# Patient Record
Sex: Female | Born: 1940 | Race: Black or African American | Hispanic: No | Marital: Single | Smoking: Former smoker
Health system: Southern US, Community
[De-identification: ages and names within clinical notes are randomized; demographics above are authoritative.]

## PROBLEM LIST (undated history)

## (undated) DIAGNOSIS — K2100 Gastro-esophageal reflux disease with esophagitis, without bleeding: Secondary | ICD-10-CM

## (undated) DIAGNOSIS — J301 Allergic rhinitis due to pollen: Secondary | ICD-10-CM

## (undated) DIAGNOSIS — K5730 Diverticulosis of large intestine without perforation or abscess without bleeding: Secondary | ICD-10-CM

## (undated) DIAGNOSIS — R197 Diarrhea, unspecified: Secondary | ICD-10-CM

## (undated) DIAGNOSIS — E785 Hyperlipidemia, unspecified: Secondary | ICD-10-CM

## (undated) DIAGNOSIS — H6120 Impacted cerumen, unspecified ear: Secondary | ICD-10-CM

## (undated) DIAGNOSIS — I10 Essential (primary) hypertension: Secondary | ICD-10-CM

## (undated) DIAGNOSIS — M25569 Pain in unspecified knee: Secondary | ICD-10-CM

## (undated) DIAGNOSIS — K5909 Other constipation: Secondary | ICD-10-CM

## (undated) DIAGNOSIS — E538 Deficiency of other specified B group vitamins: Secondary | ICD-10-CM

## (undated) DIAGNOSIS — L309 Dermatitis, unspecified: Secondary | ICD-10-CM

## (undated) DIAGNOSIS — R109 Unspecified abdominal pain: Secondary | ICD-10-CM

## (undated) DIAGNOSIS — G8929 Other chronic pain: Secondary | ICD-10-CM

## (undated) DIAGNOSIS — E1165 Type 2 diabetes mellitus with hyperglycemia: Secondary | ICD-10-CM

## (undated) DIAGNOSIS — F411 Generalized anxiety disorder: Secondary | ICD-10-CM

## (undated) DIAGNOSIS — L408 Other psoriasis: Secondary | ICD-10-CM

## (undated) DIAGNOSIS — M4000 Postural kyphosis, site unspecified: Secondary | ICD-10-CM

## (undated) DIAGNOSIS — M25519 Pain in unspecified shoulder: Secondary | ICD-10-CM

## (undated) DIAGNOSIS — S43006A Unspecified dislocation of unspecified shoulder joint, initial encounter: Secondary | ICD-10-CM

## (undated) DIAGNOSIS — M2560 Stiffness of unspecified joint, not elsewhere classified: Secondary | ICD-10-CM

## (undated) DIAGNOSIS — E119 Type 2 diabetes mellitus without complications: Secondary | ICD-10-CM

## (undated) DIAGNOSIS — Z2089 Contact with and (suspected) exposure to other communicable diseases: Secondary | ICD-10-CM

## (undated) DIAGNOSIS — F17200 Nicotine dependence, unspecified, uncomplicated: Secondary | ICD-10-CM

## (undated) DIAGNOSIS — J209 Acute bronchitis, unspecified: Secondary | ICD-10-CM

## (undated) DIAGNOSIS — Z9181 History of falling: Secondary | ICD-10-CM

## (undated) DIAGNOSIS — J019 Acute sinusitis, unspecified: Secondary | ICD-10-CM

## (undated) DIAGNOSIS — M170 Bilateral primary osteoarthritis of knee: Secondary | ICD-10-CM

## (undated) DIAGNOSIS — E876 Hypokalemia: Secondary | ICD-10-CM

## (undated) DIAGNOSIS — T7840XA Allergy, unspecified, initial encounter: Secondary | ICD-10-CM

## (undated) DIAGNOSIS — F0390 Unspecified dementia without behavioral disturbance: Secondary | ICD-10-CM

## (undated) DIAGNOSIS — N72 Inflammatory disease of cervix uteri: Secondary | ICD-10-CM

## (undated) DIAGNOSIS — D72829 Elevated white blood cell count, unspecified: Secondary | ICD-10-CM

## (undated) DIAGNOSIS — F0280 Dementia in other diseases classified elsewhere without behavioral disturbance: Secondary | ICD-10-CM

## (undated) DIAGNOSIS — R160 Hepatomegaly, not elsewhere classified: Secondary | ICD-10-CM

## (undated) DIAGNOSIS — H903 Sensorineural hearing loss, bilateral: Secondary | ICD-10-CM

## (undated) DIAGNOSIS — B3731 Acute candidiasis of vulva and vagina: Secondary | ICD-10-CM

## (undated) DIAGNOSIS — M25511 Pain in right shoulder: Secondary | ICD-10-CM

## (undated) DIAGNOSIS — H9319 Tinnitus, unspecified ear: Secondary | ICD-10-CM

## (undated) DIAGNOSIS — IMO0001 Reserved for inherently not codable concepts without codable children: Secondary | ICD-10-CM

## (undated) DIAGNOSIS — Z01818 Encounter for other preprocedural examination: Secondary | ICD-10-CM

## (undated) DIAGNOSIS — L299 Pruritus, unspecified: Secondary | ICD-10-CM

## (undated) DIAGNOSIS — Z720 Tobacco use: Secondary | ICD-10-CM

## (undated) DIAGNOSIS — R059 Cough, unspecified: Secondary | ICD-10-CM

## (undated) DIAGNOSIS — M6281 Muscle weakness (generalized): Secondary | ICD-10-CM

## (undated) DIAGNOSIS — E109 Type 1 diabetes mellitus without complications: Secondary | ICD-10-CM

## (undated) DIAGNOSIS — M810 Age-related osteoporosis without current pathological fracture: Secondary | ICD-10-CM

## (undated) DIAGNOSIS — R05 Cough: Secondary | ICD-10-CM

## (undated) DIAGNOSIS — L409 Psoriasis, unspecified: Secondary | ICD-10-CM

## (undated) DIAGNOSIS — F0150 Vascular dementia without behavioral disturbance: Secondary | ICD-10-CM

## (undated) DIAGNOSIS — R5383 Other fatigue: Secondary | ICD-10-CM

## (undated) DIAGNOSIS — R1084 Generalized abdominal pain: Secondary | ICD-10-CM

## (undated) DIAGNOSIS — B373 Candidiasis of vulva and vagina: Secondary | ICD-10-CM

## (undated) DIAGNOSIS — K5900 Constipation, unspecified: Secondary | ICD-10-CM

## (undated) DIAGNOSIS — R52 Pain, unspecified: Secondary | ICD-10-CM

## (undated) DIAGNOSIS — R5381 Other malaise: Secondary | ICD-10-CM

## (undated) DIAGNOSIS — R269 Unspecified abnormalities of gait and mobility: Secondary | ICD-10-CM

## (undated) DIAGNOSIS — F419 Anxiety disorder, unspecified: Secondary | ICD-10-CM

## (undated) DIAGNOSIS — M2550 Pain in unspecified joint: Secondary | ICD-10-CM

## (undated) DIAGNOSIS — K219 Gastro-esophageal reflux disease without esophagitis: Secondary | ICD-10-CM

## (undated) DIAGNOSIS — M25512 Pain in left shoulder: Secondary | ICD-10-CM

## (undated) DIAGNOSIS — F4310 Post-traumatic stress disorder, unspecified: Secondary | ICD-10-CM

## (undated) HISTORY — DX: Nicotine dependence, unspecified, uncomplicated: F17.200

## (undated) HISTORY — DX: Sensorineural hearing loss, bilateral: H90.3

## (undated) HISTORY — DX: Pain in left shoulder: M25.512

## (undated) HISTORY — DX: Muscle weakness (generalized): M62.81

## (undated) HISTORY — DX: Diverticulosis of large intestine without perforation or abscess without bleeding: K57.30

## (undated) HISTORY — DX: Generalized abdominal pain: R10.84

## (undated) HISTORY — DX: Unspecified dementia, unspecified severity, without behavioral disturbance, psychotic disturbance, mood disturbance, and anxiety: F03.90

## (undated) HISTORY — DX: Acute bronchitis, unspecified: J20.9

## (undated) HISTORY — DX: Age-related osteoporosis without current pathological fracture: M81.0

## (undated) HISTORY — DX: Constipation, unspecified: K59.00

## (undated) HISTORY — DX: Unspecified abdominal pain: R10.9

## (undated) HISTORY — DX: Type 2 diabetes mellitus without complications: E11.9

## (undated) HISTORY — DX: Unspecified abnormalities of gait and mobility: R26.9

## (undated) HISTORY — DX: Hypercalcemia: E83.52

## (undated) HISTORY — DX: Tobacco use: Z72.0

## (undated) HISTORY — DX: Generalized anxiety disorder: F41.1

## (undated) HISTORY — DX: Pain in unspecified shoulder: M25.519

## (undated) HISTORY — DX: Type 1 diabetes mellitus without complications: E10.9

## (undated) HISTORY — DX: Gastro-esophageal reflux disease with esophagitis, without bleeding: K21.00

## (undated) HISTORY — DX: Post-traumatic stress disorder, unspecified: F43.10

## (undated) HISTORY — DX: Tinnitus, unspecified ear: H93.19

## (undated) HISTORY — DX: Bilateral primary osteoarthritis of knee: M17.0

## (undated) HISTORY — DX: Pruritus, unspecified: L29.9

## (undated) HISTORY — DX: Other malaise: R53.81

## (undated) HISTORY — DX: History of falling: Z91.81

## (undated) HISTORY — DX: Other chronic pain: G89.29

## (undated) HISTORY — DX: Pain in unspecified knee: M25.569

## (undated) HISTORY — DX: Reserved for inherently not codable concepts without codable children: IMO0001

## (undated) HISTORY — DX: Elevated white blood cell count, unspecified: D72.829

## (undated) HISTORY — DX: Pain in right shoulder: M25.511

## (undated) HISTORY — DX: Inflammatory disease of cervix uteri: N72

## (undated) HISTORY — DX: Pain, unspecified: R52

## (undated) HISTORY — DX: Contact with and (suspected) exposure to other communicable diseases: Z20.89

## (undated) HISTORY — DX: Vascular dementia, unspecified severity, without behavioral disturbance, psychotic disturbance, mood disturbance, and anxiety: F01.50

## (undated) HISTORY — DX: Type 2 diabetes mellitus with hyperglycemia: E11.65

## (undated) HISTORY — DX: Pain in unspecified joint: M25.50

## (undated) HISTORY — DX: Cough: R05

## (undated) HISTORY — DX: Essential (primary) hypertension: I10

## (undated) HISTORY — DX: Other fatigue: R53.83

## (undated) HISTORY — DX: Psoriasis, unspecified: L40.9

## (undated) HISTORY — DX: Impacted cerumen, unspecified ear: H61.20

## (undated) HISTORY — DX: Other psoriasis: L40.8

## (undated) HISTORY — PX: BLADDER SURGERY: SHX569

## (undated) HISTORY — DX: Diarrhea, unspecified: R19.7

## (undated) HISTORY — DX: Gastro-esophageal reflux disease without esophagitis: K21.9

## (undated) HISTORY — DX: Deficiency of other specified B group vitamins: E53.8

## (undated) HISTORY — DX: Postural kyphosis, site unspecified: M40.00

## (undated) HISTORY — DX: Encounter for other preprocedural examination: Z01.818

## (undated) HISTORY — DX: Other constipation: K59.09

## (undated) HISTORY — DX: Hyperlipidemia, unspecified: E78.5

## (undated) HISTORY — DX: Allergy, unspecified, initial encounter: T78.40XA

## (undated) HISTORY — DX: Allergic rhinitis due to pollen: J30.1

## (undated) HISTORY — DX: Dermatitis, unspecified: L30.9

## (undated) HISTORY — DX: Cough, unspecified: R05.9

## (undated) HISTORY — DX: Unspecified dislocation of unspecified shoulder joint, initial encounter: S43.006A

## (undated) HISTORY — DX: Stiffness of unspecified joint, not elsewhere classified: M25.60

## (undated) HISTORY — DX: Acute sinusitis, unspecified: J01.90

## (undated) HISTORY — DX: Hypokalemia: E87.6

## (undated) HISTORY — DX: Dementia in other diseases classified elsewhere, unspecified severity, without behavioral disturbance, psychotic disturbance, mood disturbance, and anxiety: F02.80

## (undated) HISTORY — DX: Hepatomegaly, not elsewhere classified: R16.0

## (undated) HISTORY — DX: Anxiety disorder, unspecified: F41.9

## (undated) HISTORY — PX: SPINE SURGERY: SHX786

## (undated) HISTORY — PX: OTHER SURGICAL HISTORY: SHX169

## (undated) HISTORY — DX: Candidiasis of vulva and vagina: B37.3

## (undated) HISTORY — DX: Acute candidiasis of vulva and vagina: B37.31

## (undated) HISTORY — DX: Gastro-esophageal reflux disease with esophagitis: K21.0

---

## 2007-07-13 ENCOUNTER — Emergency Department (HOSPITAL_COMMUNITY): Admission: EM | Admit: 2007-07-13 | Discharge: 2007-07-14 | Payer: Self-pay | Admitting: Emergency Medicine

## 2007-09-02 ENCOUNTER — Ambulatory Visit (HOSPITAL_COMMUNITY): Admission: RE | Admit: 2007-09-02 | Discharge: 2007-09-02 | Payer: Self-pay | Admitting: *Deleted

## 2007-10-06 ENCOUNTER — Emergency Department (HOSPITAL_COMMUNITY): Admission: EM | Admit: 2007-10-06 | Discharge: 2007-10-06 | Payer: Self-pay | Admitting: Emergency Medicine

## 2007-10-13 ENCOUNTER — Encounter: Admission: RE | Admit: 2007-10-13 | Discharge: 2007-10-13 | Payer: Self-pay | Admitting: General Surgery

## 2007-12-01 ENCOUNTER — Emergency Department (HOSPITAL_COMMUNITY): Admission: EM | Admit: 2007-12-01 | Discharge: 2007-12-01 | Payer: Self-pay | Admitting: Emergency Medicine

## 2007-12-25 ENCOUNTER — Emergency Department (HOSPITAL_COMMUNITY): Admission: EM | Admit: 2007-12-25 | Discharge: 2007-12-25 | Payer: Self-pay | Admitting: Emergency Medicine

## 2008-01-10 ENCOUNTER — Emergency Department (HOSPITAL_COMMUNITY): Admission: EM | Admit: 2008-01-10 | Discharge: 2008-01-10 | Payer: Self-pay | Admitting: Emergency Medicine

## 2008-06-12 ENCOUNTER — Encounter: Admission: RE | Admit: 2008-06-12 | Discharge: 2008-06-12 | Payer: Self-pay | Admitting: *Deleted

## 2008-09-18 ENCOUNTER — Emergency Department (HOSPITAL_COMMUNITY): Admission: EM | Admit: 2008-09-18 | Discharge: 2008-09-18 | Payer: Self-pay | Admitting: Emergency Medicine

## 2008-11-20 ENCOUNTER — Encounter: Admission: RE | Admit: 2008-11-20 | Discharge: 2008-11-20 | Payer: Self-pay | Admitting: *Deleted

## 2009-09-24 ENCOUNTER — Ambulatory Visit: Payer: Self-pay | Admitting: Internal Medicine

## 2009-09-24 DIAGNOSIS — R0602 Shortness of breath: Secondary | ICD-10-CM

## 2010-07-08 ENCOUNTER — Encounter (INDEPENDENT_AMBULATORY_CARE_PROVIDER_SITE_OTHER): Payer: Self-pay | Admitting: Emergency Medicine

## 2010-07-08 ENCOUNTER — Ambulatory Visit: Payer: Self-pay | Admitting: Vascular Surgery

## 2010-07-08 ENCOUNTER — Emergency Department (HOSPITAL_COMMUNITY): Admission: EM | Admit: 2010-07-08 | Discharge: 2010-07-08 | Payer: Self-pay | Admitting: Emergency Medicine

## 2010-08-22 ENCOUNTER — Inpatient Hospital Stay (HOSPITAL_COMMUNITY): Admission: EM | Admit: 2010-08-22 | Discharge: 2010-08-25 | Payer: Self-pay | Admitting: Emergency Medicine

## 2010-08-30 ENCOUNTER — Encounter: Admission: RE | Admit: 2010-08-30 | Discharge: 2010-08-30 | Payer: Self-pay | Admitting: Internal Medicine

## 2010-09-03 ENCOUNTER — Encounter: Admission: RE | Admit: 2010-09-03 | Discharge: 2010-09-03 | Payer: Self-pay | Admitting: Internal Medicine

## 2010-09-12 ENCOUNTER — Encounter: Admission: RE | Admit: 2010-09-12 | Discharge: 2010-09-12 | Payer: Self-pay | Admitting: Internal Medicine

## 2011-01-22 LAB — COMPREHENSIVE METABOLIC PANEL
ALT: 25 U/L (ref 0–35)
AST: 20 U/L (ref 0–37)
AST: 30 U/L (ref 0–37)
Albumin: 3.1 g/dL — ABNORMAL LOW (ref 3.5–5.2)
Albumin: 3.3 g/dL — ABNORMAL LOW (ref 3.5–5.2)
Alkaline Phosphatase: 77 U/L (ref 39–117)
Alkaline Phosphatase: 83 U/L (ref 39–117)
BUN: 12 mg/dL (ref 6–23)
BUN: 9 mg/dL (ref 6–23)
CO2: 28 mEq/L (ref 19–32)
Calcium: 9.2 mg/dL (ref 8.4–10.5)
Chloride: 96 mEq/L (ref 96–112)
Chloride: 99 mEq/L (ref 96–112)
Creatinine, Ser: 0.54 mg/dL (ref 0.4–1.2)
GFR calc Af Amer: >60 mL/min (ref >60)
GFR calc non Af Amer: >60 mL/min (ref >60)
Glucose, Bld: 105 mg/dL — ABNORMAL HIGH (ref 70–99)
Potassium: 4.2 mEq/L (ref 3.5–5.1)
Potassium: 4.9 mEq/L (ref 3.5–5.1)
Sodium: 129 mEq/L — ABNORMAL LOW (ref 135–145)
Total Bilirubin: 0.6 mg/dL (ref 0.3–1.2)
Total Bilirubin: 0.8 mg/dL (ref 0.3–1.2)
Total Protein: 6.8 g/dL (ref 6.0–8.3)

## 2011-01-22 LAB — DIFFERENTIAL
Basophils Absolute: 0 10*3/uL (ref 0.0–0.1)
Basophils Absolute: 0 10*3/uL (ref 0.0–0.1)
Basophils Relative: 0 % (ref 0–1)
Basophils Relative: 0 % (ref 0–1)
Eosinophils Absolute: 0 10*3/uL (ref 0.0–0.7)
Eosinophils Relative: 0 % (ref 0–5)
Eosinophils Relative: 1 % (ref 0–5)
Lymphocytes Relative: 54 % — ABNORMAL HIGH (ref 12–46)
Lymphs Abs: 5.4 10*3/uL — ABNORMAL HIGH (ref 0.7–4.0)
Monocytes Absolute: 0.5 10*3/uL (ref 0.1–1.0)
Monocytes Absolute: 0.6 10*3/uL (ref 0.1–1.0)
Monocytes Relative: 6 % (ref 3–12)
Neutro Abs: 4 10*3/uL (ref 1.7–7.7)
Neutro Abs: 4.1 10*3/uL (ref 1.7–7.7)
Neutrophils Relative %: 39 % — ABNORMAL LOW (ref 43–77)

## 2011-01-22 LAB — CBC
HCT: 40.7 % (ref 36.0–46.0)
Hemoglobin: 13.5 g/dL (ref 12.0–15.0)
Hemoglobin: 13.6 g/dL (ref 12.0–15.0)
MCH: 30.4 pg (ref 26.0–34.0)
MCHC: 33.3 g/dL (ref 30.0–36.0)
MCV: 91.2 fL (ref 78.0–100.0)
MCV: 91.5 fL (ref 78.0–100.0)
Platelets: 190 10*3/uL (ref 150–400)
Platelets: 195 10*3/uL (ref 150–400)
RBC: 4.34 MIL/uL (ref 3.87–5.11)
RBC: 4.46 MIL/uL (ref 3.87–5.11)
RDW: 15.7 % — ABNORMAL HIGH (ref 11.5–15.5)
WBC: 10.1 10*3/uL (ref 4.0–10.5)
WBC: 9.4 10*3/uL (ref 4.0–10.5)

## 2011-01-22 LAB — GLUCOSE, CAPILLARY
Glucose-Capillary: 101 mg/dL — ABNORMAL HIGH (ref 70–99)
Glucose-Capillary: 105 mg/dL — ABNORMAL HIGH (ref 70–99)
Glucose-Capillary: 124 mg/dL — ABNORMAL HIGH (ref 70–99)
Glucose-Capillary: 137 mg/dL — ABNORMAL HIGH (ref 70–99)

## 2011-01-22 LAB — MAGNESIUM: Magnesium: 2.5 mg/dL (ref 1.5–2.5)

## 2011-01-23 LAB — BASIC METABOLIC PANEL
BUN: 12 mg/dL (ref 6–23)
CO2: 29 mEq/L (ref 19–32)
Chloride: 87 mEq/L — ABNORMAL LOW (ref 96–112)
Glucose, Bld: 102 mg/dL — ABNORMAL HIGH (ref 70–99)
Potassium: 4.1 mEq/L (ref 3.5–5.1)

## 2011-01-23 LAB — GLUCOSE, CAPILLARY
Glucose-Capillary: 107 mg/dL — ABNORMAL HIGH (ref 70–99)
Glucose-Capillary: 168 mg/dL — ABNORMAL HIGH (ref 70–99)
Glucose-Capillary: 89 mg/dL (ref 70–99)
Glucose-Capillary: 98 mg/dL (ref 70–99)

## 2011-01-23 LAB — CBC
HCT: 40.3 % (ref 36.0–46.0)
Hemoglobin: 13.6 g/dL (ref 12.0–15.0)
MCH: 31 pg (ref 26.0–34.0)
MCHC: 33.8 g/dL (ref 30.0–36.0)
MCV: 91.6 fL (ref 78.0–100.0)

## 2011-01-23 LAB — URINALYSIS, ROUTINE W REFLEX MICROSCOPIC
Bilirubin Urine: NEGATIVE
Hgb urine dipstick: NEGATIVE
Ketones, ur: NEGATIVE mg/dL
Ketones, ur: NEGATIVE mg/dL
Nitrite: NEGATIVE
Nitrite: NEGATIVE
Protein, ur: NEGATIVE mg/dL
pH: 7 (ref 5.0–8.0)

## 2011-01-23 LAB — DIFFERENTIAL
Basophils Absolute: 0 10*3/uL (ref 0.0–0.1)
Eosinophils Absolute: 0 10*3/uL (ref 0.0–0.7)
Lymphocytes Relative: 53 % — ABNORMAL HIGH (ref 12–46)
Neutro Abs: 5 10*3/uL (ref 1.7–7.7)
Neutrophils Relative %: 42 % — ABNORMAL LOW (ref 43–77)

## 2011-01-23 LAB — COMPREHENSIVE METABOLIC PANEL
ALT: 22 U/L (ref 0–35)
CO2: 28 mEq/L (ref 19–32)
Calcium: 9.6 mg/dL (ref 8.4–10.5)
Creatinine, Ser: 0.52 mg/dL (ref 0.4–1.2)
GFR calc non Af Amer: >60 mL/min (ref >60)
Glucose, Bld: 102 mg/dL — ABNORMAL HIGH (ref 70–99)

## 2011-01-23 LAB — SEDIMENTATION RATE: Sed Rate: 2 mm/hr (ref 0–22)

## 2011-01-23 LAB — LIPID PANEL
Cholesterol: 143 mg/dL (ref 0–200)
LDL Cholesterol: 53 mg/dL (ref 0–99)
Total CHOL/HDL Ratio: 2.1 RATIO

## 2011-03-10 ENCOUNTER — Other Ambulatory Visit: Payer: Self-pay | Admitting: Internal Medicine

## 2011-03-10 DIAGNOSIS — Z780 Asymptomatic menopausal state: Secondary | ICD-10-CM

## 2011-03-12 ENCOUNTER — Other Ambulatory Visit: Payer: Self-pay

## 2011-03-19 ENCOUNTER — Ambulatory Visit
Admission: RE | Admit: 2011-03-19 | Discharge: 2011-03-19 | Disposition: A | Discharge status: Home / Self Care | Payer: Medicare Other | Source: Ambulatory Visit | Attending: Internal Medicine | Admitting: Internal Medicine

## 2011-03-19 DIAGNOSIS — Z780 Asymptomatic menopausal state: Secondary | ICD-10-CM

## 2011-07-31 LAB — I-STAT 8, (EC8 V) (CONVERTED LAB)
Acid-Base Excess: 2
Chloride: 96
HCT: 48 — ABNORMAL HIGH
Operator id: 146091
Potassium: 3.6
Sodium: 128 — ABNORMAL LOW

## 2011-07-31 LAB — DIFFERENTIAL
Basophils Absolute: 0
Basophils Relative: 0
Monocytes Relative: 5
Neutro Abs: 4.9
Neutrophils Relative %: 51

## 2011-07-31 LAB — POCT CARDIAC MARKERS
CKMB, poc: 1.8
Myoglobin, poc: 61.9

## 2011-07-31 LAB — BASIC METABOLIC PANEL
CO2: 24
Calcium: 9.7
Creatinine, Ser: 0.6
GFR calc Af Amer: >60
GFR calc non Af Amer: >60

## 2011-07-31 LAB — URINALYSIS, ROUTINE W REFLEX MICROSCOPIC
Bilirubin Urine: NEGATIVE
Glucose, UA: NEGATIVE
Ketones, ur: NEGATIVE
Urobilinogen, UA: 1
pH: 7

## 2011-07-31 LAB — CBC
MCHC: 32.9
RBC: 4.9

## 2011-07-31 LAB — URINE MICROSCOPIC-ADD ON

## 2011-07-31 LAB — B-NATRIURETIC PEPTIDE (CONVERTED LAB): Pro B Natriuretic peptide (BNP): <30

## 2011-08-04 ENCOUNTER — Other Ambulatory Visit: Payer: Self-pay | Admitting: Internal Medicine

## 2011-08-04 DIAGNOSIS — Z1231 Encounter for screening mammogram for malignant neoplasm of breast: Secondary | ICD-10-CM

## 2011-08-04 LAB — URINALYSIS, ROUTINE W REFLEX MICROSCOPIC
Ketones, ur: NEGATIVE
Nitrite: NEGATIVE
Protein, ur: NEGATIVE

## 2011-08-04 LAB — COMPREHENSIVE METABOLIC PANEL
Albumin: 3.2 — ABNORMAL LOW
Alkaline Phosphatase: 136 — ABNORMAL HIGH
BUN: 6
Calcium: 9.2
Potassium: 3.8
Total Protein: 7

## 2011-08-04 LAB — DIFFERENTIAL
Basophils Relative: 1
Lymphocytes Relative: 43
Lymphs Abs: 4.1 — ABNORMAL HIGH
Monocytes Absolute: 0.4
Monocytes Relative: 5
Neutro Abs: 4.8

## 2011-08-04 LAB — CBC
HCT: 38.5
MCHC: 34.2
Platelets: 246
RDW: 15.8 — ABNORMAL HIGH

## 2011-08-12 LAB — COMPREHENSIVE METABOLIC PANEL
ALT: 12
Alkaline Phosphatase: 118 — ABNORMAL HIGH
BUN: 6
CO2: 31
Chloride: 97
GFR calc non Af Amer: >60
Glucose, Bld: 230 — ABNORMAL HIGH
Potassium: 3.8
Sodium: 134 — ABNORMAL LOW
Total Bilirubin: 0.4

## 2011-08-19 LAB — POCT URINALYSIS DIP (DEVICE)
Glucose, UA: NEGATIVE
Nitrite: NEGATIVE
Operator id: 239701
Protein, ur: NEGATIVE
Urobilinogen, UA: 0.2

## 2011-08-22 LAB — URINALYSIS, ROUTINE W REFLEX MICROSCOPIC
Bilirubin Urine: NEGATIVE
Glucose, UA: NEGATIVE
Glucose, UA: NEGATIVE
Hgb urine dipstick: NEGATIVE
Ketones, ur: NEGATIVE
Ketones, ur: NEGATIVE
Nitrite: NEGATIVE
Protein, ur: NEGATIVE
Specific Gravity, Urine: 1.007
Urobilinogen, UA: 0.2
pH: 7

## 2011-08-22 LAB — COMPREHENSIVE METABOLIC PANEL
ALT: 13
AST: 15
Albumin: 3.4 — ABNORMAL LOW
CO2: 28
Calcium: 9.8
Creatinine, Ser: 0.64
GFR calc Af Amer: >60
Sodium: 138
Total Protein: 7.2

## 2011-08-22 LAB — DIFFERENTIAL
Eosinophils Absolute: 0.2
Eosinophils Relative: 3
Lymphocytes Relative: 44
Lymphs Abs: 4 — ABNORMAL HIGH
Monocytes Absolute: 0.7
Monocytes Relative: 7

## 2011-08-22 LAB — URINE CULTURE
Colony Count: NO GROWTH
Culture: NO GROWTH

## 2011-08-22 LAB — CBC
MCHC: 33.8
MCV: 90.5
Platelets: 229
RBC: 4.47
WBC: 9.1

## 2011-08-22 LAB — URINE MICROSCOPIC-ADD ON

## 2011-08-22 LAB — LIPASE, BLOOD: Lipase: 26

## 2011-09-16 ENCOUNTER — Ambulatory Visit
Admission: RE | Admit: 2011-09-16 | Discharge: 2011-09-16 | Disposition: A | Discharge status: Home / Self Care | Payer: Medicare Other | Source: Ambulatory Visit | Attending: Internal Medicine | Admitting: Internal Medicine

## 2011-09-16 DIAGNOSIS — Z1231 Encounter for screening mammogram for malignant neoplasm of breast: Secondary | ICD-10-CM

## 2012-05-24 ENCOUNTER — Other Ambulatory Visit: Payer: Self-pay | Admitting: Nurse Practitioner

## 2012-05-24 ENCOUNTER — Ambulatory Visit
Admission: RE | Admit: 2012-05-24 | Discharge: 2012-05-24 | Disposition: A | Discharge status: Home / Self Care | Payer: Medicare Other | Source: Ambulatory Visit | Attending: Nurse Practitioner | Admitting: Nurse Practitioner

## 2012-05-24 DIAGNOSIS — G8929 Other chronic pain: Secondary | ICD-10-CM

## 2012-06-22 ENCOUNTER — Emergency Department (HOSPITAL_COMMUNITY)
Admission: EM | Admit: 2012-06-22 | Discharge: 2012-06-22 | Disposition: A | Discharge status: Home / Self Care | Payer: Medicare Other | Attending: Emergency Medicine | Admitting: Emergency Medicine

## 2012-06-22 ENCOUNTER — Emergency Department (HOSPITAL_COMMUNITY): Payer: Medicare Other

## 2012-06-22 ENCOUNTER — Encounter (HOSPITAL_COMMUNITY): Payer: Self-pay | Admitting: Emergency Medicine

## 2012-06-22 DIAGNOSIS — R269 Unspecified abnormalities of gait and mobility: Secondary | ICD-10-CM | POA: Insufficient documentation

## 2012-06-22 DIAGNOSIS — R531 Weakness: Secondary | ICD-10-CM

## 2012-06-22 DIAGNOSIS — R5381 Other malaise: Secondary | ICD-10-CM | POA: Insufficient documentation

## 2012-06-22 LAB — COMPREHENSIVE METABOLIC PANEL
AST: 20 U/L (ref 0–37)
BUN: 22 mg/dL (ref 6–23)
CO2: 29 mEq/L (ref 19–32)
Calcium: 9.6 mg/dL (ref 8.4–10.5)
Chloride: 102 mEq/L (ref 96–112)
Creatinine, Ser: 1.08 mg/dL (ref 0.50–1.10)
GFR calc non Af Amer: 50 mL/min — ABNORMAL LOW (ref >90)
Total Bilirubin: 0.2 mg/dL — ABNORMAL LOW (ref 0.3–1.2)

## 2012-06-22 LAB — CBC WITH DIFFERENTIAL/PLATELET
Basophils Absolute: 0 10*3/uL (ref 0.0–0.1)
Basophils Relative: 0 % (ref 0–1)
Eosinophils Relative: 3 % (ref 0–5)
HCT: 37.2 % (ref 36.0–46.0)
Hemoglobin: 12.3 g/dL (ref 12.0–15.0)
Lymphocytes Relative: 52 % — ABNORMAL HIGH (ref 12–46)
MCHC: 33.1 g/dL (ref 30.0–36.0)
MCV: 89 fL (ref 78.0–100.0)
Monocytes Absolute: 0.6 10*3/uL (ref 0.1–1.0)
Monocytes Relative: 6 % (ref 3–12)
RDW: 15.5 % (ref 11.5–15.5)

## 2012-06-22 LAB — URINALYSIS, ROUTINE W REFLEX MICROSCOPIC
Bilirubin Urine: NEGATIVE
Glucose, UA: NEGATIVE mg/dL
Hgb urine dipstick: NEGATIVE
Ketones, ur: NEGATIVE mg/dL
Protein, ur: NEGATIVE mg/dL
pH: 5.5 (ref 5.0–8.0)

## 2012-06-22 LAB — CK: Total CK: 302 U/L — ABNORMAL HIGH (ref 7–177)

## 2012-06-22 LAB — POCT I-STAT TROPONIN I: Troponin i, poc: 0.01 ng/mL (ref 0.00–0.08)

## 2012-06-22 NOTE — ED Notes (Signed)
Pt lives alone from home. CNA called EMS because patient has been more lethargic and weak over the last two days. Pt A&Ox4 on arrival, denies pain. 20g Lhand.  CBG 96

## 2012-06-22 NOTE — ED Notes (Signed)
[facility] contacted for transport 

## 2012-06-22 NOTE — ED Provider Notes (Signed)
History     CSN: [id number]  Arrival date & time 06/22/12  1407   First MD Initiated Contact with Patient 06/22/12 1408      Chief Complaint  Patient presents with  . Weakness    (Consider location/radiation/quality/duration/timing/severity/associated sxs/prior treatment) HPI Comments: Amanda Adkins is a 71 y.o. Female who presents with complaint of weakness. Pt lives at home, and has a nurse come by the house daily. She was concerned today, because according to her, pt has been more weak for the last two days. Pt states she is normally able to walk with her walker but for the last two days, has been having more difficult time. States that yesterday evening she fell down, and was unable to get up until was found on the floor by her nurse today. Pt states she did not hit her head or injure herself during the fall. Pt denies pain anywhere. States she does not know why she is here.          History reviewed. No pertinent past medical history.  History reviewed. No pertinent past surgical history.  History reviewed. No pertinent family history.  History  Substance Use Topics  . Smoking status: Not on file  . Smokeless tobacco: Not on file  . Alcohol Use: Not on file    OB History    Grav Para Term Preterm Abortions TAB SAB Ect Mult Living                  Review of Systems  Constitutional: Negative for fever and chills.  HENT: Negative for neck pain and neck stiffness.   Respiratory: Negative.   Cardiovascular: Negative.   Gastrointestinal: Negative.   Genitourinary: Negative for dysuria and flank pain.  Musculoskeletal: Positive for gait problem. Negative for myalgias and arthralgias.  Skin: Negative.   Neurological: Positive for weakness. Negative for dizziness, speech difficulty, numbness and headaches.    Allergies  Buspar  Home Medications   Current Outpatient Rx  Name Route Sig Dispense Refill  . OVER THE COUNTER MEDICATION Oral Take 1 tablet by mouth 2  (two) times daily as needed. Sinus pressure      BP 113/59  Pulse 82  Resp 20  SpO2 97%  Physical Exam  Nursing note and vitals reviewed. Constitutional: She is oriented to person, place, and time. She appears well-developed and well-nourished. No distress.  HENT:  Head: Normocephalic.  Eyes: Conjunctivae and EOM are normal.       Pin point pupils  Neck: Normal range of motion. Neck supple.  Cardiovascular: Normal rate, regular rhythm and normal heart sounds.   Pulmonary/Chest: Effort normal and breath sounds normal. No respiratory distress. She has no wheezes.  Abdominal: Soft. Bowel sounds are normal. She exhibits no distension. There is no tenderness. There is no rebound.  Musculoskeletal: She exhibits no edema.       Full ROM of bilateral hips. No tenderness over pelvis. Pain with bilateral shoulder ROM, limmited ROm due to pain, chronic per pt  Neurological: She is alert and oriented to person, place, and time. No cranial nerve deficit. Coordination normal.  Skin: Skin is warm and dry.  Psychiatric: She has a normal mood and affect.    ED Course  Procedures (including critical care time)  Pt with generalized weakness, fall last night. No specific complaints. Exam show pin point pupils, otherwise unremarkable. Will get labs, CT head, UA.   Date: 06/22/2012  Rate: 85  Rhythm: normal sinus rhythm  QRS  Axis: normal  Intervals: QT prolonged  ST/T Wave abnormalities: nonspecific T wave changes  Conduction Disutrbances:none  Narrative Interpretation:   Old EKG Reviewed: changes noted prolonged QT today  Results for orders placed during the hospital encounter of 06/22/12  URINALYSIS, ROUTINE W REFLEX MICROSCOPIC      Component Value Range   Color, Urine YELLOW  YELLOW   APPearance CLEAR  CLEAR   Specific Gravity, Urine 1.017  1.005 - 1.030   pH 5.5  5.0 - 8.0   Glucose, UA NEGATIVE  NEGATIVE mg/dL   Hgb urine dipstick NEGATIVE  NEGATIVE   Bilirubin Urine NEGATIVE   NEGATIVE   Ketones, ur NEGATIVE  NEGATIVE mg/dL   Protein, ur NEGATIVE  NEGATIVE mg/dL   Urobilinogen, UA 0.2  0.0 - 1.0 mg/dL   Nitrite NEGATIVE  NEGATIVE   Leukocytes, UA NEGATIVE  NEGATIVE  CBC WITH DIFFERENTIAL      Component Value Range   WBC 9.9  4.0 - 10.5 K/uL   RBC 4.18  3.87 - 5.11 MIL/uL   Hemoglobin 12.3  12.0 - 15.0 g/dL   HCT 16.1  09.6 - 04.5 %   MCV 89.0  78.0 - 100.0 fL   MCH 29.4  26.0 - 34.0 pg   MCHC 33.1  30.0 - 36.0 g/dL   RDW 40.9  81.1 - 91.4 %   Platelets 188  150 - 400 K/uL   Neutrophils Relative 39 (*) 43 - 77 %   Neutro Abs 3.9  1.7 - 7.7 K/uL   Lymphocytes Relative 52 (*) 12 - 46 %   Lymphs Abs 5.1 (*) 0.7 - 4.0 K/uL   Monocytes Relative 6  3 - 12 %   Monocytes Absolute 0.6  0.1 - 1.0 K/uL   Eosinophils Relative 3  0 - 5 %   Eosinophils Absolute 0.3  0.0 - 0.7 K/uL   Basophils Relative 0  0 - 1 %   Basophils Absolute 0.0  0.0 - 0.1 K/uL  COMPREHENSIVE METABOLIC PANEL      Component Value Range   Sodium 142  135 - 145 mEq/L   Potassium 3.4 (*) 3.5 - 5.1 mEq/L   Chloride 102  96 - 112 mEq/L   CO2 29  19 - 32 mEq/L   Glucose, Bld 123 (*) 70 - 99 mg/dL   BUN 22  6 - 23 mg/dL   Creatinine, Ser 7.82  0.50 - 1.10 mg/dL   Calcium 9.6  8.4 - 95.6 mg/dL   Total Protein 7.2  6.0 - 8.3 g/dL   Albumin 3.3 (*) 3.5 - 5.2 g/dL   AST 20  0 - 37 U/L   ALT 29  0 - 35 U/L   Alkaline Phosphatase 114  39 - 117 U/L   Total Bilirubin 0.2 (*) 0.3 - 1.2 mg/dL   GFR calc non Af Amer 50 (*) >90 mL/min   GFR calc Af Amer 58 (*) >90 mL/min  POCT I-STAT TROPONIN I      Component Value Range   Troponin i, poc 0.01  0.00 - 0.08 ng/mL   Comment 3           CK      Component Value Range   Total CK 302 (*) 7 - 177 U/L   Dg Chest 2 View  06/22/2012  *RADIOLOGY REPORT*  Clinical Data: Lethargy and weakness.  CHEST - 2 VIEW  Comparison: 12/01/2007  Findings: Bibasilar atelectasis present without evidence of edema, focal consolidation or  pleural effusion.  The heart size is  stable. There is stable mild tortuosity of the thoracic aorta.  IMPRESSION: Bibasilar atelectasis.  Original Report Authenticated By: Reola Calkins, M.D.   Ct Head Wo Contrast  06/22/2012  *RADIOLOGY REPORT*  Clinical Data:  Increasing lethargy and weakness.  CT HEAD WITHOUT CONTRAST  Technique:  Contiguous axial images were obtained from the base of the skull through the vertex without contrast.  Comparison: 08/22/2010  Findings: Stable atrophy and small vessel disease. The brain demonstrates no evidence of hemorrhage, infarction, edema, mass effect, extra-axial fluid collection, hydrocephalus or mass lesion. The skull is unremarkable.  IMPRESSION: Stable atrophy and small vessel disease.  Original Report Authenticated By: Reola Calkins, M.D.    PT with no findings on CT, CXR, labs. Possible medication misuse. Pt AAOx3, wants to go home. Family by bedside. Will d/c home with follow up. No medical necessity for admission at this time. Recommended close follow up with pcp for further evaluation and management of pt's symptoms.   Filed Vitals:   06/22/12 1722  BP: 110/74  Pulse: 85  Temp: 98.3 F (36.8 C)  Resp: 16    1. Weakness       MDM  Pt with generalized weakness, no specific complaints. No signs of exam or CT evidence of stroke, negative labs, VS normal, no signs of infection. Pt stable for d/c home and outpatient follow up.           Lottie Mussel, PA 06/24/12 [phone]

## 2012-06-24 NOTE — ED Provider Notes (Signed)
Medical screening examination/treatment/procedure(s) were performed by non-physician practitioner and as supervising physician I was immediately available for consultation/collaboration.  Jennings Corado R. Meira Wahba, MD 06/24/12 0650 

## 2012-12-06 ENCOUNTER — Other Ambulatory Visit: Payer: Self-pay | Admitting: Nurse Practitioner

## 2012-12-06 DIAGNOSIS — R109 Unspecified abdominal pain: Secondary | ICD-10-CM

## 2012-12-08 ENCOUNTER — Inpatient Hospital Stay
Admission: RE | Admit: 2012-12-08 | Discharge: 2012-12-08 | Payer: Medicare Other | Source: Ambulatory Visit | Attending: Nurse Practitioner | Admitting: Nurse Practitioner

## 2012-12-15 ENCOUNTER — Inpatient Hospital Stay
Admission: RE | Admit: 2012-12-15 | Discharge: 2012-12-15 | Disposition: A | Discharge status: Home / Self Care | Payer: Medicare Other | Source: Ambulatory Visit | Attending: Nurse Practitioner | Admitting: Nurse Practitioner

## 2012-12-15 ENCOUNTER — Ambulatory Visit
Admission: RE | Admit: 2012-12-15 | Discharge: 2012-12-15 | Disposition: A | Discharge status: Home / Self Care | Payer: Medicare Other | Source: Ambulatory Visit | Attending: Nurse Practitioner | Admitting: Nurse Practitioner

## 2012-12-15 DIAGNOSIS — R109 Unspecified abdominal pain: Secondary | ICD-10-CM

## 2012-12-15 MED ORDER — IOHEXOL 300 MG/ML  SOLN
100.0000 mL | Freq: Once | INTRAMUSCULAR | Status: AC | PRN
  Administered 2012-12-15: 100 mL via INTRAVENOUS

## 2013-02-10 ENCOUNTER — Other Ambulatory Visit: Payer: Self-pay | Admitting: Internal Medicine

## 2013-02-11 ENCOUNTER — Ambulatory Visit: Payer: Self-pay | Admitting: Internal Medicine

## 2013-02-11 ENCOUNTER — Telehealth: Payer: Self-pay | Admitting: *Deleted

## 2013-02-11 NOTE — Telephone Encounter (Signed)
[facility] [phone]  Called and wanted verbal orders to continue going out to see patient to fill her pill box and getting her incontinence supplies. I told her that was fine.

## 2013-02-14 ENCOUNTER — Telehealth: Payer: Self-pay | Admitting: *Deleted

## 2013-02-14 NOTE — Telephone Encounter (Signed)
Beth ([facility]), Called and stated that patient left eye is red. Patient  GI doctor put her on some medication that make her have a bowel movement 5 times a day. I told beth that patient needs to call the GI doctor and address that issue with them. As fast  as the redness in the eye goes patient would need an office visit . She stated that she would a alert patient

## 2013-02-15 DIAGNOSIS — M2550 Pain in unspecified joint: Secondary | ICD-10-CM

## 2013-02-15 DIAGNOSIS — I10 Essential (primary) hypertension: Secondary | ICD-10-CM

## 2013-02-15 DIAGNOSIS — E119 Type 2 diabetes mellitus without complications: Secondary | ICD-10-CM

## 2013-02-15 DIAGNOSIS — R32 Unspecified urinary incontinence: Secondary | ICD-10-CM

## 2013-02-18 NOTE — Telephone Encounter (Signed)
Per Dr Renato Gails patient should let her GI doctor handle her bowel problems and for her eye she needs an appointment. Called Amanda Adkins and she stated that it is getting better but it is better. She also stated that her GI doctor told her to keep taking the medication

## 2013-02-25 ENCOUNTER — Encounter: Payer: Self-pay | Admitting: *Deleted

## 2013-02-28 ENCOUNTER — Encounter: Payer: Self-pay | Admitting: Internal Medicine

## 2013-02-28 ENCOUNTER — Ambulatory Visit (INDEPENDENT_AMBULATORY_CARE_PROVIDER_SITE_OTHER): Payer: Medicare Other | Admitting: Internal Medicine

## 2013-02-28 VITALS — BP 130/82 | HR 80 | Temp 98.3°F | Resp 20 | Ht 62.0 in | Wt 191.0 lb

## 2013-02-28 DIAGNOSIS — E1149 Type 2 diabetes mellitus with other diabetic neurological complication: Secondary | ICD-10-CM

## 2013-02-28 DIAGNOSIS — F17200 Nicotine dependence, unspecified, uncomplicated: Secondary | ICD-10-CM

## 2013-02-28 DIAGNOSIS — M170 Bilateral primary osteoarthritis of knee: Secondary | ICD-10-CM

## 2013-02-28 DIAGNOSIS — F0150 Vascular dementia without behavioral disturbance: Secondary | ICD-10-CM | POA: Insufficient documentation

## 2013-02-28 DIAGNOSIS — M2550 Pain in unspecified joint: Secondary | ICD-10-CM

## 2013-02-28 DIAGNOSIS — I10 Essential (primary) hypertension: Secondary | ICD-10-CM | POA: Insufficient documentation

## 2013-02-28 DIAGNOSIS — IMO0002 Reserved for concepts with insufficient information to code with codable children: Secondary | ICD-10-CM

## 2013-02-28 DIAGNOSIS — M1710 Unilateral primary osteoarthritis, unspecified knee: Secondary | ICD-10-CM

## 2013-02-28 DIAGNOSIS — M25519 Pain in unspecified shoulder: Secondary | ICD-10-CM

## 2013-02-28 DIAGNOSIS — S43006S Unspecified dislocation of unspecified shoulder joint, sequela: Secondary | ICD-10-CM

## 2013-02-28 DIAGNOSIS — G8929 Other chronic pain: Secondary | ICD-10-CM | POA: Insufficient documentation

## 2013-02-28 DIAGNOSIS — S43006A Unspecified dislocation of unspecified shoulder joint, initial encounter: Secondary | ICD-10-CM | POA: Insufficient documentation

## 2013-02-28 DIAGNOSIS — Z720 Tobacco use: Secondary | ICD-10-CM | POA: Insufficient documentation

## 2013-02-28 DIAGNOSIS — M2560 Stiffness of unspecified joint, not elsewhere classified: Secondary | ICD-10-CM

## 2013-02-28 MED ORDER — CALCIPOTRIENE 0.005 % EX SOLN: CUTANEOUS | Status: DC

## 2013-02-28 NOTE — Progress Notes (Signed)
Patient ID: Amanda Adkins, female   DOB: 03-24-1941, 72 y.o.   MRN: [id number] Code Status: DNR  Allergies  Allergen Reactions  . Buspar (Buspirone) Hives  . Percocet (Oxycodone-Acetaminophen)     Hallucination  . Vicodin (Hydrocodone-Acetaminophen)     hallucination    Chief Complaint  Patient presents with  . Medical Managment of Chronic Issues    mobility assessment    HPI: Patient is a 72 y.o. AA female seen in the office today for mobility assessment.  Amanda Adkins needs to use walker at all times at this point about 50 feet maximum.  Her ability to function has been declining.  She still has falls.  MRADLs are limited including bathing, dressing, grooming, and toileting.  She requires help with these.  She is able to feed herself without help.    She is unable to safely ambulate with a walker b/c she falls anyway.  She has an unsteady gait and has fallen more than 3 times in the past month.  Her balance is poor.    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  She also has severe pain 10/10 at times in her shoulders.    She has edema in her ankles and feet.  A scooter is not adequate due to inability to operate the tiller of the POV. She also does not live in a large enough environment to adequately get around with such a large device.   A PWC will improve her ability to get around her home by helping her get from the bed to toilet or bathtub w/o falling and throughout her home.    I believe she will be able to safely operate the power wheelchair within her home.  She is willing and motivated to use the power wheelchair.    Review of Systems:  Review of Systems  Constitutional: Negative for fever, chills and malaise/fatigue.  HENT: Positive for hearing loss. Negative for congestion.   Eyes: Negative for blurred vision.  Respiratory: Positive for cough, sputum production  and shortness of breath. Negative for wheezing.   Cardiovascular: Positive for leg swelling. Negative for chest pain and palpitations.  Gastrointestinal: Positive for heartburn, nausea, vomiting, abdominal pain and constipation. Negative for diarrhea, blood in stool and melena.  Genitourinary: Negative for dysuria.  Musculoskeletal: Positive for myalgias, back pain, joint pain and falls.  Skin: Negative for rash.  Neurological: Positive for dizziness, tingling, sensory change and focal weakness. Negative for tremors, loss of consciousness, weakness and headaches.  Endo/Heme/Allergies: Does not bruise/bleed easily.  Psychiatric/Behavioral: Positive for depression and memory loss. Negative for hallucinations. The patient is nervous/anxious and has insomnia.      Past Medical History  Diagnosis Date  . Diabetes mellitus without complication   . Anxiety   . GERD (gastroesophageal reflux disease)   . Vitamin B12 deficiency   . Hyperlipidemia   . Tobacco use disorder   . Posttraumatic stress disorder   . Hypertension   . Allergy   . Allergic rhinitis due to pollen   . Osteoporosis   . Other malaise and fatigue   . Abnormality of gait   . Hepatomegaly   . History of fall   . Muscle weakness (generalized)   . Diarrhea   . Unspecified constipation   . Abdominal pain, generalized   . Kyphosis (acquired) (postural)   . Posttraumatic stress disorder   .  Other psoriasis   . Abnormality of gait   . Other B-complex deficiencies   . Dementia in conditions classified elsewhere without behavioral disturbance(294.10)   . Pain   . Other malaise and fatigue   . Personal history of fall   . Hypercalcemia   . Senile osteoporosis   . Cervicitis and endocervicitis   . Unspecified pruritic disorder   . Closed dislocation of shoulder, unspecified site   . Leukocytosis, unspecified   . Other specified pre-operative examination   . Abdominal pain, generalized   . Tinnitus   . Pain in joint,  shoulder region   . Pain in joint, lower leg   . Impacted cerumen   . Diarrhea   . Hypopotassemia   . Diverticulosis of colon (without mention of hemorrhage)   . Tobacco use disorder   . Reflux esophagitis   . Cough   . Abnormality of gait   . Acute bronchitis   . 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 Hepatomegaly   . Dermatitis   . Senile dementia, uncomplicated   . Acute sinusitis, unspecified   . Type II or unspecified type diabetes mellitus without mention of complication, uncontrolled   . Contact with or exposure to other communicable diseases(V01.89)   . Candidiasis of vulva and vagina   . Type II or unspecified type diabetes mellitus without mention of complication, not stated as uncontrolled   . Other and unspecified hyperlipidemia   . Anxiety state, unspecified   . Unspecified essential hypertension   . Allergic rhinitis due to pollen   . Pain in joint, site unspecified   . Stiffness of joint, not elsewhere classified, unspecified site   . Abdominal pain, unspecified site   . Vascular dementia   . Shoulder dislocation   . Psoriasis   . Constipation, chronic   . Senile osteoporosis   . Osteoarthritis of both knees   . Hearing loss sensory, bilateral   . Tobacco abuse   . Chronic pain of both shoulders    Past Surgical History  Procedure Laterality Date  . Bladder surgery    . Spine surgery    . Orif patella Left 04/08/2013    Procedure: OPEN REDUCTION INTERNAL (ORIF) FIXATION PATELLA;  Surgeon: Eldred Manges, MD;  Location: [facility] OR;  Service: Orthopedics;  Laterality: Left;  Open Reduction Internal Fixation Left Patella Fracture   Social History:   reports that she has been smoking Cigarettes.  She has a 40 pack-year smoking history. She quit smokeless tobacco use about 5 weeks ago. She reports that she does not drink alcohol or use illicit drugs.  No family history on  file.  Medications: Patient's Medications  New Prescriptions   DULOXETINE (CYMBALTA) 30 MG CAPSULE    Take 1 capsule (30 mg total) by mouth daily.   EASY TOUCH PEN NEEDLES 31G X 8 MM MISC    USE AS DIRECTED ONCE DAILY   GLUCOSE BLOOD TEST STRIP    Use as instructed to check blood sugar. 250.02  Previous Medications   ALBUTEROL (PROVENTIL HFA;VENTOLIN HFA) 108 (90 BASE) MCG/ACT INHALER    Inhale 2 puffs into the lungs every 6 (six) hours as needed for wheezing.   ATENOLOL (TENORMIN) 25 MG TABLET    Take 25 mg by mouth daily.   ATORVASTATIN (LIPITOR) 10 MG TABLET    Take 10 mg by mouth daily.   CALCIPOTRIENE 0.005 % SOLUTION    Apply  1 application topically daily as needed (dry scalp).   CHOLESTYRAMINE (QUESTRAN) 4 G PACKET    Take 1 packet by mouth daily.    DILTIAZEM (DILT-XR) 180 MG 24 HR CAPSULE    Take 180 mg by mouth daily.    ESOMEPRAZOLE (NEXIUM) 40 MG CAPSULE    Take 40 mg by mouth daily.   FUROSEMIDE (LASIX) 20 MG TABLET    Take 20 mg by mouth daily.   GABAPENTIN (NEURONTIN) 300 MG CAPSULE    Take 300 mg by mouth daily. For nerve pain   GLYCOPYRROLATE (ROBINUL) 1 MG TABLET    Take one tablet once daily as needed for stomach   HYDROCHLOROTHIAZIDE (HYDRODIURIL) 25 MG TABLET    Take 25 mg by mouth daily.   HYDROCORTISONE 2.5 % LOTION    Apply 1 application topically 2 (two) times daily as needed (itchy rash).   IPRATROPIUM (ATROVENT HFA) 17 MCG/ACT INHALER    Inhale 2 puffs into the lungs 4 (four) times daily as needed for wheezing (cough).   MEMANTINE HCL ER (NAMENDA XR) 28 MG CP24    Take 28 mg by mouth daily.   NICOTINE (NICODERM CQ - DOSED IN MG/24 HR) 7 MG/24HR PATCH    Place 1 patch onto the skin daily.   PAROXETINE (PAXIL) 40 MG TABLET    Take 40 mg by mouth daily.   POTASSIUM CHLORIDE SA (K-DUR,KLOR-CON) 20 MEQ TABLET    Take 20 mEq by mouth 2 (two) times daily.   RAMIPRIL (ALTACE) 10 MG TABLET    Take 10 mg by mouth daily.    SUCRALFATE (CARAFATE) 1 G TABLET    Take 1 g by  mouth 4 (four) times daily. 1 tablet before meals and at bedtime to protect stomach   TRAZODONE (DESYREL) 150 MG TABLET    Take 150 mg by mouth at bedtime.  Modified Medications   Modified Medication Previous Medication   ACCU-CHEK FASTCLIX LANCETS MISC ACCU-CHEK FASTCLIX LANCETS MISC      1 Container by Does not apply route once. Use once daily to check blood sugars  Dx 250.02    1 cartridge by Does not apply route once. Use once daily to check blood sugars  Dx 250.02   DONEPEZIL (ARICEPT) 10 MG TABLET donepezil (ARICEPT) 10 MG tablet      Take 5 mg by mouth daily.    Take 1 tablet (10 mg total) by mouth at bedtime.   METFORMIN (GLUCOPHAGE) 500 MG TABLET metFORMIN (GLUCOPHAGE) 500 MG tablet      Take 500 mg by mouth 2 (two) times daily with a meal. Take 1 tablet once daily for diabetes    Take 1 tablet (500 mg total) by mouth 2 (two) times daily with a meal. Take 1 tablet twice a day for diabetes.  Discontinued Medications   ALBUTEROL-IPRATROPIUM (COMBIVENT) 18-103 MCG/ACT INHALER    Inhale 2 puffs into the lungs every 6 (six) hours as needed. Use 2 puffs four times a day.   ATENOLOL (TENORMIN) 25 MG TABLET    Take 25 mg by mouth daily. Take 1 tablet once a day to control blood pressure.   ATORVASTATIN (LIPITOR) 10 MG TABLET    Take 10 mg by mouth daily. Take 1 tablet once a day to lower cholesterol.   ATROVENT HFA 17 MCG/ACT INHALER       CALCIPOTRIENE 0.005 % SOLUTION       CALCIPOTRIENE 0.005 % SOLUTION    Use daily for dry scalp.  DIAZEPAM (VALIUM) 5 MG TABLET    Take 5 mg by mouth 3 (three) times daily as needed for anxiety or sleep. Take 1 tablet 3 times a day as needed for anxiety and rest.   DOCUSATE SODIUM (COLACE) 100 MG CAPSULE    Take 100 mg by mouth daily. Take 1 capsule once a day for constipation.   DONEPEZIL HCL PO    Take 10 mg by mouth daily. Take 1/2 (half) a tablet once a day to help preserve memory.   FLUOCINOLONE (SYNALAR) 0.025 % OINTMENT       FUROSEMIDE (LASIX) 20 MG  TABLET    Take 20 mg by mouth daily. Take 1 tablet once a day.   GABAPENTIN (NEURONTIN) 300 MG CAPSULE    Take 300 mg by mouth daily. Take 1 capsule once a day for nerve pain.   GLUCOSE BLOOD (ACCU-CHEK AVIVA) TEST STRIP    1 each by Other route as needed. Use as directed to monitor blood sugar.   HYDROCHLOROTHIAZIDE (HYDRODIURIL) 25 MG TABLET    Take 25 mg by mouth daily. Take 1 tablet once a day.   HYDROCORTISONE 2.5 % LOTION       LANTUS SOLOSTAR 100 UNIT/ML INJECTION       MEMANTINE (NAMENDA) 10 MG TABLET    Take 10 mg by mouth 2 (two) times daily. Take 1 tablet twice a day to help preserve memory.   METFORMIN (GLUCOPHAGE) 500 MG TABLET    Take 500 mg by mouth 2 (two) times daily with a meal. Take 1 tablet twice a day for diabetes.   NEXIUM 40 MG CAPSULE    TAKE 1 CAPSULE BY MOUTH DAILY FOR STOMACH   PAROXETINE HCL PO    Take 40 mg by mouth daily. Take 1 tablet once a day for depression.   POTASSIUM CHLORIDE (KLOR-CON) 20 MEQ PACKET    Take 20 mEq by mouth daily. Take 1 tablet once a day.   POTASSIUM CHLORIDE SA (K-DUR,KLOR-CON) 20 MEQ TABLET       PROAIR HFA 108 (90 BASE) MCG/ACT INHALER       SACCHAROMYCES BOULARDII (FLORASTOR) 250 MG CAPSULE    Take 250 mg by mouth 2 (two) times daily. Take 1 capsule twice a day for GI health.   SUCRALFATE (CARAFATE) 1 G TABLET    TAKE 1 TABLET BY MOUTH BEFORE MEALS AND AT BEDTIME TO PROTECT STOMACH   TRAMADOL HCL PO    Take 50 mg by mouth every 6 (six) hours as needed. Take 1-2 tablets every 6 hours as needed to control pain.   TRAZODONE HCL PO    Take 150 mg by mouth at bedtime. Take 1 tablet at bedtime to help with nerves and rest.     Physical Exam:  Filed Vitals:   02/28/13 0852  BP: 130/82  Pulse: 80  Temp: 98.3 F (36.8 C)  TempSrc: Oral  Resp: 20  Height: 5\' 2"  (1.575 m)  Weight: 191 lb (86.637 kg)  SpO2: 96%  Physical Exam  Constitutional:  Black female with abdominal obesity  HENT:  Head: Normocephalic and atraumatic.   Cardiovascular: Normal rate, regular rhythm, normal heart sounds and intact distal pulses.   Pulmonary/Chest: Effort normal. No respiratory distress.  Rhonchi present anteriorly and posteriorly  Abdominal: Soft. Bowel sounds are normal. She exhibits no distension. There is no tenderness.  Musculoskeletal: She exhibits tenderness. She exhibits no edema.  Tender over shoulders, knees;  4-/5 strength of lower extremities, 4+/5 upper extremities  Neurological: She is alert. She displays normal reflexes. No cranial nerve deficit. She exhibits normal muscle tone.  Oriented to person and place, not exact time, paranoid, tangential, walks unsteadily with walker  Skin: Skin is warm and dry.  Psoriatic plaques present posterior neck and ears       Labs reviewed: 05/22/2011 CMP: glucose 101, BUN 12, Creatinine 0.56 HgbA1c 6.5 Urine microalbumin 7.1  01/13/2012 CBC; WBC 11.3, RBC 4.57, HGB 13.6 CMP; Glucose 151, BUN 25, Creatinine 0.79 A1c 6.7, B12 616, Folate 5.7 05/24/2012 CBC Wbc 11.0 Rbc 4.79 Hemoglobin 14.0 CMP Glucose 101 Bin 21 Creatinine 0.77  06/22/12 Hospital labs CBC; WBC 9.9, RBC 4.18, HGB 12.3 CMP; Glucose 123, Creatinine 1.08, BUN 22 07/21/2012  CBC: wbc 10.9, rbc 4.39, Hemoglobin 13.2 CMP: glucose 113, BUN 21, Creatinine 0.68, Alkaline Phos 110 TSH: 0.806 Basic Metabolic Panel:  Recent Labs  14/78/29 0515  04/10/13 0430 05/19/13 1130 06/06/13 1824  NA 140  --  134* 142 140  K 3.9  --  3.7 3.4* 3.1*  CL 101  --  97 100 104  CO2 29  --  28 28 26   GLUCOSE 126*  --  103* 84 92  BUN 14  --  11 22 11   CREATININE 0.77  < > 0.66 0.87 0.59  CALCIUM 9.7  --  9.7 10.3* 10.2  TSH 0.759  --   --   --   --   < > = values in this interval not displayed. Liver Function Tests:  Recent Labs  06/22/12 1520 02/28/13 1021  AST 20 14  ALT 29 11  ALKPHOS 114 89  BILITOT 0.2* 0.1  PROT 7.2 7.1  ALBUMIN 3.3*  --    CBC:  Recent Labs  04/07/13 1431  04/08/13 2320 04/10/13 0430  05/19/13 1130 06/06/13 1824  WBC 14.9*  < > 10.2 12.1* 9.9 10.0  NEUTROABS 4.8  --   --   --  3.0 3.8  HGB 11.2*  < > 11.9* 12.2 11.6 12.2  HCT 33.1*  < > 35.9* 36.5 36.4 35.6*  MCV 88.0  < > 88.2 86.9 91 87.0  PLT 221  < > 250 271  --  214  < > = values in this interval not displayed.  Past Procedures: 11/22/2007 Colonoscopy.  Normal /diverticula Dr Evette Cristal          07/08/2010 Venous Doppler no evidence of deep vein or superficial thrombosis or Baker's cyst. 08/22/2010 Abdomen X-Ray Unremarkable exam  08/22/2010 CT of Abdomen and Pelvis Postoperative changes in the lumbar spine New T12 and L2 compression fractures Severe left foraminal narrowing at L3-4,L4-5 and L5-S1 Prominent stool Stable intra-abdominal findings 08/22/2010 CT of the Head Generalized volume loss and mild to moderate nonspecific white matter changes No acute intracranial abnormality  08/30/2010 X-Ray of the Abdomen Severe constipation No overt small bowel obstruction  08/30/2010 Complete Abdominal Ultrasound Possible intrahepatic gallbladder containing a large gallstone. No evidence of acute cholecystitis. Mild intrahepatic and extrahepatic biliary duct dilatation appears similar to 08/22/2010 Fatty Liver. 07/25/2010 Bone Density 11/22/2007 Colonoscopy.  Normal /diverticula Dr Evette Cristal          07/08/2010 Venous Doppler no evidence of deep vein or superficial thrombosis or Baker's cyst. 08/22/2010 Abdomen X-Ray Unremarkable exam  08/22/2010 CT of Abdomen and Pelvis Postoperative changes in the lumbar spine New T12 and L2 compression fractures Severe left foraminal narrowing at L3-4,L4-5 and L5-S1 Prominent stool Stable intra-abdominal findings 08/22/2010 CT of the Head Generalized volume loss  and mild to moderate nonspecific white matter changes No acute intracranial abnormality  08/30/2010 X-Ray of the Abdomen Severe constipation No overt small bowel obstruction  08/30/2010 Complete Abdominal Ultrasound Possible intrahepatic  gallbladder containing a   large gallstone. No evidence of acute cholecystitis. Mild intrahepatic and extrahepatic biliary duct dilatation appears similar to 08/22/2010 Fatty Liver. 07/25/2010 Bone Density 10/06/2011 Mammogram - No specific mammographic evidence of malignancy. Next screening mammogram is recommended in one year. 06/22/12 CT Head w/o contrast - Stable atrophy and small vessel disease. 06/22/12 DG Chest 2 view - Bibasilar atelectasis.  Assessment/Plan 1. Unspecified essential hypertension --bp goal is <140/90 due to her age and comorbid illnesses  2. Stiffness of joint, not elsewhere classified, unspecified site Especially shoulders--has some component of frozen shoulder  3. Pain in joint, site unspecified --has generalized osteoarthritis with pain not relieved easily with meds and many cause side effects  4. Chronic pain of both shoulders, unspecified laterality -limits functionality with walker--power chair would help  5. Tobacco abuse --ongoing, not ready to quit  6. Osteoarthritis of both knees --limits ability to ambulate with walker--would benefit from a power wheelchair to get around her home and outside  7. Shoulder dislocation, unspecified laterality, sequela --shoulder dislocation and chronic pain limits ability to put pressure on her shoulders while walkng with a walker, but she would be able to operate the controls of a power wheelchair  8. Vascular dementia -moderate--able to operate a power/motorized wheelchair if she had one - Lipid panel  9. Type II or unspecified type diabetes mellitus with neurological manifestations, not stated as uncontrolled(250.60) -need f/u labs, cont current meds - CBC with Differential - Comprehensive metabolic panel - Hemoglobin A1c  Labs/tests ordered:  [employer] paperwork done;  Lipids, hba1c, cmp, cbc

## 2013-03-01 LAB — CBC WITH DIFFERENTIAL/PLATELET
Basophils Absolute: 0 10*3/uL (ref 0.0–0.2)
Basos: 0 % (ref 0–3)
Eos: 4 % (ref 0–5)
Eosinophils Absolute: 0.4 10*3/uL (ref 0.0–0.4)
HCT: 38.6 % (ref 34.0–46.6)
Hemoglobin: 12.5 g/dL (ref 11.1–15.9)
Immature Grans (Abs): 0 10*3/uL (ref 0.0–0.1)
Immature Granulocytes: 0 % (ref 0–2)
Lymphocytes Absolute: 5.3 10*3/uL — ABNORMAL HIGH (ref 0.7–3.1)
Lymphs: 54 % — ABNORMAL HIGH (ref 14–46)
MCH: 29.3 pg (ref 26.6–33.0)
MCHC: 32.4 g/dL (ref 31.5–35.7)
MCV: 90 fL (ref 79–97)
Monocytes Absolute: 0.5 10*3/uL (ref 0.1–0.9)
Monocytes: 5 % (ref 4–12)
Neutrophils Absolute: 3.5 10*3/uL (ref 1.4–7.0)
Neutrophils Relative %: 37 % — ABNORMAL LOW (ref 40–74)
RBC: 4.27 x10E6/uL (ref 3.77–5.28)
RDW: 15.7 % — ABNORMAL HIGH (ref 12.3–15.4)
WBC: 9.6 10*3/uL (ref 3.4–10.8)

## 2013-03-01 LAB — COMPREHENSIVE METABOLIC PANEL
ALT: 11 IU/L (ref 0–32)
AST: 14 IU/L (ref 0–40)
Albumin/Globulin Ratio: 1.4 (ref 1.1–2.5)
Albumin: 4.1 g/dL (ref 3.5–4.8)
Alkaline Phosphatase: 89 IU/L (ref 39–117)
BUN/Creatinine Ratio: 22 (ref 11–26)
BUN: 16 mg/dL (ref 8–27)
CO2: 26 mmol/L (ref 19–28)
Calcium: 9.8 mg/dL (ref 8.6–10.2)
Chloride: 100 mmol/L (ref 97–108)
Creatinine, Ser: 0.72 mg/dL (ref 0.57–1.00)
GFR calc Af Amer: 97 mL/min/{1.73_m2} (ref >59)
GFR calc non Af Amer: 84 mL/min/{1.73_m2} (ref >59)
Globulin, Total: 3 g/dL (ref 1.5–4.5)
Glucose: 70 mg/dL (ref 65–99)
Potassium: 3.9 mmol/L (ref 3.5–5.2)
Sodium: 140 mmol/L (ref 134–144)
Total Bilirubin: 0.1 mg/dL (ref 0.0–1.2)
Total Protein: 7.1 g/dL (ref 6.0–8.5)

## 2013-03-01 LAB — LIPID PANEL
Chol/HDL Ratio: 3.1 ratio units (ref 0.0–4.4)
Cholesterol, Total: 138 mg/dL (ref 100–199)
HDL: 45 mg/dL (ref >39)
LDL Calculated: 60 mg/dL (ref 0–99)
Triglycerides: 164 mg/dL — ABNORMAL HIGH (ref 0–149)
VLDL Cholesterol Cal: 33 mg/dL (ref 5–40)

## 2013-03-01 LAB — HEMOGLOBIN A1C
Est. average glucose Bld gHb Est-mCnc: 137 mg/dL
Hgb A1c MFr Bld: 6.4 % — ABNORMAL HIGH (ref 4.8–5.6)

## 2013-03-15 ENCOUNTER — Other Ambulatory Visit: Payer: Self-pay | Admitting: Geriatric Medicine

## 2013-03-15 MED ORDER — METFORMIN HCL 500 MG PO TABS: 500.0000 mg | ORAL_TABLET | Freq: Two times a day (BID) | ORAL | Status: DC

## 2013-04-07 ENCOUNTER — Inpatient Hospital Stay (HOSPITAL_COMMUNITY)
Admission: EM | Admit: 2013-04-07 | Discharge: 2013-04-12 | DRG: 493 | Disposition: A | Discharge status: Skilled Nursing Facility | Payer: Medicare Other | Attending: Internal Medicine | Admitting: Internal Medicine

## 2013-04-07 ENCOUNTER — Encounter (HOSPITAL_COMMUNITY): Payer: Self-pay | Admitting: Nurse Practitioner

## 2013-04-07 ENCOUNTER — Emergency Department (HOSPITAL_COMMUNITY): Payer: Medicare Other

## 2013-04-07 DIAGNOSIS — E119 Type 2 diabetes mellitus without complications: Secondary | ICD-10-CM | POA: Diagnosis present

## 2013-04-07 DIAGNOSIS — L408 Other psoriasis: Secondary | ICD-10-CM | POA: Diagnosis present

## 2013-04-07 DIAGNOSIS — S82009A Unspecified fracture of unspecified patella, initial encounter for closed fracture: Principal | ICD-10-CM | POA: Diagnosis present

## 2013-04-07 DIAGNOSIS — H903 Sensorineural hearing loss, bilateral: Secondary | ICD-10-CM | POA: Diagnosis present

## 2013-04-07 DIAGNOSIS — Z9181 History of falling: Secondary | ICD-10-CM

## 2013-04-07 DIAGNOSIS — J301 Allergic rhinitis due to pollen: Secondary | ICD-10-CM | POA: Diagnosis present

## 2013-04-07 DIAGNOSIS — E785 Hyperlipidemia, unspecified: Secondary | ICD-10-CM | POA: Diagnosis present

## 2013-04-07 DIAGNOSIS — R269 Unspecified abnormalities of gait and mobility: Secondary | ICD-10-CM | POA: Diagnosis present

## 2013-04-07 DIAGNOSIS — F0153 Vascular dementia, unspecified severity, with mood disturbance: Secondary | ICD-10-CM | POA: Diagnosis present

## 2013-04-07 DIAGNOSIS — Y92009 Unspecified place in unspecified non-institutional (private) residence as the place of occurrence of the external cause: Secondary | ICD-10-CM

## 2013-04-07 DIAGNOSIS — F4310 Post-traumatic stress disorder, unspecified: Secondary | ICD-10-CM | POA: Diagnosis present

## 2013-04-07 DIAGNOSIS — Z79899 Other long term (current) drug therapy: Secondary | ICD-10-CM

## 2013-04-07 DIAGNOSIS — I672 Cerebral atherosclerosis: Secondary | ICD-10-CM | POA: Diagnosis present

## 2013-04-07 DIAGNOSIS — K219 Gastro-esophageal reflux disease without esophagitis: Secondary | ICD-10-CM | POA: Diagnosis present

## 2013-04-07 DIAGNOSIS — M170 Bilateral primary osteoarthritis of knee: Secondary | ICD-10-CM | POA: Diagnosis present

## 2013-04-07 DIAGNOSIS — I10 Essential (primary) hypertension: Secondary | ICD-10-CM | POA: Diagnosis present

## 2013-04-07 DIAGNOSIS — M810 Age-related osteoporosis without current pathological fracture: Secondary | ICD-10-CM | POA: Diagnosis present

## 2013-04-07 DIAGNOSIS — M4000 Postural kyphosis, site unspecified: Secondary | ICD-10-CM | POA: Diagnosis present

## 2013-04-07 DIAGNOSIS — F17200 Nicotine dependence, unspecified, uncomplicated: Secondary | ICD-10-CM | POA: Diagnosis present

## 2013-04-07 DIAGNOSIS — Z720 Tobacco use: Secondary | ICD-10-CM

## 2013-04-07 DIAGNOSIS — K5730 Diverticulosis of large intestine without perforation or abscess without bleeding: Secondary | ICD-10-CM | POA: Diagnosis present

## 2013-04-07 DIAGNOSIS — W010XXA Fall on same level from slipping, tripping and stumbling without subsequent striking against object, initial encounter: Secondary | ICD-10-CM | POA: Diagnosis present

## 2013-04-07 DIAGNOSIS — F0151 Vascular dementia with behavioral disturbance: Secondary | ICD-10-CM | POA: Diagnosis present

## 2013-04-07 DIAGNOSIS — M2550 Pain in unspecified joint: Secondary | ICD-10-CM

## 2013-04-07 DIAGNOSIS — F411 Generalized anxiety disorder: Secondary | ICD-10-CM | POA: Diagnosis present

## 2013-04-07 DIAGNOSIS — Z888 Allergy status to other drugs, medicaments and biological substances status: Secondary | ICD-10-CM

## 2013-04-07 DIAGNOSIS — G8929 Other chronic pain: Secondary | ICD-10-CM | POA: Diagnosis present

## 2013-04-07 DIAGNOSIS — E538 Deficiency of other specified B group vitamins: Secondary | ICD-10-CM | POA: Diagnosis present

## 2013-04-07 DIAGNOSIS — E876 Hypokalemia: Secondary | ICD-10-CM | POA: Diagnosis present

## 2013-04-07 DIAGNOSIS — S82002A Unspecified fracture of left patella, initial encounter for closed fracture: Secondary | ICD-10-CM

## 2013-04-07 DIAGNOSIS — M1710 Unilateral primary osteoarthritis, unspecified knee: Secondary | ICD-10-CM

## 2013-04-07 LAB — GLUCOSE, CAPILLARY
Glucose-Capillary: 70 mg/dL (ref 70–99)
Glucose-Capillary: 119 mg/dL — ABNORMAL HIGH (ref 70–99)

## 2013-04-07 LAB — CBC WITH DIFFERENTIAL/PLATELET
Basophils Absolute: 0 10*3/uL (ref 0.0–0.1)
Eosinophils Absolute: 0.3 10*3/uL (ref 0.0–0.7)
HCT: 33.1 % — ABNORMAL LOW (ref 36.0–46.0)
Lymphocytes Relative: 61 % — ABNORMAL HIGH (ref 12–46)
MCHC: 33.8 g/dL (ref 30.0–36.0)
Neutro Abs: 4.8 10*3/uL (ref 1.7–7.7)
RDW: 14.9 % (ref 11.5–15.5)

## 2013-04-07 LAB — URINALYSIS, ROUTINE W REFLEX MICROSCOPIC
Bilirubin Urine: NEGATIVE
Ketones, ur: NEGATIVE mg/dL
Nitrite: NEGATIVE
Protein, ur: NEGATIVE mg/dL
Specific Gravity, Urine: 1.008 (ref 1.005–1.030)
Urobilinogen, UA: 0.2 mg/dL (ref 0.0–1.0)

## 2013-04-07 LAB — BASIC METABOLIC PANEL
BUN: 16 mg/dL (ref 6–23)
Creatinine, Ser: 0.74 mg/dL (ref 0.50–1.10)
GFR calc Af Amer: >90 mL/min (ref >90)
GFR calc non Af Amer: 83 mL/min — ABNORMAL LOW (ref >90)
Potassium: 3.2 mEq/L — ABNORMAL LOW (ref 3.5–5.1)

## 2013-04-07 MED ORDER — TRAMADOL HCL 50 MG PO TABS
100.0000 mg | ORAL_TABLET | Freq: Once | ORAL | Status: AC
  Administered 2013-04-07: 100 mg via ORAL
  Filled 2013-04-07: qty 2

## 2013-04-07 MED ORDER — INSULIN ASPART 100 UNIT/ML ~~LOC~~ SOLN
0.0000 [IU] | Freq: Three times a day (TID) | SUBCUTANEOUS | Status: DC
  Administered 2013-04-08 – 2013-04-12 (×4): 2 [IU] via SUBCUTANEOUS

## 2013-04-07 MED ORDER — DONEPEZIL HCL 5 MG PO TABS
5.0000 mg | ORAL_TABLET | Freq: Every day | ORAL | Status: DC
  Administered 2013-04-08 – 2013-04-12 (×4): 5 mg via ORAL
  Filled 2013-04-07 (×6): qty 1

## 2013-04-07 MED ORDER — HEPARIN SODIUM (PORCINE) 5000 UNIT/ML IJ SOLN
5000.0000 [IU] | Freq: Three times a day (TID) | INTRAMUSCULAR | Status: DC
  Administered 2013-04-08: 5000 [IU] via SUBCUTANEOUS
  Filled 2013-04-07 (×5): qty 1

## 2013-04-07 MED ORDER — ACETAMINOPHEN 500 MG PO TABS
1000.0000 mg | ORAL_TABLET | Freq: Once | ORAL | Status: AC
  Administered 2013-04-07: 1000 mg via ORAL
  Filled 2013-04-07: qty 2

## 2013-04-07 MED ORDER — ACETAMINOPHEN 650 MG RE SUPP: 650.0000 mg | Freq: Four times a day (QID) | RECTAL | Status: DC | PRN

## 2013-04-07 MED ORDER — DOCUSATE SODIUM 100 MG PO CAPS
100.0000 mg | ORAL_CAPSULE | Freq: Every day | ORAL | Status: DC
  Administered 2013-04-07 – 2013-04-12 (×6): 100 mg via ORAL
  Filled 2013-04-07 (×5): qty 1

## 2013-04-07 MED ORDER — ACETAMINOPHEN 325 MG PO TABS
650.0000 mg | ORAL_TABLET | Freq: Four times a day (QID) | ORAL | Status: DC | PRN
  Administered 2013-04-09 – 2013-04-10 (×4): 650 mg via ORAL
  Filled 2013-04-07 (×4): qty 2

## 2013-04-07 MED ORDER — ONDANSETRON HCL 4 MG PO TABS: 4.0000 mg | ORAL_TABLET | Freq: Four times a day (QID) | ORAL | Status: DC | PRN

## 2013-04-07 MED ORDER — HYDROMORPHONE HCL PF 1 MG/ML IJ SOLN
INTRAMUSCULAR | Status: AC
  Filled 2013-04-07: qty 1

## 2013-04-07 MED ORDER — ONDANSETRON HCL 4 MG/2ML IJ SOLN: 4.0000 mg | Freq: Four times a day (QID) | INTRAMUSCULAR | Status: DC | PRN

## 2013-04-07 MED ORDER — DILTIAZEM HCL ER 180 MG PO CP24
180.0000 mg | ORAL_CAPSULE | Freq: Every day | ORAL | Status: DC
  Administered 2013-04-08 – 2013-04-12 (×5): 180 mg via ORAL
  Filled 2013-04-07 (×6): qty 1

## 2013-04-07 MED ORDER — HYDROMORPHONE HCL PF 1 MG/ML IJ SOLN
0.5000 mg | INTRAMUSCULAR | Status: DC | PRN
  Administered 2013-04-07 – 2013-04-08 (×2): 0.5 mg via INTRAVENOUS
  Filled 2013-04-07: qty 1

## 2013-04-07 MED ORDER — PAROXETINE HCL 20 MG PO TABS
20.0000 mg | ORAL_TABLET | Freq: Every day | ORAL | Status: DC
  Administered 2013-04-08 – 2013-04-12 (×5): 20 mg via ORAL
  Filled 2013-04-07 (×6): qty 1

## 2013-04-07 MED ORDER — POTASSIUM CHLORIDE CRYS ER 20 MEQ PO TBCR
40.0000 meq | EXTENDED_RELEASE_TABLET | Freq: Four times a day (QID) | ORAL | Status: AC
  Administered 2013-04-07 – 2013-04-08 (×2): 40 meq via ORAL
  Filled 2013-04-07 (×2): qty 2

## 2013-04-07 MED ORDER — ATORVASTATIN CALCIUM 10 MG PO TABS
10.0000 mg | ORAL_TABLET | Freq: Every day | ORAL | Status: DC
  Administered 2013-04-08 – 2013-04-12 (×5): 10 mg via ORAL
  Filled 2013-04-07 (×6): qty 1

## 2013-04-07 MED ORDER — IPRATROPIUM-ALBUTEROL 18-103 MCG/ACT IN AERO
2.0000 | INHALATION_SPRAY | Freq: Four times a day (QID) | RESPIRATORY_TRACT | Status: DC | PRN
  Filled 2013-04-07: qty 14.7

## 2013-04-07 MED ORDER — TRAMADOL HCL 50 MG PO TABS
100.0000 mg | ORAL_TABLET | Freq: Four times a day (QID) | ORAL | Status: DC
  Administered 2013-04-07 – 2013-04-12 (×16): 100 mg via ORAL
  Filled 2013-04-07 (×17): qty 2

## 2013-04-07 MED ORDER — PANTOPRAZOLE SODIUM 40 MG PO TBEC
40.0000 mg | DELAYED_RELEASE_TABLET | Freq: Every day | ORAL | Status: DC
  Administered 2013-04-08 – 2013-04-12 (×6): 40 mg via ORAL
  Filled 2013-04-07 (×6): qty 1

## 2013-04-07 MED ORDER — SODIUM CHLORIDE 0.9 % IV SOLN: INTRAVENOUS | Status: DC

## 2013-04-07 MED ORDER — MEMANTINE HCL 10 MG PO TABS
10.0000 mg | ORAL_TABLET | Freq: Two times a day (BID) | ORAL | Status: DC
  Administered 2013-04-07 – 2013-04-12 (×10): 10 mg via ORAL
  Filled 2013-04-07 (×11): qty 1

## 2013-04-07 MED ORDER — ATENOLOL 25 MG PO TABS
25.0000 mg | ORAL_TABLET | Freq: Every day | ORAL | Status: DC
  Administered 2013-04-08 – 2013-04-12 (×5): 25 mg via ORAL
  Filled 2013-04-07 (×6): qty 1

## 2013-04-07 MED ORDER — POTASSIUM CHLORIDE CRYS ER 20 MEQ PO TBCR
40.0000 meq | EXTENDED_RELEASE_TABLET | Freq: Once | ORAL | Status: AC
  Administered 2013-04-07: 40 meq via ORAL
  Filled 2013-04-07: qty 2

## 2013-04-07 NOTE — ED Notes (Signed)
Pt given orange juice, sandwich and crackers.

## 2013-04-07 NOTE — ED Provider Notes (Signed)
ECG shows normal sinus rhythm with a rate of 70, no ectopy. Normal axis. Normal P wave. Normal QRS. Normal intervals. Normal ST and T waves. Impression: normal ECG. When compared with ECG of 06/22/2012, no significant changes are seen.  Dione Booze, MD 04/07/13 1544

## 2013-04-07 NOTE — ED Notes (Signed)
Called flow manager to get bed requested.
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
## 2013-04-07 NOTE — ED Notes (Addendum)
States she fell onto L knee Saturday and increasing pain since. Applied ice and took pain pills her doctor gave her with no relief. Cms intact
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
## 2013-04-07 NOTE — ED Notes (Signed)
On return from xray, pt had been incontinent of urine. Had soaked her diaper and the sheets were wet.

## 2013-04-07 NOTE — ED Notes (Signed)
Attempted report. Put on hold for 8 mins, no answer.
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
## 2013-04-07 NOTE — ED Notes (Signed)
Ortho at bedside.

## 2013-04-07 NOTE — Progress Notes (Signed)
Orthopedic Tech Progress Note Patient Details:  Amanda Adkins Dec 28, 1940 [id number]  Ortho Devices Type of Ortho Device: Knee Immobilizer Ortho Device/Splint Location: LLE Ortho Device/Splint Interventions: Ordered;Application   Jennye Moccasin 04/07/2013, 4:22 PM

## 2013-04-08 ENCOUNTER — Encounter (HOSPITAL_COMMUNITY)
Admission: EM | Disposition: A | Discharge status: Skilled Nursing Facility | Payer: Self-pay | Source: Home / Self Care | Attending: Internal Medicine

## 2013-04-08 ENCOUNTER — Encounter (HOSPITAL_COMMUNITY): Payer: Self-pay | Admitting: Anesthesiology

## 2013-04-08 ENCOUNTER — Inpatient Hospital Stay (HOSPITAL_COMMUNITY): Payer: Medicare Other

## 2013-04-08 ENCOUNTER — Inpatient Hospital Stay (HOSPITAL_COMMUNITY): Payer: Medicare Other | Admitting: Anesthesiology

## 2013-04-08 DIAGNOSIS — M2550 Pain in unspecified joint: Secondary | ICD-10-CM

## 2013-04-08 HISTORY — PX: ORIF PATELLA: SHX5033

## 2013-04-08 LAB — CBC
HCT: 33.7 % — ABNORMAL LOW (ref 36.0–46.0)
Hemoglobin: 11.9 g/dL — ABNORMAL LOW (ref 12.0–15.0)
MCH: 29.2 pg (ref 26.0–34.0)
MCHC: 33.1 g/dL (ref 30.0–36.0)
MCV: 87.8 fL (ref 78.0–100.0)
Platelets: 230 10*3/uL (ref 150–400)
RBC: 3.84 MIL/uL — ABNORMAL LOW (ref 3.87–5.11)
RDW: 14.8 % (ref 11.5–15.5)
WBC: 7.9 10*3/uL (ref 4.0–10.5)

## 2013-04-08 LAB — BASIC METABOLIC PANEL
CO2: 29 mEq/L (ref 19–32)
Calcium: 9.7 mg/dL (ref 8.4–10.5)
Chloride: 101 mEq/L (ref 96–112)
Creatinine, Ser: 0.77 mg/dL (ref 0.50–1.10)
Glucose, Bld: 126 mg/dL — ABNORMAL HIGH (ref 70–99)
Sodium: 140 mEq/L (ref 135–145)

## 2013-04-08 LAB — PATHOLOGIST SMEAR REVIEW

## 2013-04-08 LAB — GLUCOSE, CAPILLARY
Glucose-Capillary: 139 mg/dL — ABNORMAL HIGH (ref 70–99)
Glucose-Capillary: 112 mg/dL — ABNORMAL HIGH (ref 70–99)
Glucose-Capillary: 102 mg/dL — ABNORMAL HIGH (ref 70–99)

## 2013-04-08 LAB — TSH: TSH: 0.759 u[IU]/mL (ref 0.350–4.500)

## 2013-04-08 LAB — SURGICAL PCR SCREEN: Staphylococcus aureus: POSITIVE — AB

## 2013-04-08 SURGERY — OPEN REDUCTION INTERNAL FIXATION (ORIF) PATELLA
Anesthesia: General | Site: Knee | Laterality: Left | Wound class: Clean

## 2013-04-08 MED ORDER — CEFAZOLIN SODIUM-DEXTROSE 2-3 GM-% IV SOLR
2.0000 g | Freq: Once | INTRAVENOUS | Status: AC
  Administered 2013-04-08: 2 g via INTRAVENOUS
  Filled 2013-04-08: qty 50

## 2013-04-08 MED ORDER — BUPIVACAINE HCL (PF) 0.25 % IJ SOLN
INTRAMUSCULAR | Status: DC | PRN
  Administered 2013-04-08: 20 mL

## 2013-04-08 MED ORDER — SUCCINYLCHOLINE CHLORIDE 20 MG/ML IJ SOLN
INTRAMUSCULAR | Status: DC | PRN
  Administered 2013-04-08: 100 mg via INTRAVENOUS

## 2013-04-08 MED ORDER — LIDOCAINE HCL (CARDIAC) 20 MG/ML IV SOLN
INTRAVENOUS | Status: DC | PRN
  Administered 2013-04-08: 70 mg via INTRAVENOUS

## 2013-04-08 MED ORDER — 0.9 % SODIUM CHLORIDE (POUR BTL) OPTIME
TOPICAL | Status: DC | PRN
  Administered 2013-04-08: 1000 mL

## 2013-04-08 MED ORDER — ONDANSETRON HCL 4 MG/2ML IJ SOLN: 4.0000 mg | Freq: Four times a day (QID) | INTRAMUSCULAR | Status: DC | PRN

## 2013-04-08 MED ORDER — ONDANSETRON HCL 4 MG/2ML IJ SOLN
INTRAMUSCULAR | Status: DC | PRN
  Administered 2013-04-08: 4 mg via INTRAVENOUS

## 2013-04-08 MED ORDER — METOCLOPRAMIDE HCL 10 MG PO TABS: 5.0000 mg | ORAL_TABLET | Freq: Three times a day (TID) | ORAL | Status: DC | PRN

## 2013-04-08 MED ORDER — PROPOFOL 10 MG/ML IV BOLUS
INTRAVENOUS | Status: DC | PRN
  Administered 2013-04-08: 110 mg via INTRAVENOUS

## 2013-04-08 MED ORDER — SODIUM CHLORIDE 0.9 % IV SOLN
INTRAVENOUS | Status: DC
  Administered 2013-04-09: via INTRAVENOUS

## 2013-04-08 MED ORDER — FENTANYL CITRATE 0.05 MG/ML IJ SOLN
25.0000 ug | INTRAMUSCULAR | Status: DC | PRN
  Administered 2013-04-08 (×2): 50 ug via INTRAVENOUS

## 2013-04-08 MED ORDER — LACTATED RINGERS IV SOLN
INTRAVENOUS | Status: DC | PRN
  Administered 2013-04-08: 20:00:00 via INTRAVENOUS

## 2013-04-08 MED ORDER — FENTANYL CITRATE 0.05 MG/ML IJ SOLN
INTRAMUSCULAR | Status: DC | PRN
  Administered 2013-04-08: 25 ug via INTRAVENOUS
  Administered 2013-04-08: 75 ug via INTRAVENOUS
  Administered 2013-04-08: 25 ug via INTRAVENOUS

## 2013-04-08 MED ORDER — ONDANSETRON HCL 4 MG PO TABS: 4.0000 mg | ORAL_TABLET | Freq: Four times a day (QID) | ORAL | Status: DC | PRN

## 2013-04-08 MED ORDER — METOCLOPRAMIDE HCL 5 MG/ML IJ SOLN: 5.0000 mg | Freq: Three times a day (TID) | INTRAMUSCULAR | Status: DC | PRN

## 2013-04-08 MED ORDER — ENOXAPARIN SODIUM 40 MG/0.4ML ~~LOC~~ SOLN
40.0000 mg | SUBCUTANEOUS | Status: DC
  Administered 2013-04-09: 40 mg via SUBCUTANEOUS
  Filled 2013-04-08 (×2): qty 0.4

## 2013-04-08 SURGICAL SUPPLY — 57 items
BANDAGE ELASTIC 4 VELCRO ST LF (GAUZE/BANDAGES/DRESSINGS) IMPLANT
BANDAGE ELASTIC 6 VELCRO ST LF (GAUZE/BANDAGES/DRESSINGS) IMPLANT
BANDAGE ESMARK 6X9 LF (GAUZE/BANDAGES/DRESSINGS) ×1 IMPLANT
BIT DRILL 2.9 CANN QC NONSTRL (BIT) ×2 IMPLANT
BLADE SURG ROTATE 9660 (MISCELLANEOUS) IMPLANT
BNDG COHESIVE 4X5 TAN STRL (GAUZE/BANDAGES/DRESSINGS) ×2 IMPLANT
BNDG ESMARK 6X9 LF (GAUZE/BANDAGES/DRESSINGS) ×2
CLOTH BEACON ORANGE TIMEOUT ST (SAFETY) ×2 IMPLANT
COVER SURGICAL LIGHT HANDLE (MISCELLANEOUS) ×2 IMPLANT
CUFF TOURNIQUET SINGLE 34IN LL (TOURNIQUET CUFF) ×2 IMPLANT
CUFF TOURNIQUET SINGLE 44IN (TOURNIQUET CUFF) IMPLANT
DRAPE C-ARM 42X72 X-RAY (DRAPES) ×2 IMPLANT
DRSG ADAPTIC 3X8 NADH LF (GAUZE/BANDAGES/DRESSINGS) IMPLANT
DRSG EMULSION OIL 3X3 NADH (GAUZE/BANDAGES/DRESSINGS) IMPLANT
DRSG PAD ABDOMINAL 8X10 ST (GAUZE/BANDAGES/DRESSINGS) IMPLANT
ELECT REM PT RETURN 9FT ADLT (ELECTROSURGICAL) ×2
ELECTRODE REM PT RTRN 9FT ADLT (ELECTROSURGICAL) ×1 IMPLANT
GLOVE BIOGEL PI IND STRL 7.5 (GLOVE) IMPLANT
GLOVE BIOGEL PI IND STRL 8 (GLOVE) ×1 IMPLANT
GLOVE BIOGEL PI INDICATOR 7.5 (GLOVE)
GLOVE BIOGEL PI INDICATOR 8 (GLOVE) ×1
GLOVE ECLIPSE 7.0 STRL STRAW (GLOVE) IMPLANT
GLOVE ORTHO TXT STRL SZ7.5 (GLOVE) ×2 IMPLANT
GOWN PREVENTION PLUS LG XLONG (DISPOSABLE) IMPLANT
GOWN PREVENTION PLUS XLARGE (GOWN DISPOSABLE) ×4 IMPLANT
IMMOBILIZER KNEE 22 UNIV (SOFTGOODS) ×2 IMPLANT
K-WIRE ACE 1.6X6 (WIRE) ×2
KIT BASIN OR (CUSTOM PROCEDURE TRAY) ×2 IMPLANT
KIT ROOM TURNOVER OR (KITS) ×2 IMPLANT
KWIRE ACE 1.6X6 (WIRE) ×1 IMPLANT
MANIFOLD NEPTUNE II (INSTRUMENTS) IMPLANT
NEEDLE 22X1 1/2 (OR ONLY) (NEEDLE) ×2 IMPLANT
NS IRRIG 1000ML POUR BTL (IV SOLUTION) ×2 IMPLANT
PACK ORTHO EXTREMITY (CUSTOM PROCEDURE TRAY) ×2 IMPLANT
PAD ARMBOARD 7.5X6 YLW CONV (MISCELLANEOUS) ×2 IMPLANT
PAD CAST 4YDX4 CTTN HI CHSV (CAST SUPPLIES) IMPLANT
PADDING CAST COTTON 4X4 STRL (CAST SUPPLIES)
SCREW LAG  RD HEAD 4.0 38 LTH (Screw) ×1 IMPLANT
SCREW LAG  RD HEAD 4.0 42 LTH (Screw) ×1 IMPLANT
SCREW LAG RD HEAD 4.0 38 LTH (Screw) ×1 IMPLANT
SCREW LAG RD HEAD 4.0 42 LTH (Screw) ×1 IMPLANT
SPONGE GAUZE 4X4 12PLY (GAUZE/BANDAGES/DRESSINGS) ×2 IMPLANT
SPONGE LAP 18X18 X RAY DECT (DISPOSABLE) ×2 IMPLANT
SPONGE LAP 4X18 X RAY DECT (DISPOSABLE) IMPLANT
STAPLER VISISTAT 35W (STAPLE) IMPLANT
SUCTION FRAZIER TIP 10 FR DISP (SUCTIONS) ×2 IMPLANT
SUT VIC AB 0 CT1 27 (SUTURE)
SUT VIC AB 0 CT1 27XBRD ANBCTR (SUTURE) IMPLANT
SUT VIC AB 2-0 CT1 27 (SUTURE)
SUT VIC AB 2-0 CT1 TAPERPNT 27 (SUTURE) IMPLANT
SYR CONTROL 10ML LL (SYRINGE) ×2 IMPLANT
TOWEL OR 17X24 6PK STRL BLUE (TOWEL DISPOSABLE) ×2 IMPLANT
TOWEL OR 17X26 10 PK STRL BLUE (TOWEL DISPOSABLE) ×2 IMPLANT
TUBE CONNECTING 12X1/4 (SUCTIONS) ×2 IMPLANT
UNDERPAD 30X30 INCONTINENT (UNDERPADS AND DIAPERS) ×2 IMPLANT
WATER STERILE IRR 1000ML POUR (IV SOLUTION) IMPLANT
YANKAUER SUCT BULB TIP NO VENT (SUCTIONS) ×2 IMPLANT

## 2013-04-08 NOTE — Interval H&P Note (Signed)
History and Physical Interval Note:  04/08/2013 8:30 PM  Debbe Bales  has presented today for surgery, with the diagnosis of Left Patella Fracture  The various methods of treatment have been discussed with the patient and family. After consideration of risks, benefits and other options for treatment, the patient has consented to  Procedure(s) with comments: OPEN REDUCTION INTERNAL (ORIF) FIXATION PATELLA (Left) - Open Reduction Internal Fixation Left Patella Fracture as a surgical intervention .  The patient's history has been reviewed, patient examined, no change in status, stable for surgery.  I have reviewed the patient's chart and labs.  Questions were answered to the patient's satisfaction.     YATES,MARK C
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
## 2013-04-08 NOTE — Anesthesia Procedure Notes (Signed)
Procedure Name: Intubation Date/Time: 04/08/2013 8:39 PM Performed by: Molli Hazard Pre-anesthesia Checklist: Patient identified, Emergency Drugs available, Suction available and Patient being monitored Patient Re-evaluated:Patient Re-evaluated prior to inductionOxygen Delivery Method: Circle system utilized Preoxygenation: Pre-oxygenation with 100% oxygen Intubation Type: IV induction, Rapid sequence and Cricoid Pressure applied Laryngoscope Size: Miller and 2 Grade View: Grade I Tube type: Oral Tube size: 7.5 mm Number of attempts: 1 Airway Equipment and Method: Stylet Placement Confirmation: ETT inserted through vocal cords under direct vision,  positive ETCO2 and breath sounds checked- equal and bilateral Secured at: 21 cm Tube secured with: Tape Dental Injury: Teeth and Oropharynx as per pre-operative assessment

## 2013-04-08 NOTE — Anesthesia Postprocedure Evaluation (Signed)
Anesthesia Post Note  Patient: Amanda Adkins  Procedure(s) Performed: Procedure(s) (LRB): OPEN REDUCTION INTERNAL (ORIF) FIXATION PATELLA (Left)  Anesthesia type: General  Patient location: PACU  Post pain: Pain level controlled and Adequate analgesia  Post assessment: Post-op Vital signs reviewed, Patient's Cardiovascular Status Stable, Respiratory Function Stable, Patent Airway and Pain level controlled  Last Vitals:  Filed Vitals:   04/08/13 2159  BP: 155/94  Pulse: 91  Temp: 36.8 C  Resp: 23    Post vital signs: Reviewed and stable  Level of consciousness: awake, alert  and oriented  Complications: No apparent anesthesia complications

## 2013-04-08 NOTE — Transfer of Care (Signed)
Immediate Anesthesia Transfer of Care Note  Patient: Amanda Adkins  Procedure(s) Performed: Procedure(s) with comments: OPEN REDUCTION INTERNAL (ORIF) FIXATION PATELLA (Left) - Open Reduction Internal Fixation Left Patella Fracture  Patient Location: PACU  Anesthesia Type:General  Level of Consciousness: sedated and patient cooperative  Airway & Oxygen Therapy: Patient Spontanous Breathing and Patient connected to nasal cannula oxygen  Post-op Assessment: Report given to PACU RN, Post -op Vital signs reviewed and stable and Patient moving all extremities X 4  Post vital signs: Reviewed and stable  Complications: No apparent anesthesia complications
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
## 2013-04-08 NOTE — ED Provider Notes (Signed)
Pt with fall onto her L knee - is constant moderate pain that is sevre with ambulation - she has been trying to walk on this knee X several days with her walker but has increased pain and swelling.  On my exam there is sweling and ttp over the patella on the L.  She cannot straight leg raise -   Admit to hospital for repair.  Medical screening examination/treatment/procedure(s) were conducted as a shared visit with non-physician practitioner(s) and myself.  I personally evaluated the patient during the encounter    Vida Roller, MD 04/08/13 [phone]

## 2013-04-08 NOTE — Progress Notes (Signed)
UR COMPLETED  

## 2013-04-08 NOTE — Brief Op Note (Signed)
04/07/2013 - 04/08/2013  10:04 PM  PATIENT:  Amanda Adkins  72 y.o. female  PRE-OPERATIVE DIAGNOSIS:  Left Patella Fracture  POST-OPERATIVE DIAGNOSIS:  Left Patella Fracture  PROCEDURE:  Procedure(s) with comments: OPEN REDUCTION INTERNAL (ORIF) FIXATION PATELLA (Left) - Open Reduction Internal Fixation Left Patella Fracture  SURGEON:  Surgeon(s) and Role:    * Eldred Manges, MD - Primary  PHYSICIAN ASSISTANT:   ASSISTANTS: none   ANESTHESIA:   local and general  EBL:  Total I/O In: -  Out: 300 [Urine:250; Blood:50]  BLOOD ADMINISTERED:none  DRAINS: none   LOCAL MEDICATIONS USED:  MARCAINE     SPECIMEN:  No Specimen  DISPOSITION OF SPECIMEN:  N/A  COUNTS:  YES  TOURNIQUET:   Total Tourniquet Time Documented: Thigh (Left) - 34 minutes Total: Thigh (Left) - 34 minutes   DICTATION: .Other Dictation: Dictation Number [id number]  PLAN OF CARE: still inpatient  PATIENT DISPOSITION:  PACU - hemodynamically stable.   Delay start of Pharmacological VTE agent (>24hrs) due to surgical blood loss or risk of bleeding: no

## 2013-04-08 NOTE — Anesthesia Preprocedure Evaluation (Signed)
Anesthesia Evaluation  Patient identified by MRN, date of birth, ID band Patient awake    Reviewed: Allergy & Precautions, H&P , NPO status , Patient's Chart, lab work & pertinent test results  Airway Mallampati: II  Neck ROM: full    Dental   Pulmonary shortness of breath, Current Smoker,          Cardiovascular hypertension,     Neuro/Psych Anxiety    GI/Hepatic GERD-  ,  Endo/Other  diabetes, Type 2  Renal/GU      Musculoskeletal   Abdominal   Peds  Hematology   Anesthesia Other Findings   Reproductive/Obstetrics                           Anesthesia Physical Anesthesia Plan  ASA: III  Anesthesia Plan: General and Regional   Post-op Pain Management: MAC Combined w/ Regional for Post-op pain   Induction: Intravenous  Airway Management Planned: Oral ETT  Additional Equipment:   Intra-op Plan:   Post-operative Plan: Extubation in OR  Informed Consent: I have reviewed the patients History and Physical, chart, labs and discussed the procedure including the risks, benefits and alternatives for the proposed anesthesia with the patient or authorized representative who has indicated his/her understanding and acceptance.     Plan Discussed with: CRNA, Anesthesiologist and Surgeon  Anesthesia Plan Comments:         Anesthesia Quick Evaluation

## 2013-04-09 LAB — CREATININE, SERUM
Creatinine, Ser: 0.69 mg/dL (ref 0.50–1.10)
GFR calc non Af Amer: 85 mL/min — ABNORMAL LOW (ref >90)

## 2013-04-09 MED ORDER — PNEUMOCOCCAL VAC POLYVALENT 25 MCG/0.5ML IJ INJ
0.5000 mL | INJECTION | INTRAMUSCULAR | Status: AC
  Administered 2013-04-10: 0.5 mL via INTRAMUSCULAR
  Filled 2013-04-09: qty 0.5

## 2013-04-09 NOTE — Evaluation (Signed)
Occupational Therapy Evaluation Patient Details Name: Amanda Adkins MRN: [id number] DOB: May 13, 1941 Today's Date: 04/09/2013 Time: 4696-2952 OT Time Calculation (min): 17 min  OT Assessment / Plan / Recommendation Clinical Impression    Patient s/p ORIF of patella fx secondary to fall. patient limited by dementia/confusion, unsure if at baseline. Per chart patient lives with daughter and feel patient will be able to return home if daughter can provide necessary support/assistance. patient did well with mobility with max encouragement. Definitely distracted by knee immobilizer (wanted it off!) Pt will benefit from acute OT to increase independence prior to d/c. Recommend HHOT upon d/c.     OT Assessment  Patient needs continued OT Services    Follow Up Recommendations  Home health OT;Supervision/Assistance - 24 hour    Barriers to Discharge      Equipment Recommendations  Other (comment) (tbd)    Recommendations for Other Services    Frequency  Min 2X/week    Precautions / Restrictions Precautions Precautions: Fall;Other (comment) Precaution Comments: Knee immobilizer tight when up; may do strengthening and gentle ROM Required Braces or Orthoses: Knee Immobilizer - Left Knee Immobilizer - Left: On when out of bed or walking Restrictions Weight Bearing Restrictions: Yes LLE Weight Bearing: Weight bearing as tolerated   Pertinent Vitals/Pain Patient complained of the knee immobilizer being uncomfortable on knee. Repositioned as best as possible.    ADL  Eating/Feeding: Minimal assistance (due to confusion?) Where Assessed - Eating/Feeding: Chair Grooming: Minimal assistance Where Assessed - Grooming: Supported sitting Upper Body Bathing: Moderate assistance Where Assessed - Upper Body Bathing: Supported sitting Lower Body Bathing: Maximal assistance Where Assessed - Lower Body Bathing: Supported sit to stand Upper Body Dressing: Moderate assistance Where Assessed - Upper  Body Dressing: Supported sitting Lower Body Dressing: Maximal assistance;+1 Total assistance Where Assessed - Lower Body Dressing: Supported sit to Pharmacist, hospital: Performed;Minimal Dentist Method: Sit to Barista: Raised toilet seat with arms (or 3-in-1 over toilet) Toileting - Clothing Manipulation and Hygiene: +1 Total assistance Where Assessed - Toileting Clothing Manipulation and Hygiene: Sit to stand from 3-in-1 or toilet Tub/Shower Transfer Method: Not assessed Equipment Used: Gait belt;Knee Immobilizer;Rolling walker Transfers/Ambulation Related to ADLs: Min A ADL Comments: Pt requiring total A for toilet hygiene/clothing. Pt agitated by knee immobilizer. Pt participated with encouragement. Pt at overall Max A/Total A for LB ADLs with sit to stand transfer.     OT Diagnosis: Acute pain;Cognitive deficits  OT Problem List: Decreased cognition;Decreased knowledge of use of DME or AE;Decreased knowledge of precautions;Pain;Decreased strength;Decreased range of motion;Decreased activity tolerance OT Treatment Interventions: Self-care/ADL training;DME and/or AE instruction;Therapeutic activities;Patient/family education   OT Goals Acute Rehab OT Goals OT Goal Formulation: Patient unable to participate in goal setting Time For Goal Achievement: 04/16/13 Potential to Achieve Goals: Good ADL Goals Pt Will Perform Grooming: with supervision;Standing at sink ADL Goal: Grooming - Progress: Goal set today Pt Will Perform Upper Body Bathing: with min assist;Sitting, chair ADL Goal: Upper Body Bathing - Progress: Goal set today Pt Will Perform Lower Body Bathing: with min assist;Sit to stand from chair ADL Goal: Lower Body Bathing - Progress: Goal set today Pt Will Perform Upper Body Dressing: with min assist;Sitting, chair;Sitting, bed ADL Goal: Upper Body Dressing - Progress: Goal set today Pt Will Perform Lower Body Dressing: with min  assist;Sit to stand from bed;Sit to stand from chair ADL Goal: Lower Body Dressing - Progress: Goal set today Pt Will Transfer to Toilet: with supervision;Ambulation  ADL Goal: Statistician - Progress: Goal set today Pt Will Perform Toileting - Clothing Manipulation: with supervision;Standing ADL Goal: Toileting - Clothing Manipulation - Progress: Goal set today Pt Will Perform Toileting - Hygiene: with supervision;Sit to stand from 3-in-1/toilet;Sitting on 3-in-1 or toilet ADL Goal: Toileting - Hygiene - Progress: Goal set today  Visit Information  Last OT Received On: 04/09/13 Assistance Needed: +2 PT/OT Co-Evaluation/Treatment: Yes    Subjective Data      Prior Functioning     Home Living Lives With: Daughter (per chart, unable to assess home situation due to dementia) Communication Communication: No difficulties         Vision/Perception     Cognition  Cognition Arousal/Alertness: Awake/alert Behavior During Therapy: Agitated Overall Cognitive Status: No family/caregiver present to determine baseline cognitive functioning    Extremity/Trunk Assessment Right Upper Extremity Assessment RUE ROM/Strength/Tone: Deficits RUE ROM/Strength/Tone Deficits: very limited ROM with shoulder flexion-per chart has chronic pain in both shoulders Left Upper Extremity Assessment LUE ROM/Strength/Tone: WFL for tasks assessed     Mobility Transfers Transfers: Sit to Stand;Stand to Sit Sit to Stand: 4: Min assist;With upper extremity assist;From chair/3-in-1 Stand to Sit: 4: Min assist;With upper extremity assist;To chair/3-in-1 Details for Transfer Assistance: required max verbal cues and encouragement for mobility; hand placement; sequencing     Exercise   Balance   End of Session OT - End of Session Equipment Utilized During Treatment: Gait belt;Left knee immobilizer Activity Tolerance: Treatment limited secondary to agitation Patient left: Other (comment);in chair (with  PT)  GO     Earlie Raveling OTR/L [phone] 04/09/2013, 10:44 AM

## 2013-04-09 NOTE — Op Note (Signed)
NAMEJANARIA, Amanda Adkins                ACCOUNT NO.:  [id number]  MEDICAL RECORD NO.:  [id number]  LOCATION:  5N08C                        FACILITY:  [facility]  PHYSICIAN:  Deborahann Poteat C. Ophelia Adkins, M.D.    DATE OF BIRTH:  06-30-1941  DATE OF PROCEDURE:  04/08/2013 DATE OF DISCHARGE:                              OPERATIVE REPORT   PREOPERATIVE DIAGNOSIS:  Transverse comminuted patellar fracture, left.  POSTOPERATIVE DIAGNOSIS:  Transverse comminuted patellar fracture, left.  PROCEDURE:  Open reduction and internal fixation, left patella, Biomet 4.0 partially threaded screws with 20-gauge figure-of-eight tension band wiring.  SURGEON:  Danasha Melman C. Ophelia Charter, MD  ANESTHESIA:  General.  TOURNIQUET TIME:  Less than 40 minutes.  ESTIMATED BLOOD LOSS:  Minimal.  DESCRIPTION OF PROCEDURE:  After induction of anesthesia, standard prep and draping with the proximal thigh tourniquet, the leg was wrapped in Esmarch and tourniquet inflated.  Then, impervious stockinette and Coban had been applied.  Sterile skin marker, Betadine, Steri-Drape, Ancef prophylaxis.  Time-out procedure completed.  Incision was made at the midline.  Subperiosteal dissection on the patella was performed.  It appeared that the fracture may have been old with fibrous tissue present.  There was serous fluid inside the joint and actually no hemarthrosis present.  It was serosanguineous, suggesting __________ fibrous tissue between the fragments.  This was cut out to the ends of the patella, grasped with a towel clip.  Largest fragments pulled together and transverse fracture was fixed from distal-to-proximal __________ parallel drilled measured and then figure-of-eight 20-gauge wire passed around, tightened down securely.  Screws were 38 to 42 mm. __________ was tightened down using __________ for a fulcrum.  Final spot pictures were taken, showing excellent position alignment, reduction.  Irrigation with saline solution and tourniquet  deflation, closure with 2-0 Vicryl over the top of the patella.  Skin staple closure.  Postop dressing with Marcaine infiltration, Adaptic, 4x4s, Webril, Ace wrap, and knee immobilizer.  The patient was transferred to the recovery room in stable condition.     Amanda Adkins, M.D.    MCY/MEDQ  D:  04/08/2013  T:  04/09/2013  Job:  [id number]
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
## 2013-04-09 NOTE — Evaluation (Signed)
Physical Therapy Evaluation Patient Details Name: Amanda Adkins MRN: [id number] DOB: [birth year] Today's Date: 04/09/2013 Time: 4098-1191 PT Time Calculation (min): 29 min  PT Assessment / Plan / Recommendation Clinical Impression  Patient s/p ORIF of patella fx secondary to fall.  patient limited by dementia/confusion, unsure if at baseline.  Per chart patient lives with daughter and feel patient will be able to return home if daughter can provide necessary support/assistance.  patient did well with mobility with max encouragement.  Definitely distracted by knee immobilizer (wanted it off!)    PT Assessment  Patient needs continued PT services    Follow Up Recommendations  Home health PT;Supervision/Assistance - 24 hour    Does the patient have the potential to tolerate intense rehabilitation      Barriers to Discharge        Equipment Recommendations  Rolling walker with 5" wheels    Recommendations for Other Services     Frequency Min 3X/week    Precautions / Restrictions Precautions Precautions: Fall;Other (comment) Precaution Comments: Knee immobilizer tight when up; may do strengthening and gentle ROM Required Braces or Orthoses: Knee Immobilizer - Left Knee Immobilizer - Left: On when out of bed or walking Restrictions Weight Bearing Restrictions: Yes LLE Weight Bearing: Weight bearing as tolerated   Pertinent Vitals/Pain Patient complained of the knee immobilizer being uncomfortable, but not really any pain      Mobility  Transfers Transfers: Sit to Stand;Stand to Sit Sit to Stand: 4: Min assist;With upper extremity assist;From chair/3-in-1 Stand to Sit: 4: Min assist;With upper extremity assist;To chair/3-in-1 Details for Transfer Assistance: required max verbal cues and encouragement for mobility; hand placement; sequencing Ambulation/Gait Ambulation/Gait Assistance: 4: Min assist Ambulation Distance (Feet): 10 Feet Assistive device: Rolling  walker Ambulation/Gait Assistance Details: required max verbal cues and encouragement for mobility; sequencing Gait Pattern: Step-to pattern    Exercises General Exercises - Lower Extremity Ankle Circles/Pumps: AAROM;Strengthening;Both;10 reps;Seated   PT Diagnosis: Difficulty walking  PT Problem List: Decreased activity tolerance;Decreased strength;Decreased range of motion;Decreased mobility;Decreased cognition;Decreased knowledge of use of DME PT Treatment Interventions: Gait training;DME instruction;Functional mobility training;Therapeutic activities;Therapeutic exercise   PT Goals Acute Rehab PT Goals PT Goal Formulation: Patient unable to participate in goal setting Time For Goal Achievement: 04/16/13 Potential to Achieve Goals: Good Pt will go Supine/Side to Sit: with supervision;with HOB 0 degrees PT Goal: Supine/Side to Sit - Progress: Goal set today Pt will go Sit to Supine/Side: with supervision;with HOB 0 degrees PT Goal: Sit to Supine/Side - Progress: Goal set today Pt will go Sit to Stand: with supervision;with upper extremity assist PT Goal: Sit to Stand - Progress: Goal set today Pt will go Stand to Sit: with supervision;with upper extremity assist PT Goal: Stand to Sit - Progress: Goal set today Pt will Ambulate: 51 - 150 feet;with supervision;with rolling walker;with least restrictive assistive device PT Goal: Ambulate - Progress: Goal set today Pt will Perform Home Exercise Program: with min assist PT Goal: Perform Home Exercise Program - Progress: Goal set today  Visit Information  Last PT Received On: 04/09/13 Assistance Needed: +2 PT/OT Co-Evaluation/Treatment: Yes    Subjective Data  Subjective: "Get this damn thing off of me" (speaking of the knee immobilizer) Patient Stated Goal: None stated   Prior Functioning  Home Living Lives With: Daughter (per chart, unable to assess home situation due to dementia) Communication Communication: No difficulties     Cognition  Cognition Arousal/Alertness: Awake/alert Behavior During Therapy: Agitated Overall Cognitive Status: No family/caregiver  present to determine baseline cognitive functioning    Extremity/Trunk Assessment Right Lower Extremity Assessment RLE ROM/Strength/Tone: [facility] for tasks assessed Left Lower Extremity Assessment LLE ROM/Strength/Tone: Unable to fully assess;Due to precautions;Due to pain   Balance Balance Balance Assessed: No  End of Session PT - End of Session Equipment Utilized During Treatment: Gait belt;Left knee immobilizer Activity Tolerance: Treatment limited secondary to agitation Patient left: in chair;with call bell/phone within reach;with chair alarm set Nurse Communication: Mobility status  GP     Olivia Canter, [place] [phone] 04/09/2013, 10:06 AM

## 2013-04-09 NOTE — Progress Notes (Signed)
Subjective: Pt stable - pain controlled   Objective: Vital signs in last 24 hours: Temp:  [97.6 F (36.4 C)-98.9 F (37.2 C)] 97.6 F (36.4 C) (05/31 0500) Pulse Rate:  [75-91] 75 (05/31 0500) Resp:  [12-23] 18 (05/31 0500) BP: (109-177)/(49-99) 125/72 mmHg (05/31 0500) SpO2:  [94 %-100 %] 98 % (05/31 0500)  Intake/Output from previous day: 05/30 0701 - 05/31 0700 In: 750 [I.V.:700] Out: 1300 [Urine:1250; Blood:50] Intake/Output this shift: Total I/O In: 300 [Other:300] Out: -   Exam:  Sensation intact distally Intact pulses distally Dorsiflexion/Plantar flexion intact  Labs:  Recent Labs  04/07/13 1431 04/08/13 0515 04/08/13 2320  HGB 11.2* 11.2* 11.9*    Recent Labs  04/08/13 0515 04/08/13 2320  WBC 7.9 10.2  RBC 3.84* 4.07  HCT 33.7* 35.9*  PLT 230 250    Recent Labs  04/07/13 1431 04/08/13 0515 04/08/13 2320  NA 136 140  --   K 3.2* 3.9  --   CL 97 101  --   CO2 25 29  --   BUN 16 14  --   CREATININE 0.74 0.77 0.69  GLUCOSE 56* 126*  --   CALCIUM 9.8 9.7  --    No results found for this basename: LABPT, INR,  in the last 72 hours  Assessment/Plan: Pt will likely be slow to mobilize and will need sw consult for placement - PT today - hl iv   Dewana Ammirati SCOTT 04/09/2013, 7:54 AM

## 2013-04-10 LAB — CBC
Hemoglobin: 12.2 g/dL (ref 12.0–15.0)
MCH: 29 pg (ref 26.0–34.0)
MCHC: 33.4 g/dL (ref 30.0–36.0)
RDW: 14.7 % (ref 11.5–15.5)

## 2013-04-10 LAB — BASIC METABOLIC PANEL
BUN: 11 mg/dL (ref 6–23)
Calcium: 9.7 mg/dL (ref 8.4–10.5)
Creatinine, Ser: 0.66 mg/dL (ref 0.50–1.10)
GFR calc non Af Amer: 86 mL/min — ABNORMAL LOW (ref >90)
Glucose, Bld: 103 mg/dL — ABNORMAL HIGH (ref 70–99)
Potassium: 3.7 mEq/L (ref 3.5–5.1)

## 2013-04-10 LAB — GLUCOSE, CAPILLARY
Glucose-Capillary: 101 mg/dL — ABNORMAL HIGH (ref 70–99)
Glucose-Capillary: 159 mg/dL — ABNORMAL HIGH (ref 70–99)

## 2013-04-10 MED ORDER — POLYETHYLENE GLYCOL 3350 17 G PO PACK
17.0000 g | PACK | Freq: Every day | ORAL | Status: DC
  Administered 2013-04-11 – 2013-04-12 (×2): 17 g via ORAL
  Filled 2013-04-10 (×3): qty 1

## 2013-04-10 MED ORDER — MAGNESIUM HYDROXIDE 400 MG/5ML PO SUSP: 30.0000 mL | Freq: Once | ORAL | Status: DC

## 2013-04-10 NOTE — Progress Notes (Signed)
Subjective: Pt stable - has been up   Objective: Vital signs in last 24 hours: Temp:  [98.7 F (37.1 C)-99.7 F (37.6 C)] 98.7 F (37.1 C) (06/01 0526) Pulse Rate:  [76-87] 85 (06/01 0526) Resp:  [18] 18 (06/01 0526) BP: (155-171)/(82-97) 155/97 mmHg (06/01 0526) SpO2:  [95 %-100 %] 99 % (06/01 0526)  Intake/Output from previous day: 05/31 0701 - 06/01 0700 In: 935.8 [P.O.:240; I.V.:395.8] Out: -  Intake/Output this shift:    Exam:  Neurovascular intact Sensation intact distally Intact pulses distally  Labs:  Recent Labs  04/07/13 1431 04/08/13 0515 04/08/13 2320 04/10/13 0430  HGB 11.2* 11.2* 11.9* 12.2    Recent Labs  04/08/13 2320 04/10/13 0430  WBC 10.2 12.1*  RBC 4.07 4.20  HCT 35.9* 36.5  PLT 250 271    Recent Labs  04/08/13 0515 04/08/13 2320 04/10/13 0430  NA 140  --  134*  K 3.9  --  3.7  CL 101  --  97  CO2 29  --  28  BUN 14  --  11  CREATININE 0.77 0.69 0.66  GLUCOSE 126*  --  103*  CALCIUM 9.7  --  9.7   No results found for this basename: LABPT, INR,  in the last 72 hours  Assessment/Plan: Plan for snf this week - moving better   DEAN,GREGORY SCOTT 04/10/2013, 12:24 PM
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
## 2013-04-11 ENCOUNTER — Encounter (HOSPITAL_COMMUNITY): Payer: Self-pay | Admitting: Orthopaedic Surgery

## 2013-04-11 LAB — GLUCOSE, CAPILLARY: Glucose-Capillary: 98 mg/dL (ref 70–99)

## 2013-04-11 MED ORDER — TRAMADOL HCL 50 MG PO TABS: 50.0000 mg | ORAL_TABLET | Freq: Three times a day (TID) | ORAL | Status: DC | PRN
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
## 2013-04-11 NOTE — Progress Notes (Addendum)
Occupational Therapy Treatment Patient Details Name: Amanda Adkins MRN: [id number] DOB: 22-Dec-1940 Today's Date: 04/11/2013 Time: 6578-4696 OT Time Calculation (min): 26 min  OT Assessment / Plan / Recommendation Comments on Treatment Session  Pt ambulated to bathroom to perform toileting and performed grooming at sink. Pt much more tolerable of knee immobilizer today during session. Pt requiring +2 Total A for bed mobility. Recommending SNF for d/c, however spoke with case manager and pt is refusing and insisting on going home. HHOT in place.     Follow Up Recommendations  SNF;Supervision/Assistance - 24 hour    Barriers to Discharge       Equipment Recommendations   (tbd)    Recommendations for Other Services    Frequency Min 2X/week   Plan Discharge plan needs to be updated    Precautions / Restrictions Precautions Precautions: Fall;Other (comment) Precaution Comments: Knee immobilizer tight when up; may do strengthening and gentle ROM Required Braces or Orthoses: Knee Immobilizer - Left Knee Immobilizer - Left: On when out of bed or walking Restrictions Weight Bearing Restrictions: Yes LLE Weight Bearing: Weight bearing as tolerated   Pertinent Vitals/Pain Pain in knee, but did not rate. Nurse notified.     ADL  Grooming: Performed;Wash/dry hands;Min guard Where Assessed - Grooming: Supported Copywriter, advertising: Performed;Minimal Dentist Method: Sit to Barista: Raised toilet seat with arms (or 3-in-1 over toilet) Toileting - Clothing Manipulation and Hygiene: Maximal assistance Where Assessed - Engineer, mining and Hygiene: Sit to stand from 3-in-1 or toilet Equipment Used: Gait belt;Knee Immobilizer;Rolling walker Transfers/Ambulation Related to ADLs: Min A for transfers and ambulation ADL Comments: Pt at overall Max A level for toileting clothing manipulation/hygiene. Pt unable to void once sitting on 3  in 1, but OT had pt practice hygiene. Pt bending very far foward to reach buttock requiring assistance for balance while standing. Total A for clothing manipulation. Pt washed hands at sink with minguard assist while standing supported.    OT Diagnosis:    OT Problem List:   OT Treatment Interventions:     OT Goals Acute Rehab OT Goals OT Goal Formulation: Patient unable to participate in goal setting Time For Goal Achievement: 04/16/13 Potential to Achieve Goals: Good ADL Goals Pt Will Perform Grooming: with supervision;Standing at sink ADL Goal: Grooming - Progress: Progressing toward goals Pt Will Perform Upper Body Bathing: with min assist;Sitting, chair Pt Will Perform Lower Body Bathing: with min assist;Sit to stand from chair Pt Will Perform Upper Body Dressing: with min assist;Sitting, chair;Sitting, bed Pt Will Perform Lower Body Dressing: with min assist;Sit to stand from bed;Sit to stand from chair Pt Will Transfer to Toilet: with supervision;Ambulation ADL Goal: Toilet Transfer - Progress: Progressing toward goals Pt Will Perform Toileting - Clothing Manipulation: with supervision;Standing ADL Goal: Toileting - Clothing Manipulation - Progress: Progressing toward goals Pt Will Perform Toileting - Hygiene: with supervision;Sit to stand from 3-in-1/toilet;Sitting on 3-in-1 or toilet ADL Goal: Toileting - Hygiene - Progress: Progressing toward goals  Visit Information  Last OT Received On: 04/11/13 Assistance Needed: +2    Subjective Data      Prior Functioning       Cognition  Cognition Arousal/Alertness: Awake/alert Behavior During Therapy: WFL for tasks assessed/performed Overall Cognitive Status: No family/caregiver present to determine baseline cognitive functioning (not oriented; difficulty following commands)    Mobility  Bed Mobility Bed Mobility: Supine to Sit;Sitting - Scoot to Edge of Bed Supine to Sit: 1: +2 Total  assist;HOB flat Supine to Sit:  Patient Percentage: 70% Sitting - Scoot to Edge of Bed: 3: Mod assist Details for Bed Mobility Assistance: Asssitance to lift trunk and assist with LLE when going from supine to sit. Pt requiring assistance to scoot. Transfers Transfers: Sit to Stand;Stand to Sit Sit to Stand: 4: Min assist;With upper extremity assist;From bed;From chair/3-in-1 Stand to Sit: 4: Min assist;To chair/3-in-1 Details for Transfer Assistance: Cues for positioning of LLE, sequencing, and hand placement.    Exercises      Balance     End of Session OT - End of Session Equipment Utilized During Treatment: Gait belt;Left knee immobilizer Activity Tolerance: Patient tolerated treatment well Patient left: in chair;with call bell/phone within reach;with chair alarm set Nurse Communication: Mobility status  GO     Earlie Raveling OTR/L [phone] 04/11/2013, 4:08 PM

## 2013-04-11 NOTE — Progress Notes (Signed)
Subjective: 3 Days Post-Op Procedure(s) (LRB): OPEN REDUCTION INTERNAL (ORIF) FIXATION PATELLA (Left) Patient reports pain as mild.    Objective: Vital signs in last 24 hours: Temp:  [98.9 F (37.2 C)-99.6 F (37.6 C)] 98.9 F (37.2 C) (06/02 0546) Pulse Rate:  [81-94] 81 (06/02 0546) Resp:  [16-18] 16 (06/02 0546) BP: (145-177)/(71-100) 145/71 mmHg (06/02 0546) SpO2:  [91 %-97 %] 91 % (06/02 0546)  Intake/Output from previous day: 06/01 0701 - 06/02 0700 In: 177 [P.O.:177] Out: -  Intake/Output this shift:     Recent Labs  04/08/13 2320 04/10/13 0430  HGB 11.9* 12.2    Recent Labs  04/08/13 2320 04/10/13 0430  WBC 10.2 12.1*  RBC 4.07 4.20  HCT 35.9* 36.5  PLT 250 271    Recent Labs  04/08/13 2320 04/10/13 0430  NA  --  134*  K  --  3.7  CL  --  97  CO2  --  28  BUN  --  11  CREATININE 0.69 0.66  GLUCOSE  --  103*  CALCIUM  --  9.7   No results found for this basename: LABPT, INR,  in the last 72 hours  Neurologically intact  Assessment/Plan: 3 Days Post-Op Procedure(s) (LRB): OPEN REDUCTION INTERNAL (ORIF) FIXATION PATELLA (Left) Up with therapy   SNF placement.   [name] C 04/11/2013, 9:22 AM

## 2013-04-11 NOTE — Progress Notes (Signed)
Physical Therapy Treatment Patient Details Name: Amanda Adkins MRN: [id number] DOB: December 26, 1940 Today's Date: 04/11/2013 Time: 4098-1191 PT Time Calculation (min): 24 min  PT Assessment / Plan / Recommendation Comments on Treatment Session  Patient making slow progress with PT.  Unsafe with gait with RW.  Would benefit from ST-SNF for continued therapy at discharge.  Spoke with CM - patient refusing SNF.  HHPT has been arranged.    Follow Up Recommendations  SNF;Supervision/Assistance - 24 hour (Patient refusing SNF - will need HHPT)     Does the patient have the potential to tolerate intense rehabilitation     Barriers to Discharge        Equipment Recommendations  Rolling walker with 5" wheels    Recommendations for Other Services    Frequency Min 3X/week   Plan Discharge plan needs to be updated;Frequency remains appropriate    Precautions / Restrictions Precautions Precautions: Fall;Other (comment) Precaution Comments: Knee immobilizer tight when up; may do strengthening and gentle ROM Required Braces or Orthoses: Knee Immobilizer - Left Knee Immobilizer - Left: On when out of bed or walking Restrictions Weight Bearing Restrictions: Yes LLE Weight Bearing: Weight bearing as tolerated   Pertinent Vitals/Pain     Mobility  Bed Mobility Bed Mobility: Supine to Sit;Sitting - Scoot to Edge of Bed Supine to Sit: 1: +2 Total assist;HOB flat Supine to Sit: Patient Percentage: 70% Sitting - Scoot to Edge of Bed: 3: Mod assist Details for Bed Mobility Assistance: Asssitance to lift trunk and assist with LLE when going from supine to sit. Pt requiring assistance to scoot. Transfers Transfers: Sit to Stand;Stand to Sit Sit to Stand: 4: Min assist;With upper extremity assist;From bed;From chair/3-in-1 Stand to Sit: 4: Min assist;To chair/3-in-1 Details for Transfer Assistance: Cues for positioning of LLE, sequencing, and hand placement. Ambulation/Gait Ambulation/Gait  Assistance: 4: Min assist (+1 for chair) Ambulation Distance (Feet): 20 Feet Assistive device: Rolling walker Ambulation/Gait Assistance Details: Verbal cues for safe use of RW, to remain close to RW, and sequencing.   Gait Pattern: Step-to pattern;Decreased stance time - left;Decreased step length - right;Shuffle;Trunk flexed Gait velocity: Slow gait speed General Gait Details: Patient unsafe with gait, leaning forward on RW with forearms.      PT Goals Acute Rehab PT Goals Pt will go Sit to Stand: with supervision;with upper extremity assist PT Goal: Sit to Stand - Progress: Progressing toward goal Pt will go Stand to Sit: with supervision;with upper extremity assist PT Goal: Stand to Sit - Progress: Progressing toward goal Pt will Ambulate: 51 - 150 feet;with supervision;with rolling walker;with least restrictive assistive device PT Goal: Ambulate - Progress: Progressing toward goal  Visit Information  Last PT Received On: 04/11/13 Assistance Needed: +2    Subjective Data  Subjective: "It is your fault.  Francesca Oman are going too fast for me"   Cognition  Cognition Arousal/Alertness: Awake/alert Behavior During Therapy: WFL for tasks assessed/performed Overall Cognitive Status: No family/caregiver present to determine baseline cognitive functioning (not oriented; difficulty following commands)    Balance     End of Session PT - End of Session Equipment Utilized During Treatment: Gait belt;Left knee immobilizer Activity Tolerance: Patient limited by pain;Patient limited by fatigue;Treatment limited secondary to agitation Patient left: in chair;with call bell/phone within reach;with chair alarm set Nurse Communication: Mobility status   GP     Vena Austria 04/11/2013, 4:13 PM Durenda Hurt. Renaldo Fiddler, [facility] Acute Rehab Services Pager [phone]
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
## 2013-04-12 NOTE — Progress Notes (Signed)
Physical Therapy Treatment Patient Details Name: Amanda Adkins MRN: [id number] DOB: 10-18-1941 Today's Date: 04/12/2013 Time: 8295-6213 PT Time Calculation (min): 20 min  PT Assessment / Plan / Recommendation Comments on Treatment Session  Pt with poor sequence and form with gait.  Per notes, family is now agreeable to SNF and is looking at facilities today.  Pt would benefit from SNF.    Follow Up Recommendations  SNF     Does the patient have the potential to tolerate intense rehabilitation     Barriers to Discharge        Equipment Recommendations  Rolling walker with 5" wheels (if she goes home with HHPT)    Recommendations for Other Services    Frequency Min 3X/week   Plan Discharge plan needs to be updated;Frequency remains appropriate    Precautions / Restrictions Precautions Precautions: Fall;Other (comment) Precaution Comments: Knee immobilizer tight when up; may do strengthening and gentle ROM Required Braces or Orthoses: Knee Immobilizer - Left Knee Immobilizer - Left: On when out of bed or walking Restrictions Weight Bearing Restrictions: Yes LLE Weight Bearing: Weight bearing as tolerated   Pertinent Vitals/Pain 2/10 based on FACES scale    Mobility  Transfers Transfers: Sit to Stand Sit to Stand: 4: Min guard Stand to Sit: 4: Min assist Details for Transfer Assistance: Pt up in chair upon arrival.  Cues for technique, sequence and hand placement. Ambulation/Gait Ambulation/Gait Assistance: 4: Min assist Ambulation Distance (Feet): 40 Feet Assistive device: Rolling walker Ambulation/Gait Assistance Details: Cues for safe technique with RW placement and proper sequence.  Pt had difficulty following. Gait Pattern: Step-to pattern;Trunk flexed    Exercises General Exercises - Lower Extremity Ankle Circles/Pumps: AROM Heel Slides: PROM;AAROM;5 reps;Other (comment);Left (gentle AAROM/PROM) Hip ABduction/ADduction: AAROM;Left;10 reps   PT Diagnosis:    PT  Problem List:   PT Treatment Interventions:     PT Goals Acute Rehab PT Goals PT Goal: Sit to Stand - Progress: Progressing toward goal PT Goal: Stand to Sit - Progress: Progressing toward goal PT Goal: Ambulate - Progress: Progressing toward goal PT Goal: Perform Home Exercise Program - Progress: Progressing toward goal  Visit Information  Last PT Received On: 04/12/13 Assistance Needed: +2    Subjective Data  Subjective: "I don't know what is going on."   Cognition       Balance     End of Session PT - End of Session Equipment Utilized During Treatment: Gait belt;Left knee immobilizer Activity Tolerance: Patient tolerated treatment well Patient left: in chair;with chair alarm set   GP     Amanda Adkins 04/12/2013, 11:55 AM

## 2013-04-12 NOTE — Progress Notes (Addendum)
Social Worker assisted with patient discharge to skilled nursing facility, [facility] SW addressed all family questions and concerns. SW copied chart and added all important documents. SW also set up patient transportation with [employer]. Clinical Social Worker will sign off for now as social work intervention is no longer needed.   Sabino Niemann, MSW [phone]

## 2013-04-12 NOTE — Progress Notes (Deleted)
Clinical Social Work Department  BRIEF PSYCHOSOCIAL ASSESSMENT  Patient:Amanda Adkins Account Number: [id number] Admit date: 04/07/13  Clinical Social Worker Ashton Sabine, MSW Date/Time: 04/12/2013 9:30 AM  Referred by: Physician Date Referred: 04/12/2013  Referred for   SNF Placement   Other Referral:  Interview type: Patient and patient's granddaughter  Other interview type:  PSYCHOSOCIAL DATA  Living Status: Family  Admitted from facility:  Level of care:  Primary support name: Brown,Tiffany  Primary support relationship to patient: Friend  Degree of support available:  Strong and vested   CURRENT CONCERNS  Current Concerns   Post-Acute Placement   Other Concerns:  SOCIAL WORK ASSESSMENT / PLAN  CSW met with pt and patients granddaughterre: PT recommendation for SNF.   Pt lives with her family   CSW explained placement process and answered questions.   Pt Family reports [facility] as their preference   CSW completed FL2 and initiated SNF search.     Assessment/plan status: Information/Referral to Community Resources  Other assessment/ plan:  Information/referral to community resources:  SNF   PTAR   PATIENT'S/FAMILY'S RESPONSE TO PLAN OF CARE:  Pt was not oriented but patient's granddaughter reports she is agreeable to the patient going to ST SNF in order to increase strength and independence with mobility prior to returning home Pt verbalized understanding of placement process and appreciation for CSW assist.   Yisel Megill, MSW  [phone]  

## 2013-04-12 NOTE — Clinical Social Work Psychosocial (Signed)
Clinical Social Work Department  BRIEF PSYCHOSOCIAL ASSESSMENT  Patient:Amanda Adkins Account Number: [id number] Admit date: 04/07/13  Clinical Social Worker Sabino Niemann, MSW Date/Time: 04/12/2013 9:30 AM  Referred by: Physician Date Referred: 04/12/2013  Referred for   SNF Placement   Other Referral:  Interview type: Patient and patient's granddaughter  Other interview type:  PSYCHOSOCIAL DATA  Living Status: Family  Admitted from facility:  Level of care:  Primary support name: Brown,Tiffany  Primary support relationship to patient: Friend  Degree of support available:  Strong and vested   CURRENT CONCERNS  Current Concerns   Post-Acute Placement   Other Concerns:  SOCIAL WORK ASSESSMENT / PLAN  CSW met with pt and patients granddaughterre: PT recommendation for SNF.   Pt lives with her family   CSW explained placement process and answered questions.   Pt Family reports [facility] as their preference   CSW completed FL2 and initiated SNF search.     Assessment/plan status: Information/Referral to [facility]  Other assessment/ plan:  Information/referral to community resources:  SNF   PTAR   PATIENT'S/FAMILY'S RESPONSE TO PLAN OF CARE:  Pt was not oriented but patient's granddaughter reports she is agreeable to the patient going to ST SNF in order to increase strength and independence with mobility prior to returning home Pt verbalized understanding of placement process and appreciation for CSW assist.   Sabino Niemann, MSW  [phone]

## 2013-04-12 NOTE — Clinical Social Work Placement (Signed)
Clinical Social Work Department  CLINICAL SOCIAL WORK PLACEMENT NOTE  04/12/2013  Patient: Amanda Adkins  Account Number:  [id number] Admit date: 04/12/2013  Clinical Social Worker: Sabino Niemann, MSW  Date/time: 04/12/2013 10:30 AM  Clinical Social Work is seeking post-discharge placement for this patient at the following level of care: SKILLED NURSING (*CSW will update this form in Epic as items are completed)  04/12/2013 Patient/family provided with [facility] Department of Clinical Social Work's list of facilities offering this level of care within the geographic area requested by the patient (or if unable, by the patient's family).  04/12/2013 Patient/family informed of their freedom to choose among providers that offer the needed level of care, that participate in Medicare, Medicaid or managed care program needed by the patient, have an available bed and are willing to accept the patient.  6/3/2014Patient/family informed of [facility]' ownership interest in [facility], as well as of the fact that they are under no obligation to receive care at this facility.  PASARR submitted to EDS on 04/12/2013  PASARR number received from EDS on 04/12/2013 FL2 transmitted to all facilities in geographic area requested by pt/family on 04/12/2013 FL2 transmitted to all facilities within larger geographic area on  Patient informed that his/her managed care company has contracts with or will negotiate with certain facilities, including the following:  Patient/family informed of bed offers received: 04/12/2013 Patient chooses bed at [facility] Physician recommends and patient chooses bed at  Patient to be transferred to on 04/12/2013 Patient to be transferred to facility by [facility] The following physician request were entered in Epic:  Additional Comments:

## 2013-04-12 NOTE — Progress Notes (Signed)
Utilization review complete. Lalia Loudon RN CCM Case Mgmt phone [phone] 

## 2013-04-14 ENCOUNTER — Other Ambulatory Visit: Payer: Self-pay | Admitting: Internal Medicine

## 2013-05-11 ENCOUNTER — Telehealth: Payer: Self-pay | Admitting: *Deleted

## 2013-05-11 NOTE — Telephone Encounter (Signed)
Beth called and wanted verbal orders to go back out and see patient since she went to the hospital and [facility] for rehab. I verbally told her that was okay. Ask her to tell the patient that she needs an office visit

## 2013-05-12 ENCOUNTER — Telehealth: Payer: Self-pay | Admitting: Geriatric Medicine

## 2013-05-12 NOTE — Telephone Encounter (Signed)
Mrs Lowdermilk needs to come in for an appointment for new home health referral.

## 2013-05-18 ENCOUNTER — Encounter: Payer: Self-pay | Admitting: *Deleted

## 2013-05-19 ENCOUNTER — Other Ambulatory Visit: Payer: Self-pay | Admitting: Nurse Practitioner

## 2013-05-19 ENCOUNTER — Other Ambulatory Visit: Payer: Self-pay | Admitting: Geriatric Medicine

## 2013-05-19 ENCOUNTER — Ambulatory Visit (INDEPENDENT_AMBULATORY_CARE_PROVIDER_SITE_OTHER): Payer: Medicare Other | Admitting: Internal Medicine

## 2013-05-19 ENCOUNTER — Encounter: Payer: Self-pay | Admitting: Internal Medicine

## 2013-05-19 ENCOUNTER — Other Ambulatory Visit: Payer: Self-pay | Admitting: Internal Medicine

## 2013-05-19 VITALS — BP 136/82 | HR 67 | Temp 98.2°F | Resp 14 | Ht 62.0 in | Wt 181.6 lb

## 2013-05-19 DIAGNOSIS — G8929 Other chronic pain: Secondary | ICD-10-CM

## 2013-05-19 DIAGNOSIS — IMO0002 Reserved for concepts with insufficient information to code with codable children: Secondary | ICD-10-CM

## 2013-05-19 DIAGNOSIS — F32A Depression, unspecified: Secondary | ICD-10-CM

## 2013-05-19 DIAGNOSIS — I10 Essential (primary) hypertension: Secondary | ICD-10-CM

## 2013-05-19 DIAGNOSIS — M1710 Unilateral primary osteoarthritis, unspecified knee: Secondary | ICD-10-CM

## 2013-05-19 DIAGNOSIS — M25519 Pain in unspecified shoulder: Secondary | ICD-10-CM

## 2013-05-19 DIAGNOSIS — F22 Delusional disorders: Secondary | ICD-10-CM

## 2013-05-19 DIAGNOSIS — F0392 Unspecified dementia, unspecified severity, with psychotic disturbance: Secondary | ICD-10-CM

## 2013-05-19 DIAGNOSIS — E119 Type 2 diabetes mellitus without complications: Secondary | ICD-10-CM

## 2013-05-19 DIAGNOSIS — F3289 Other specified depressive episodes: Secondary | ICD-10-CM

## 2013-05-19 DIAGNOSIS — F329 Major depressive disorder, single episode, unspecified: Secondary | ICD-10-CM

## 2013-05-19 DIAGNOSIS — F0390 Unspecified dementia without behavioral disturbance: Secondary | ICD-10-CM

## 2013-05-19 DIAGNOSIS — S8290XS Unspecified fracture of unspecified lower leg, sequela: Secondary | ICD-10-CM

## 2013-05-19 DIAGNOSIS — F0150 Vascular dementia without behavioral disturbance: Secondary | ICD-10-CM

## 2013-05-19 DIAGNOSIS — M170 Bilateral primary osteoarthritis of knee: Secondary | ICD-10-CM

## 2013-05-19 DIAGNOSIS — F17200 Nicotine dependence, unspecified, uncomplicated: Secondary | ICD-10-CM

## 2013-05-19 DIAGNOSIS — Z720 Tobacco use: Secondary | ICD-10-CM

## 2013-05-19 DIAGNOSIS — S82002S Unspecified fracture of left patella, sequela: Secondary | ICD-10-CM

## 2013-05-19 MED ORDER — DULOXETINE HCL 30 MG PO CPEP: 30.0000 mg | ORAL_CAPSULE | Freq: Every day | ORAL | Status: DC

## 2013-05-19 MED ORDER — DONEPEZIL HCL 10 MG PO TABS: 10.0000 mg | ORAL_TABLET | Freq: Every day | ORAL | Status: DC
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
## 2013-05-20 LAB — CBC WITH DIFFERENTIAL/PLATELET
Basophils Absolute: 0 10*3/uL (ref 0.0–0.2)
Basos: 0 % (ref 0–3)
Eos: 3 % (ref 0–5)
Eosinophils Absolute: 0.3 10*3/uL (ref 0.0–0.4)
HCT: 36.4 % (ref 34.0–46.6)
Hemoglobin: 11.6 g/dL (ref 11.1–15.9)
Immature Grans (Abs): 0 10*3/uL (ref 0.0–0.1)
Immature Granulocytes: 0 % (ref 0–2)
Lymphocytes Absolute: 6.3 10*3/uL — ABNORMAL HIGH (ref 0.7–3.1)
Lymphs: 63 % — ABNORMAL HIGH (ref 14–46)
MCH: 28.9 pg (ref 26.6–33.0)
MCHC: 31.9 g/dL (ref 31.5–35.7)
MCV: 91 fL (ref 79–97)
Monocytes Absolute: 0.4 10*3/uL (ref 0.1–0.9)
Monocytes: 4 % (ref 4–12)
Neutrophils Absolute: 3 10*3/uL (ref 1.4–7.0)
Neutrophils Relative %: 30 % — ABNORMAL LOW (ref 40–74)
RBC: 4.02 x10E6/uL (ref 3.77–5.28)
RDW: 16 % — ABNORMAL HIGH (ref 12.3–15.4)
WBC: 9.9 10*3/uL (ref 3.4–10.8)

## 2013-05-20 LAB — BASIC METABOLIC PANEL
BUN/Creatinine Ratio: 25 (ref 11–26)
BUN: 22 mg/dL (ref 8–27)
CO2: 28 mmol/L (ref 18–29)
Calcium: 10.3 mg/dL — ABNORMAL HIGH (ref 8.6–10.2)
Chloride: 100 mmol/L (ref 97–108)
Creatinine, Ser: 0.87 mg/dL (ref 0.57–1.00)
GFR calc Af Amer: 77 mL/min/{1.73_m2} (ref >59)
GFR calc non Af Amer: 67 mL/min/{1.73_m2} (ref >59)
Glucose: 84 mg/dL (ref 65–99)
Potassium: 3.4 mmol/L — ABNORMAL LOW (ref 3.5–5.2)
Sodium: 142 mmol/L (ref 134–144)

## 2013-05-24 ENCOUNTER — Other Ambulatory Visit: Payer: Self-pay

## 2013-05-24 MED ORDER — ACCU-CHEK FASTCLIX LANCETS MISC: 1.0000 | Freq: Once | Status: DC

## 2013-05-25 ENCOUNTER — Other Ambulatory Visit: Payer: Self-pay | Admitting: Geriatric Medicine

## 2013-05-25 MED ORDER — ACCU-CHEK FASTCLIX LANCETS MISC: 1.0000 | Freq: Once | Status: DC

## 2013-05-25 MED ORDER — GLUCOSE BLOOD VI STRP: ORAL_STRIP | Status: DC

## 2013-05-31 ENCOUNTER — Other Ambulatory Visit: Payer: Self-pay | Admitting: Geriatric Medicine

## 2013-06-02 ENCOUNTER — Ambulatory Visit (INDEPENDENT_AMBULATORY_CARE_PROVIDER_SITE_OTHER): Payer: Medicare Other | Admitting: Internal Medicine

## 2013-06-02 ENCOUNTER — Encounter: Payer: Self-pay | Admitting: Internal Medicine

## 2013-06-02 VITALS — BP 130/80 | HR 70 | Temp 97.8°F | Resp 18 | Ht 62.0 in | Wt 179.0 lb

## 2013-06-02 DIAGNOSIS — F0390 Unspecified dementia without behavioral disturbance: Secondary | ICD-10-CM

## 2013-06-02 DIAGNOSIS — F32A Depression, unspecified: Secondary | ICD-10-CM

## 2013-06-02 DIAGNOSIS — S82002S Unspecified fracture of left patella, sequela: Secondary | ICD-10-CM

## 2013-06-02 DIAGNOSIS — S8290XS Unspecified fracture of unspecified lower leg, sequela: Secondary | ICD-10-CM

## 2013-06-02 DIAGNOSIS — F0392 Unspecified dementia, unspecified severity, with psychotic disturbance: Secondary | ICD-10-CM

## 2013-06-02 DIAGNOSIS — F329 Major depressive disorder, single episode, unspecified: Secondary | ICD-10-CM

## 2013-06-02 DIAGNOSIS — M25519 Pain in unspecified shoulder: Secondary | ICD-10-CM

## 2013-06-02 DIAGNOSIS — F22 Delusional disorders: Secondary | ICD-10-CM

## 2013-06-02 DIAGNOSIS — F3289 Other specified depressive episodes: Secondary | ICD-10-CM

## 2013-06-02 DIAGNOSIS — G8929 Other chronic pain: Secondary | ICD-10-CM

## 2013-06-02 DIAGNOSIS — E1149 Type 2 diabetes mellitus with other diabetic neurological complication: Secondary | ICD-10-CM
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
## 2013-06-05 ENCOUNTER — Encounter: Payer: Self-pay | Admitting: Internal Medicine

## 2013-06-06 ENCOUNTER — Emergency Department (HOSPITAL_COMMUNITY)
Admission: EM | Admit: 2013-06-06 | Discharge: 2013-06-07 | Disposition: A | Discharge status: Skilled Nursing Facility | Payer: Medicare Other | Attending: Emergency Medicine | Admitting: Emergency Medicine

## 2013-06-06 ENCOUNTER — Telehealth: Payer: Self-pay | Admitting: Geriatric Medicine

## 2013-06-06 ENCOUNTER — Encounter (HOSPITAL_COMMUNITY): Payer: Self-pay | Admitting: *Deleted

## 2013-06-06 DIAGNOSIS — Z79899 Other long term (current) drug therapy: Secondary | ICD-10-CM | POA: Insufficient documentation

## 2013-06-06 DIAGNOSIS — K219 Gastro-esophageal reflux disease without esophagitis: Secondary | ICD-10-CM | POA: Insufficient documentation

## 2013-06-06 DIAGNOSIS — Z9889 Other specified postprocedural states: Secondary | ICD-10-CM | POA: Insufficient documentation

## 2013-06-06 DIAGNOSIS — I10 Essential (primary) hypertension: Secondary | ICD-10-CM | POA: Insufficient documentation

## 2013-06-06 DIAGNOSIS — F4310 Post-traumatic stress disorder, unspecified: Secondary | ICD-10-CM | POA: Insufficient documentation

## 2013-06-06 DIAGNOSIS — R627 Adult failure to thrive: Secondary | ICD-10-CM | POA: Insufficient documentation

## 2013-06-06 DIAGNOSIS — F0280 Dementia in other diseases classified elsewhere without behavioral disturbance: Secondary | ICD-10-CM | POA: Insufficient documentation

## 2013-06-06 DIAGNOSIS — IMO0002 Reserved for concepts with insufficient information to code with codable children: Secondary | ICD-10-CM

## 2013-06-06 DIAGNOSIS — F411 Generalized anxiety disorder: Secondary | ICD-10-CM | POA: Insufficient documentation

## 2013-06-06 DIAGNOSIS — E785 Hyperlipidemia, unspecified: Secondary | ICD-10-CM | POA: Insufficient documentation

## 2013-06-06 DIAGNOSIS — F17200 Nicotine dependence, unspecified, uncomplicated: Secondary | ICD-10-CM | POA: Insufficient documentation

## 2013-06-06 DIAGNOSIS — E119 Type 2 diabetes mellitus without complications: Secondary | ICD-10-CM | POA: Insufficient documentation

## 2013-06-06 LAB — CBC WITH DIFFERENTIAL/PLATELET
Basophils Absolute: 0 10*3/uL (ref 0.0–0.1)
Eosinophils Relative: 2 % (ref 0–5)
Lymphocytes Relative: 56 % — ABNORMAL HIGH (ref 12–46)
Lymphs Abs: 5.6 10*3/uL — ABNORMAL HIGH (ref 0.7–4.0)
Neutro Abs: 3.8 10*3/uL (ref 1.7–7.7)
Neutrophils Relative %: 38 % — ABNORMAL LOW (ref 43–77)
Platelets: 214 10*3/uL (ref 150–400)
RBC: 4.09 MIL/uL (ref 3.87–5.11)
RDW: 15.7 % — ABNORMAL HIGH (ref 11.5–15.5)
WBC: 10 10*3/uL (ref 4.0–10.5)

## 2013-06-06 LAB — BASIC METABOLIC PANEL
Chloride: 104 mEq/L (ref 96–112)
Creatinine, Ser: 0.59 mg/dL (ref 0.50–1.10)
GFR calc Af Amer: >90 mL/min (ref >90)
Sodium: 140 mEq/L (ref 135–145)

## 2013-06-06 LAB — URINALYSIS, ROUTINE W REFLEX MICROSCOPIC
Glucose, UA: NEGATIVE mg/dL
Ketones, ur: NEGATIVE mg/dL
Nitrite: NEGATIVE
Specific Gravity, Urine: 1.02 (ref 1.005–1.030)
pH: 7.5 (ref 5.0–8.0)

## 2013-06-06 LAB — URINE MICROSCOPIC-ADD ON

## 2013-06-06 NOTE — ED Notes (Signed)
CSW spoke with pt who is agreeable to stay in the ED this pm and then go to [facility] in the am.  Facility unable to accept pt this pm because they were told she was not coming.  CSW will f/u with NH in the am.

## 2013-06-06 NOTE — Telephone Encounter (Signed)
Patient called stating that she is very sick and has not had any food or medication. She was admitted to [facility], but refused to go. They went out to pick her up, but she wouldn't go. She is asking for home health to come out and administer her medication. We were in the process of ordering home health, but the patient called and is declining quickly. I called 911 to have EMS sent out to her home. They are in route now.

## 2013-06-06 NOTE — ED Provider Notes (Signed)
CSN: [id number]     Arrival date & time 06/06/13  1636 History     First MD Initiated Contact with Patient 06/06/13 1659     Chief Complaint  Patient presents with  . Weakness   (Consider location/radiation/quality/duration/timing/severity/associated sxs/prior Treatment) Patient is a 72 y.o. female presenting with weakness.  Weakness   Pt with numerous medical problems recently had surgery for patellar fracture, had a brief rehab stay at [facility] and went back home about a month ago. She reports since that time, she has not been getting her medications regularly because home health is no longer putting the meds in her pill organizer. She states she has not been getting much to eat or drink because her home health aids are not cooking for her. She does not have reliable family support. She has been seen at PCP office twice since returning home, most recently was 4 days ago when her PCP noted that she was no longer safe at home and arranged admission to [facility] facility however when transport called to come pick her up this afternoon, she refused to go saying she 'felt too bad'. She is unable to describe any specific symptoms, just doesn't feel good. Denies pain, except post-op pain in L knee, no N/V/D. No fever, dysuria or hematuria.   Past Medical History  Diagnosis Date  . Diabetes mellitus without complication   . Anxiety   . GERD (gastroesophageal reflux disease)   . Vitamin B12 deficiency   . Hyperlipidemia   . Tobacco use disorder   . Posttraumatic stress disorder   . Hypertension   . Allergy   . Allergic rhinitis due to pollen   . Osteoporosis   . Other malaise and fatigue   . Abnormality of gait   . Hepatomegaly   . History of fall   . Muscle weakness (generalized)   . Diarrhea   . Unspecified constipation   . Abdominal pain, generalized   . Kyphosis (acquired) (postural)   . Posttraumatic stress disorder   . Other psoriasis   . Abnormality of gait   . Other  B-complex deficiencies   . Dementia in conditions classified elsewhere without behavioral disturbance(294.10)   . Pain   . Other malaise and fatigue   . Personal history of fall   . Hypercalcemia   . Senile osteoporosis   . Cervicitis and endocervicitis   . Unspecified pruritic disorder   . Closed dislocation of shoulder, unspecified site   . Leukocytosis, unspecified   . Other specified pre-operative examination   . Abdominal pain, generalized   . Tinnitus   . Pain in joint, shoulder region   . Pain in joint, lower leg   . Impacted cerumen   . Diarrhea   . Hypopotassemia   . Diverticulosis of colon (without mention of hemorrhage)   . Tobacco use disorder   . Reflux esophagitis   . Cough   . Abnormality of gait   . Acute bronchitis   . 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 Hepatomegaly   . Dermatitis   . Senile dementia, uncomplicated   . Acute sinusitis, unspecified   . Type II or unspecified type diabetes mellitus without mention of complication, uncontrolled   . Contact with or exposure to other communicable diseases(V01.89)   . Candidiasis of vulva and vagina   . Type II or unspecified type diabetes mellitus without mention of complication, not stated as uncontrolled   .  Other and unspecified hyperlipidemia   . Anxiety state, unspecified   . Unspecified essential hypertension   . Allergic rhinitis due to pollen   . Pain in joint, site unspecified   . Stiffness of joint, not elsewhere classified, unspecified site   . Abdominal pain, unspecified site   . Vascular dementia   . Shoulder dislocation   . Psoriasis   . Constipation, chronic   . Senile osteoporosis   . Osteoarthritis of both knees   . Hearing loss sensory, bilateral   . Tobacco abuse   . Chronic pain of both shoulders    Past Surgical History  Procedure Laterality Date  . Bladder surgery    . Spine surgery    . Orif patella Left  04/08/2013    Procedure: OPEN REDUCTION INTERNAL (ORIF) FIXATION PATELLA;  Surgeon: Eldred Manges, MD;  Location: [facility] OR;  Service: Orthopedics;  Laterality: Left;  Open Reduction Internal Fixation Left Patella Fracture   No family history on file. History  Substance Use Topics  . Smoking status: Current Every Day Smoker -- 1.00 packs/day for 40 years    Types: Cigarettes  . Smokeless tobacco: Former Neurosurgeon    Quit date: 05/03/2013  . Alcohol Use: No   OB History   Grav Para Term Preterm Abortions TAB SAB Ect Mult Living                 Review of Systems  Neurological: Positive for weakness.   All other systems reviewed and are negative except as noted in HPI.   Allergies  Buspar; Percocet; and Vicodin  Home Medications   Current Outpatient Rx  Name  Route  Sig  Dispense  Refill  . ACCU-CHEK FASTCLIX LANCETS MISC   Does not apply   1 Container by Does not apply route once. Use once daily to check blood sugars  Dx 250.02   102 each   3   . albuterol (PROVENTIL HFA;VENTOLIN HFA) 108 (90 BASE) MCG/ACT inhaler   Inhalation   Inhale 2 puffs into the lungs every 6 (six) hours as needed for wheezing.         Marland Kitchen atenolol (TENORMIN) 25 MG tablet   Oral   Take 25 mg by mouth daily.         Marland Kitchen atorvastatin (LIPITOR) 10 MG tablet   Oral   Take 10 mg by mouth daily.         . Calcipotriene 0.005 % solution   Topical   Apply 1 application topically daily as needed (dry scalp).         . cholestyramine (QUESTRAN) 4 G packet   Oral   Take 1 packet by mouth daily.          Marland Kitchen diltiazem (DILT-XR) 180 MG 24 hr capsule   Oral   Take 180 mg by mouth daily.          Marland Kitchen donepezil (ARICEPT) 10 MG tablet   Oral   Take 5 mg by mouth daily.         . DULoxetine (CYMBALTA) 30 MG capsule   Oral   Take 1 capsule (30 mg total) by mouth daily.      3   . EASY TOUCH PEN NEEDLES 31G X 8 MM MISC      USE AS DIRECTED ONCE DAILY   100 each   11   . esomeprazole (NEXIUM) 40  MG capsule   Oral   Take 40 mg by mouth daily.         Marland Kitchen  furosemide (LASIX) 20 MG tablet   Oral   Take 20 mg by mouth daily.         Marland Kitchen gabapentin (NEURONTIN) 300 MG capsule   Oral   Take 300 mg by mouth daily. For nerve pain         . glucose blood test strip      Use as instructed to check blood sugar. 250.02   100 each   12   . glycopyrrolate (ROBINUL) 1 MG tablet      Take one tablet once daily as needed for stomach         . hydrochlorothiazide (HYDRODIURIL) 25 MG tablet   Oral   Take 25 mg by mouth daily.         . hydrocortisone 2.5 % lotion   Topical   Apply 1 application topically 2 (two) times daily as needed (itchy rash).         Marland Kitchen ipratropium (ATROVENT HFA) 17 MCG/ACT inhaler   Inhalation   Inhale 2 puffs into the lungs 4 (four) times daily as needed for wheezing (cough).         . Memantine HCl ER (NAMENDA XR) 28 MG CP24   Oral   Take 28 mg by mouth daily.         . metFORMIN (GLUCOPHAGE) 500 MG tablet   Oral   Take 500 mg by mouth 2 (two) times daily with a meal. Take 1 tablet once daily for diabetes         . nicotine (NICODERM CQ - DOSED IN MG/24 HR) 7 mg/24hr patch   Transdermal   Place 1 patch onto the skin daily.         Marland Kitchen PARoxetine (PAXIL) 40 MG tablet   Oral   Take 40 mg by mouth daily.         . potassium chloride SA (K-DUR,KLOR-CON) 20 MEQ tablet   Oral   Take 20 mEq by mouth 2 (two) times daily.         . ramipril (ALTACE) 10 MG tablet   Oral   Take 10 mg by mouth daily.          . sucralfate (CARAFATE) 1 G tablet   Oral   Take 1 g by mouth 4 (four) times daily. 1 tablet before meals and at bedtime to protect stomach         . traZODone (DESYREL) 150 MG tablet   Oral   Take 150 mg by mouth at bedtime.          BP 150/83  Pulse 81  Temp(Src) 98.2 F (36.8 C) (Oral)  Resp 24  SpO2 99% Physical Exam  Nursing note and vitals reviewed. Constitutional: She is oriented to person, place, and time.  She appears well-developed and well-nourished.  HENT:  Head: Normocephalic and atraumatic.  Eyes: EOM are normal. Pupils are equal, round, and reactive to light.  Neck: Normal range of motion. Neck supple.  Cardiovascular: Normal rate, normal heart sounds and intact distal pulses.   Pulmonary/Chest: Effort normal and breath sounds normal.  Abdominal: Bowel sounds are normal. She exhibits no distension. There is no tenderness.  Musculoskeletal: Normal range of motion. She exhibits no edema and no tenderness.  Neurological: She is alert and oriented to person, place, and time. She has normal strength. No cranial nerve deficit or sensory deficit.  Skin: Skin is warm and dry. No rash noted.  Psychiatric: She has a normal mood and affect.  ED Course   Procedures (including critical care time)  Labs Reviewed  URINALYSIS, ROUTINE W REFLEX MICROSCOPIC - Abnormal; Notable for the following:    APPearance HAZY (*)    Leukocytes, UA SMALL (*)    All other components within normal limits  URINE MICROSCOPIC-ADD ON - Abnormal; Notable for the following:    Squamous Epithelial / LPF FEW (*)    Bacteria, UA FEW (*)    All other components within normal limits  CBC WITH DIFFERENTIAL  BASIC METABOLIC PANEL   No results found. No diagnosis found.  MDM  Spoke with Jody from Social Work. She has spoken to staff at [facility] and they will hold her bed until tomorrow when they will have admission staff who can accept her. Will check for acute medical issues which might require admission, but suspect that she will be held in the ED until tomorrow.   Charles B. Bernette Mayers, MD 06/07/13 1332

## 2013-06-06 NOTE — ED Notes (Signed)
PER EMS: patient from home, not taking medication x 1 month, reporting generalized weakness x 1 month and dark urine. Patient alert and oriented x 3, hypertensive.

## 2013-06-07 MED ORDER — ATENOLOL 25 MG PO TABS
25.0000 mg | ORAL_TABLET | Freq: Every day | ORAL | Status: DC
  Administered 2013-06-07: 25 mg via ORAL
  Filled 2013-06-07 (×2): qty 1

## 2013-06-07 MED ORDER — TRAZODONE HCL 50 MG PO TABS: 150.0000 mg | ORAL_TABLET | Freq: Every day | ORAL | Status: DC

## 2013-06-07 MED ORDER — MEMANTINE HCL 5 MG PO TABS
28.0000 mg | ORAL_TABLET | Freq: Every morning | ORAL | Status: DC
  Filled 2013-06-07: qty 6

## 2013-06-07 MED ORDER — DONEPEZIL HCL 10 MG PO TABS
10.0000 mg | ORAL_TABLET | Freq: Every day | ORAL | Status: DC
  Filled 2013-06-07: qty 1

## 2013-06-07 MED ORDER — HYDROCHLOROTHIAZIDE 25 MG PO TABS
25.0000 mg | ORAL_TABLET | Freq: Every day | ORAL | Status: DC
Start: 2013-06-07 — End: 2013-06-07
  Administered 2013-06-07: 25 mg via ORAL
  Filled 2013-06-07: qty 1

## 2013-06-07 MED ORDER — PANTOPRAZOLE SODIUM 40 MG PO TBEC
80.0000 mg | DELAYED_RELEASE_TABLET | Freq: Every day | ORAL | Status: DC
  Administered 2013-06-07: 80 mg via ORAL
  Filled 2013-06-07: qty 2

## 2013-06-07 MED ORDER — MEMANTINE HCL ER 28 MG PO CP24
28.0000 mg | ORAL_CAPSULE | Freq: Every day | ORAL | Status: DC
Start: 2013-06-07 — End: 2013-06-07
  Administered 2013-06-07: 28 mg via ORAL
  Filled 2013-06-07: qty 28

## 2013-06-07 MED ORDER — POTASSIUM CHLORIDE CRYS ER 20 MEQ PO TBCR
20.0000 meq | EXTENDED_RELEASE_TABLET | Freq: Two times a day (BID) | ORAL | Status: DC
  Administered 2013-06-07: 20 meq via ORAL
  Filled 2013-06-07: qty 1

## 2013-06-07 MED ORDER — DILTIAZEM HCL ER COATED BEADS 180 MG PO CP24
180.0000 mg | ORAL_CAPSULE | Freq: Every day | ORAL | Status: DC
  Administered 2013-06-07: 180 mg via ORAL
  Filled 2013-06-07: qty 1

## 2013-06-07 MED ORDER — GABAPENTIN 300 MG PO CAPS
300.0000 mg | ORAL_CAPSULE | Freq: Every morning | ORAL | Status: DC
  Administered 2013-06-07: 300 mg via ORAL
  Filled 2013-06-07: qty 1

## 2013-06-07 MED ORDER — CHOLESTYRAMINE LIGHT 4 G PO PACK
4.0000 g | PACK | Freq: Every day | ORAL | Status: DC
  Administered 2013-06-07: 4 g via ORAL
  Filled 2013-06-07: qty 1

## 2013-06-07 MED ORDER — METFORMIN HCL 500 MG PO TABS
500.0000 mg | ORAL_TABLET | Freq: Two times a day (BID) | ORAL | Status: DC
  Administered 2013-06-07: 500 mg via ORAL
  Filled 2013-06-07: qty 1

## 2013-06-07 MED ORDER — RAMIPRIL 10 MG PO CAPS
10.0000 mg | ORAL_CAPSULE | Freq: Every day | ORAL | Status: DC
  Administered 2013-06-07: 10 mg via ORAL
  Filled 2013-06-07: qty 1

## 2013-06-07 MED ORDER — FUROSEMIDE 20 MG PO TABS
20.0000 mg | ORAL_TABLET | Freq: Every day | ORAL | Status: DC
  Administered 2013-06-07: 20 mg via ORAL
  Filled 2013-06-07: qty 1

## 2013-06-07 MED ORDER — SUCRALFATE 1 G PO TABS
1.0000 g | ORAL_TABLET | Freq: Three times a day (TID) | ORAL | Status: DC
  Administered 2013-06-07: 1 g via ORAL
  Filled 2013-06-07 (×4): qty 1

## 2013-06-07 MED ORDER — ATORVASTATIN CALCIUM 10 MG PO TABS
10.0000 mg | ORAL_TABLET | Freq: Every day | ORAL | Status: DC
  Filled 2013-06-07: qty 1

## 2013-06-07 MED ORDER — PAROXETINE HCL 20 MG PO TABS
40.0000 mg | ORAL_TABLET | Freq: Every day | ORAL | Status: DC
  Administered 2013-06-07: 40 mg via ORAL
  Filled 2013-06-07: qty 2

## 2013-06-07 MED ORDER — ALBUTEROL SULFATE HFA 108 (90 BASE) MCG/ACT IN AERS: 2.0000 | INHALATION_SPRAY | Freq: Four times a day (QID) | RESPIRATORY_TRACT | Status: DC | PRN

## 2013-06-07 MED ORDER — DULOXETINE HCL 30 MG PO CPEP
30.0000 mg | ORAL_CAPSULE | Freq: Every day | ORAL | Status: DC
  Administered 2013-06-07: 30 mg via ORAL
  Filled 2013-06-07: qty 1

## 2013-06-07 NOTE — ED Notes (Signed)
Spoke with case Production designer, theatre/television/film. She is trying to contact cassandra in social work and inform her family is here and would like to have someone speak with them . They state they have no information on pt placement

## 2013-06-07 NOTE — ED Notes (Signed)
0600  Pt was able to sleep through the entire night without waking.  Pt status has not changed and pt is still calm and still cooperative.  No new needs at this time for the pt.  Will continue to monitor the pt.  0605  Pt is able to ambulate to the restroom with her walker and with a steady gait.
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
## 2013-06-07 NOTE — ED Notes (Signed)
CALLED PHARMACY AGAIN DUE TO MED DELAY. THEY ARE AWARE. PER FRANK HE WILL CHANGE TIME OF MED AND IS WORKING WITH [employer] TO GENERATE A LABEL FOR PT MED SO IT CAN BE SCANNED.

## 2013-06-07 NOTE — ED Notes (Signed)
Other 10 am meds delayed due to pharmacy not sending. Pharmacy has been called and emailed

## 2013-06-07 NOTE — ED Notes (Signed)
PT DISCHARGED TO [facility]. GRANDAUGHTER TRANSPORTING PATIENT

## 2013-06-07 NOTE — Progress Notes (Signed)
CSW met with daughter and Pt at the bedside. Pt is agreeable to transport to facility today per Pt.   CSW confirmed with daughter and facility [facility]). Pt will be be transported by family and will be leaving around 1:30-2pm pending medical readiness for d/c.   CSW faxed ED noted to facility.   No furhter CSW needs at this time.   Leron Croak, LCSWA [facility] Emergency Dept.  [phone]

## 2013-06-07 NOTE — ED Notes (Signed)
SOCIAL WORKER IS HERE TALKING WITH PATIENT AND HER FAMILY

## 2013-06-07 NOTE — Progress Notes (Signed)
CSW spoke with Bonita [facility] at [facility] and Pt is still able to come to facility if Pt is agreeable.   CSW will meet with the Pt to discuss d/c.   Leron Croak, LCSWA [facility] Emergency Dept.  [phone]

## 2013-06-09 ENCOUNTER — Non-Acute Institutional Stay (SKILLED_NURSING_FACILITY): Payer: Medicare Other | Admitting: Internal Medicine

## 2013-06-09 DIAGNOSIS — S8290XS Unspecified fracture of unspecified lower leg, sequela: Secondary | ICD-10-CM

## 2013-06-09 DIAGNOSIS — E119 Type 2 diabetes mellitus without complications: Secondary | ICD-10-CM

## 2013-06-09 DIAGNOSIS — R531 Weakness: Secondary | ICD-10-CM

## 2013-06-09 DIAGNOSIS — R5381 Other malaise: Secondary | ICD-10-CM

## 2013-06-09 DIAGNOSIS — F22 Delusional disorders: Secondary | ICD-10-CM

## 2013-06-09 DIAGNOSIS — S82002S Unspecified fracture of left patella, sequela: Secondary | ICD-10-CM

## 2013-06-09 DIAGNOSIS — F0390 Unspecified dementia without behavioral disturbance: Secondary | ICD-10-CM

## 2013-06-09 DIAGNOSIS — I10 Essential (primary) hypertension: Secondary | ICD-10-CM
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
## 2013-06-16 ENCOUNTER — Non-Acute Institutional Stay (SKILLED_NURSING_FACILITY): Payer: Medicare Other | Admitting: Internal Medicine

## 2013-06-16 DIAGNOSIS — E119 Type 2 diabetes mellitus without complications: Secondary | ICD-10-CM

## 2013-06-16 DIAGNOSIS — F0392 Unspecified dementia, unspecified severity, with psychotic disturbance: Secondary | ICD-10-CM

## 2013-06-16 DIAGNOSIS — S82002S Unspecified fracture of left patella, sequela: Secondary | ICD-10-CM

## 2013-06-16 DIAGNOSIS — I10 Essential (primary) hypertension: Secondary | ICD-10-CM

## 2013-06-16 DIAGNOSIS — K219 Gastro-esophageal reflux disease without esophagitis: Secondary | ICD-10-CM

## 2013-06-16 DIAGNOSIS — F0390 Unspecified dementia without behavioral disturbance: Secondary | ICD-10-CM

## 2013-06-16 DIAGNOSIS — R5381 Other malaise: Secondary | ICD-10-CM

## 2013-06-16 DIAGNOSIS — R531 Weakness: Secondary | ICD-10-CM

## 2013-06-16 DIAGNOSIS — S8290XS Unspecified fracture of unspecified lower leg, sequela: Secondary | ICD-10-CM

## 2013-06-16 NOTE — Progress Notes (Signed)
Patient ID: Amanda Adkins, female   DOB: 14-Jul-1941, 73 y.o.   MRN: [id number]  Chief Complaint  Patient presents with  . Discharge Note   Amanda Adkins living [place]  HPI:   72 y.o. female patient is here for rehab. She has worked well with therapy team and is stable to be discharged home with home services PT/OT. ALF was contacted for possible transfer of care but declined given her functionality. She denies any complaints  Review of Systems:  Constitutional: Negative for fever and chills. Negative for weight loss HENT: Negative for congestion.    Eyes: Negative for blurred vision.   Respiratory: Negative for shortness of breath.         Continues to smoke   Cardiovascular: Negative for chest pain.   Gastrointestinal: Negative for nausea, vomiting, abdominal pain, blood in stool and melena.   Genitourinary: Negative for dysuria.   Musculoskeletal: Negative for falls.   Neurological: Negative for dizziness, loss of consciousness and headaches.   Endo/Heme/Allergies: Does not bruise/bleed easily.   Psychiatric/Behavioral: Positive for depression      Physical exam  BP 122/71  Pulse 88  Temp(Src) 98.2 F (36.8 C)  Resp 18  Ht 5\' 2"  (1.575 m)  Wt 182 lb (82.555 kg)  BMI 33.28 kg/m2  SpO2 96%  Constitutional: No distress.  Chronically ill female   HENT:   Head: Normocephalic and atraumatic.   Cardiovascular: Normal rate, regular rhythm and normal heart sounds.    Pulmonary/Chest: Effort normal and breath sounds normal.   Abdominal: Soft. Bowel sounds are normal.  Abdominal obesity  Neurological: She is alert. No cranial nerve deficit.  Oriented to person and place   Skin: Skin is warm and dry.  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   Generalized weakness Encourage po intake, fall precautions, gait training  will arrange for home  PT/OT. Has made improvement here. Has CAPs working with her  Dm Continue metformin   Copd Breathing currently stable. Continue ipratropium with proventil for now and monitor her sympotms  Left patella fracture Walks with a walker, to work with PT/OT. Fall precautions. Continue gabapentin for now. To provide collapsable walker  HTN bp remains stable. Continue her atenolol, ramipril, furosemide for now  gerd Continue nexium for now  Scripts for her medications provided  Family/ staff Communication: reviewed care plan with patient, her grand daughter and nursing supervisor

## 2013-06-20 ENCOUNTER — Other Ambulatory Visit: Payer: Self-pay | Admitting: Geriatric Medicine

## 2013-06-20 DIAGNOSIS — S82009A Unspecified fracture of unspecified patella, initial encounter for closed fracture: Secondary | ICD-10-CM

## 2013-06-20 DIAGNOSIS — F0150 Vascular dementia without behavioral disturbance: Secondary | ICD-10-CM

## 2013-06-20 DIAGNOSIS — E119 Type 2 diabetes mellitus without complications: Secondary | ICD-10-CM

## 2013-06-20 DIAGNOSIS — E785 Hyperlipidemia, unspecified: Secondary | ICD-10-CM

## 2013-06-20 NOTE — Progress Notes (Signed)
Patient ID: Amanda Adkins, female   DOB: [birth year], 72 y.o.   MRN: [id number]  Will need home health services, PT and OT

## 2013-06-29 ENCOUNTER — Other Ambulatory Visit: Payer: Self-pay | Admitting: Internal Medicine

## 2013-06-30 ENCOUNTER — Ambulatory Visit: Payer: Medicare Other | Admitting: Internal Medicine

## 2013-07-04 ENCOUNTER — Ambulatory Visit (INDEPENDENT_AMBULATORY_CARE_PROVIDER_SITE_OTHER): Payer: Medicare Other | Admitting: Internal Medicine

## 2013-07-04 ENCOUNTER — Encounter: Payer: Self-pay | Admitting: Internal Medicine

## 2013-07-04 VITALS — BP 122/70 | HR 70 | Temp 97.4°F | Resp 18 | Ht 62.0 in | Wt 173.0 lb

## 2013-07-04 DIAGNOSIS — Z720 Tobacco use: Secondary | ICD-10-CM

## 2013-07-04 DIAGNOSIS — F22 Delusional disorders: Secondary | ICD-10-CM

## 2013-07-04 DIAGNOSIS — G8929 Other chronic pain: Secondary | ICD-10-CM

## 2013-07-04 DIAGNOSIS — F17200 Nicotine dependence, unspecified, uncomplicated: Secondary | ICD-10-CM

## 2013-07-04 DIAGNOSIS — M25519 Pain in unspecified shoulder: Secondary | ICD-10-CM

## 2013-07-04 DIAGNOSIS — F0392 Unspecified dementia, unspecified severity, with psychotic disturbance: Secondary | ICD-10-CM

## 2013-07-04 DIAGNOSIS — F0390 Unspecified dementia without behavioral disturbance: Secondary | ICD-10-CM

## 2013-07-04 DIAGNOSIS — E1149 Type 2 diabetes mellitus with other diabetic neurological complication: Secondary | ICD-10-CM

## 2013-07-04 MED ORDER — RISPERIDONE 0.25 MG PO TABS: 0.2500 mg | ORAL_TABLET | Freq: Every day | ORAL | Status: DC
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
## 2013-07-04 NOTE — Patient Instructions (Addendum)
Please call with any questions about Amanda Adkins's medicines.   Please consider moving to assisted living.   Start risperidone for your anxiety.

## 2013-07-13 ENCOUNTER — Telehealth: Payer: Self-pay

## 2013-07-13 MED ORDER — ESOMEPRAZOLE MAGNESIUM 40 MG PO CPDR: 40.0000 mg | DELAYED_RELEASE_CAPSULE | Freq: Every day | ORAL | Status: DC

## 2013-07-13 NOTE — Telephone Encounter (Signed)
Patient called in on the triage line requesting a refill for nexium and pain medication. Patient states she recently seen Dr.Reed and does not understand why she was not given pain medications at that time.    Last OV 07/04/13, rx for nexium sent. Dr.Reed please advise on patient['s request for pain medications

## 2013-07-13 NOTE — Telephone Encounter (Signed)
Amanda Adkins called to request the status update on DM testing supply form. Form was originally faxed over on 06/07/13, please follow-up with status update. Ref # [id number]  Form was located, completed as much as possible. Placed on ledge for further completion and signature from physcian. From then to be faxed back.   Message left on VM for DM testing supply company with status update

## 2013-07-14 NOTE — Telephone Encounter (Signed)
Pt is not competent to make decisions.  She does have pain.  I recommend she use voltaren gel 2 grams four times daily on her shoulders where her pain is.  She is much clearer mentally w/o hydrocodone or tramadol.  She also has her cymbalta and gabapentin for pain.

## 2013-07-14 NOTE — Telephone Encounter (Signed)
Attempted to call patient x 2, call would not go through. I will try to reach patient again later

## 2013-07-15 MED ORDER — DICLOFENAC SODIUM 1 % TD GEL: TRANSDERMAL | Status: DC

## 2013-07-15 NOTE — Telephone Encounter (Signed)
Called patient, phone rung continually. Unable to leave message

## 2013-07-15 NOTE — Telephone Encounter (Signed)
I called patient again with no success, phone rung continually. Rx for Voltaren Gel sent into pharmacy on file.  I called and left message on voicemail for [name] (listed as friend/emergency contact), I indicated that this is a NON-URGENT call, I am calling after several attempts to reach patient with no success. Requested that patient call back when available.

## 2013-07-19 NOTE — Telephone Encounter (Signed)
I called the pharmacy, rx for Voltaren will be sent to the patient on Thursday. Patient never returned call, encounter will be closed.

## 2013-07-21 ENCOUNTER — Other Ambulatory Visit: Payer: Self-pay | Admitting: Internal Medicine

## 2013-08-11 ENCOUNTER — Ambulatory Visit (INDEPENDENT_AMBULATORY_CARE_PROVIDER_SITE_OTHER): Payer: Medicare Other | Admitting: Internal Medicine

## 2013-08-11 ENCOUNTER — Encounter: Payer: Self-pay | Admitting: Internal Medicine

## 2013-08-11 VITALS — BP 130/78 | HR 70 | Temp 96.6°F | Wt 179.0 lb

## 2013-08-11 DIAGNOSIS — E1149 Type 2 diabetes mellitus with other diabetic neurological complication: Secondary | ICD-10-CM

## 2013-08-11 DIAGNOSIS — K921 Melena: Secondary | ICD-10-CM

## 2013-08-11 DIAGNOSIS — F0150 Vascular dementia without behavioral disturbance: Secondary | ICD-10-CM

## 2013-08-11 DIAGNOSIS — Z23 Encounter for immunization: Secondary | ICD-10-CM

## 2013-08-11 DIAGNOSIS — F22 Delusional disorders: Secondary | ICD-10-CM

## 2013-08-11 DIAGNOSIS — S82002S Unspecified fracture of left patella, sequela: Secondary | ICD-10-CM

## 2013-08-11 DIAGNOSIS — G8929 Other chronic pain: Secondary | ICD-10-CM

## 2013-08-11 DIAGNOSIS — E785 Hyperlipidemia, unspecified: Secondary | ICD-10-CM

## 2013-08-11 DIAGNOSIS — M25519 Pain in unspecified shoulder: Secondary | ICD-10-CM

## 2013-08-11 DIAGNOSIS — S8290XS Unspecified fracture of unspecified lower leg, sequela: Secondary | ICD-10-CM

## 2013-08-11 MED ORDER — DICLOFENAC SODIUM 1 % TD GEL: TRANSDERMAL | Status: DC

## 2013-08-11 NOTE — Patient Instructions (Addendum)
Change your phone number out front.  Do stool cards to check for blood.  Let me know if you are still having diarrhea.
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
## 2013-08-12 LAB — CBC WITH DIFFERENTIAL/PLATELET
Basophils Absolute: 0.1 10*3/uL (ref 0.0–0.2)
Basos: 0 %
Eos: 3 %
Eosinophils Absolute: 0.3 10*3/uL (ref 0.0–0.4)
HCT: 39.8 % (ref 34.0–46.6)
Hemoglobin: 13.3 g/dL (ref 11.1–15.9)
Immature Grans (Abs): 0 10*3/uL (ref 0.0–0.1)
Immature Granulocytes: 0 %
Lymphocytes Absolute: 6.1 10*3/uL — ABNORMAL HIGH (ref 0.7–3.1)
Lymphs: 55 %
MCH: 28.5 pg (ref 26.6–33.0)
MCHC: 33.4 g/dL (ref 31.5–35.7)
MCV: 85 fL (ref 79–97)
Monocytes Absolute: 0.5 10*3/uL (ref 0.1–0.9)
Monocytes: 4 %
Neutrophils Absolute: 4.3 10*3/uL (ref 1.4–7.0)
Neutrophils Relative %: 38 %
RBC: 4.66 x10E6/uL (ref 3.77–5.28)
RDW: 16.3 % — ABNORMAL HIGH (ref 12.3–15.4)
WBC: 11.2 10*3/uL — ABNORMAL HIGH (ref 3.4–10.8)

## 2013-08-12 LAB — BASIC METABOLIC PANEL
BUN/Creatinine Ratio: 26 (ref 11–26)
BUN: 21 mg/dL (ref 8–27)
CO2: 26 mmol/L (ref 18–29)
Calcium: 10.6 mg/dL — ABNORMAL HIGH (ref 8.6–10.2)
Chloride: 97 mmol/L (ref 97–108)
Creatinine, Ser: 0.8 mg/dL (ref 0.57–1.00)
GFR calc Af Amer: 85 mL/min/{1.73_m2} (ref >59)
GFR calc non Af Amer: 73 mL/min/{1.73_m2} (ref >59)
Glucose: 114 mg/dL — ABNORMAL HIGH (ref 65–99)
Potassium: 4.4 mmol/L (ref 3.5–5.2)
Sodium: 140 mmol/L (ref 134–144)

## 2013-08-12 LAB — LIPID PANEL
Chol/HDL Ratio: 3.1 ratio units (ref 0.0–4.4)
Cholesterol, Total: 175 mg/dL (ref 100–199)
HDL: 56 mg/dL (ref >39)
LDL Calculated: 73 mg/dL (ref 0–99)
Triglycerides: 230 mg/dL — ABNORMAL HIGH (ref 0–149)
VLDL Cholesterol Cal: 46 mg/dL — ABNORMAL HIGH (ref 5–40)

## 2013-08-12 LAB — HEMOGLOBIN A1C
Est. average glucose Bld gHb Est-mCnc: 137 mg/dL
Hgb A1c MFr Bld: 6.4 % — ABNORMAL HIGH (ref 4.8–5.6)
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
## 2013-08-13 IMAGING — CR DG CHEST 2V
1 series · 1 of 1 positions shown · non-contrast
Comparison: 06/22/2012

CLINICAL DATA: Hip pain.  Fall.

CHEST - 2 VIEW

[t chest supine]
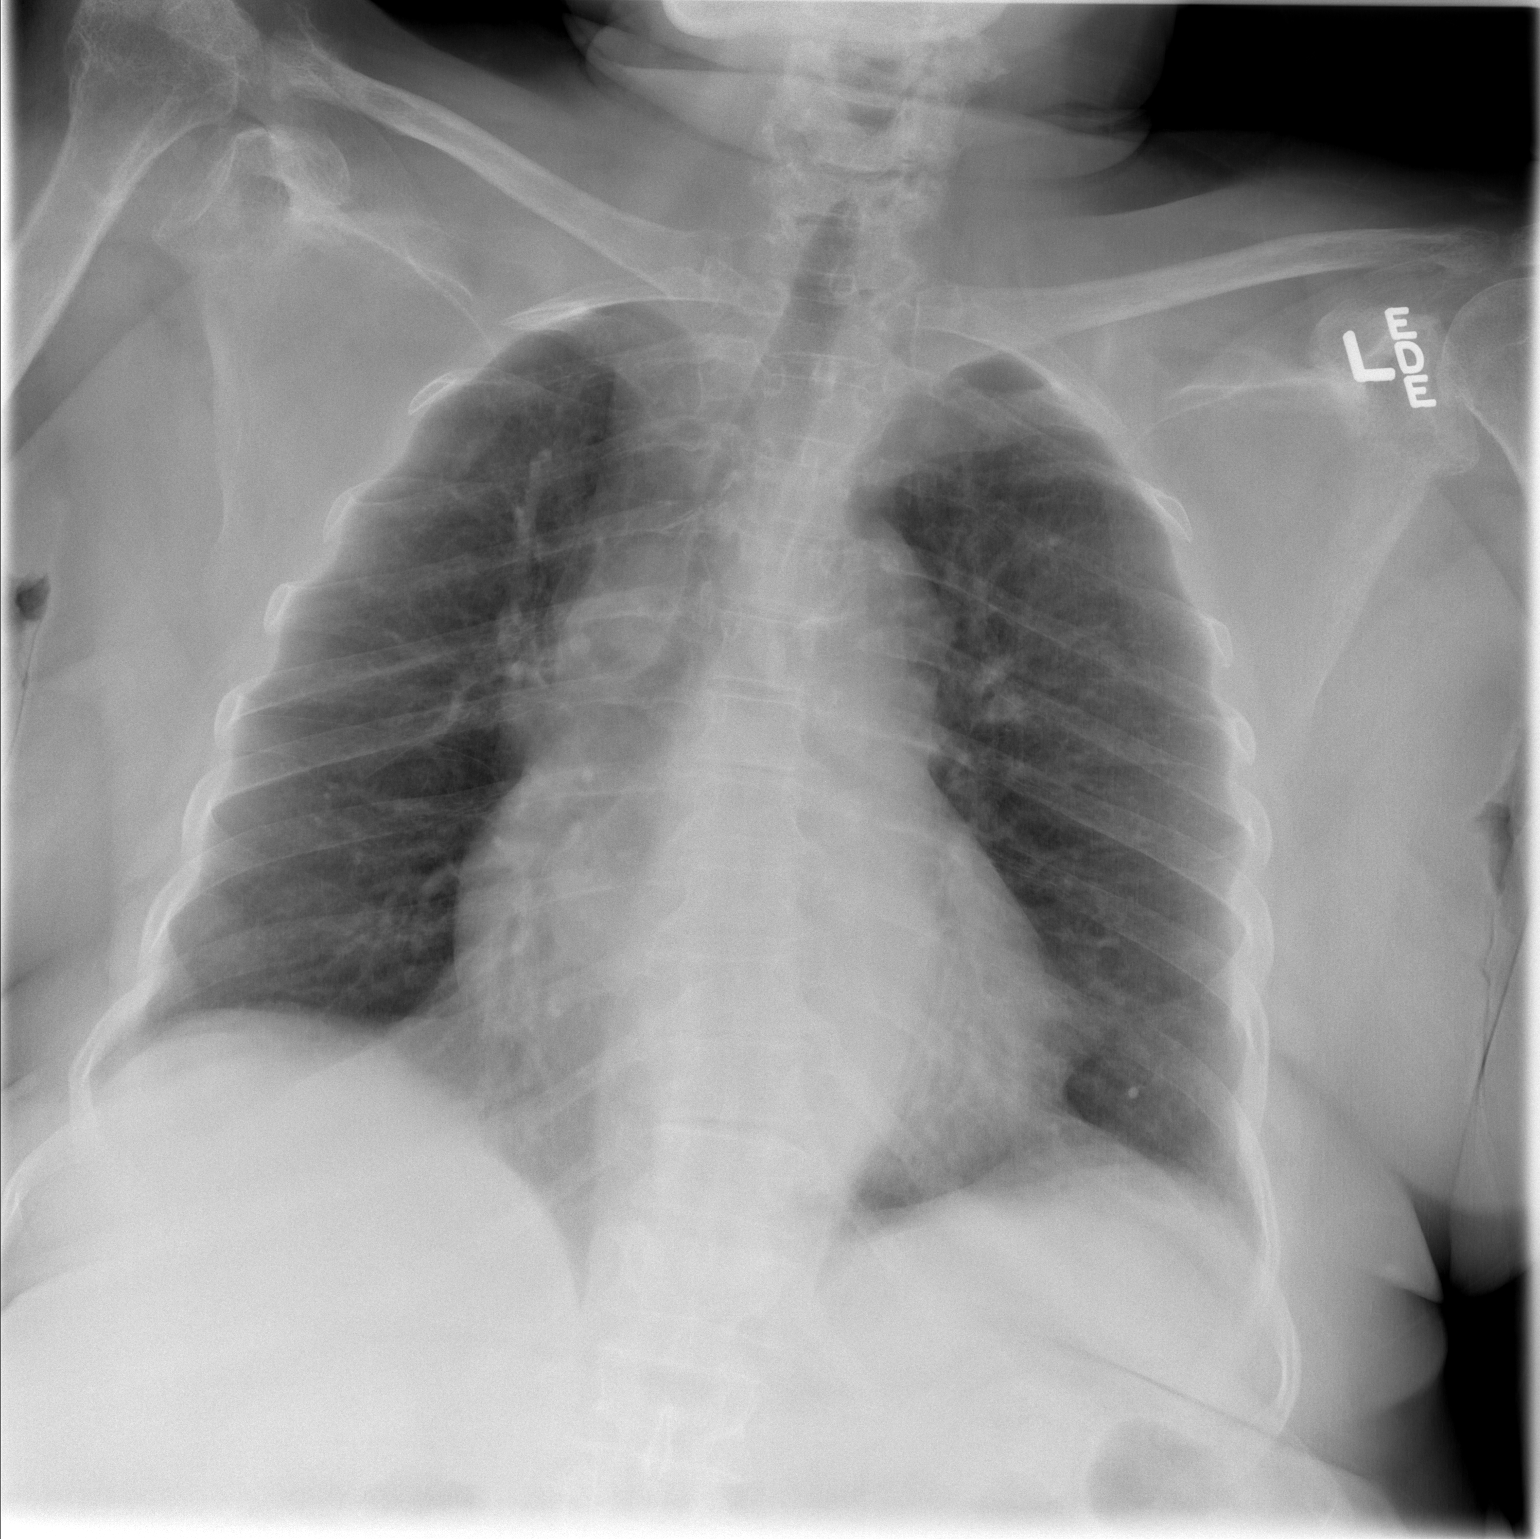

[1 of 1 positions shown; findings below may reference images not displayed]

FINDINGS: Heart is upper normal in size.  Lungs are under aerated
and grossly clear.  Mediastinum is stable in appearance with stable
fullness in the right paratracheal and aortic knob locations.  No
pneumothorax.  No definite pleural effusion.  No evidence of rib
fracture.  Chronic right rotator cuff injury is suspected.
IMPRESSION: No active cardiopulmonary disease.  Chronic changes are noted.

## 2013-08-17 ENCOUNTER — Other Ambulatory Visit: Payer: Self-pay | Admitting: Nurse Practitioner

## 2013-08-28 DIAGNOSIS — F0280 Dementia in other diseases classified elsewhere without behavioral disturbance: Secondary | ICD-10-CM

## 2013-08-28 DIAGNOSIS — E119 Type 2 diabetes mellitus without complications: Secondary | ICD-10-CM

## 2013-08-28 DIAGNOSIS — G309 Alzheimer's disease, unspecified: Secondary | ICD-10-CM

## 2013-08-28 DIAGNOSIS — F0281 Dementia in other diseases classified elsewhere with behavioral disturbance: Secondary | ICD-10-CM

## 2013-08-28 DIAGNOSIS — R32 Unspecified urinary incontinence: Secondary | ICD-10-CM

## 2013-08-28 DIAGNOSIS — F02818 Dementia in other diseases classified elsewhere, unspecified severity, with other behavioral disturbance: Secondary | ICD-10-CM

## 2013-08-31 ENCOUNTER — Other Ambulatory Visit: Payer: Medicare Other

## 2013-08-31 DIAGNOSIS — R300 Dysuria: Secondary | ICD-10-CM

## 2013-09-01 ENCOUNTER — Other Ambulatory Visit: Payer: Self-pay | Admitting: *Deleted

## 2013-09-01 DIAGNOSIS — R300 Dysuria: Secondary | ICD-10-CM

## 2013-09-02 LAB — URINALYSIS
Bilirubin, UA: NEGATIVE
Glucose, UA: NEGATIVE
Ketones, UA: NEGATIVE
Leukocytes, UA: NEGATIVE
Nitrite, UA: NEGATIVE
Protein, UA: NEGATIVE
RBC, UA: NEGATIVE
Specific Gravity, UA: 1.019 (ref 1.005–1.030)
Urobilinogen, Ur: 0.2 mg/dL (ref 0.0–1.9)
pH, UA: 6 (ref 5.0–7.5)

## 2013-09-02 LAB — URINE CULTURE

## 2013-09-14 ENCOUNTER — Other Ambulatory Visit: Payer: Self-pay | Admitting: Internal Medicine

## 2013-09-14 ENCOUNTER — Other Ambulatory Visit: Payer: Self-pay | Admitting: Nurse Practitioner

## 2013-09-21 ENCOUNTER — Ambulatory Visit (INDEPENDENT_AMBULATORY_CARE_PROVIDER_SITE_OTHER): Payer: Medicare Other | Admitting: Podiatrist

## 2013-09-21 ENCOUNTER — Encounter: Payer: Self-pay | Admitting: Podiatrist

## 2013-09-21 VITALS — BP 161/91 | HR 85 | Resp 16

## 2013-09-21 DIAGNOSIS — B351 Tinea unguium: Secondary | ICD-10-CM

## 2013-09-21 DIAGNOSIS — M79609 Pain in unspecified limb: Secondary | ICD-10-CM

## 2013-09-21 NOTE — Progress Notes (Signed)
  HPI: Patient presents today for follow up of diabetic foot and nail care. Past medical history, meds, and allergies reviewed.   Objective:  Neurovascular status unchanged with palpable pedal pulses and neurological sensation decreased bilateral. Patients nails are elongated, thickened, discolored, dystrophic with ingrown deformity present 2,3,4 bilateral.    Assessment: Diabetes with Neuropathy , symptomatic onychomycosis  Plan: Discussed treatment options and alternatives. Debrided nails without complication.  Return appointment recommended at routine intervals of 3 months.   Marlowe Aschoff, DPM

## 2013-09-21 NOTE — Patient Instructions (Signed)
Diabetes and Foot Care Diabetes may cause you to have problems because of poor blood supply (circulation) to your feet and legs. This may cause the skin on your feet to become thinner, break easier, and heal more slowly. Your skin may become dry, and the skin may peel and crack. You may also have nerve damage in your legs and feet causing decreased feeling in them. You may not notice minor injuries to your feet that could lead to infections or more serious problems. Taking care of your feet is one of the most important things you can do for yourself.  HOME CARE INSTRUCTIONS  Wear shoes at all times, even in the house. Do not go barefoot. Bare feet are easily injured.  Check your feet daily for blisters, cuts, and redness. If you cannot see the bottom of your feet, use a mirror or ask someone for help.  Wash your feet with warm water (do not use hot water) and mild soap. Then pat your feet and the areas between your toes until they are completely dry. Do not soak your feet as this can dry your skin.  Apply a moisturizing lotion or petroleum jelly (that does not contain alcohol and is unscented) to the skin on your feet and to dry, brittle toenails. Do not apply lotion between your toes.  Trim your toenails straight across. Do not dig under them or around the cuticle. File the edges of your nails with an emery board or nail file.  Do not cut corns or calluses or try to remove them with medicine.  Wear clean socks or stockings every day. Make sure they are not too tight. Do not wear knee-high stockings since they may decrease blood flow to your legs.  Wear shoes that fit properly and have enough cushioning. To break in new shoes, wear them for just a few hours a day. This prevents you from injuring your feet. Always look in your shoes before you put them on to be sure there are no objects inside.  Do not cross your legs. This may decrease the blood flow to your feet.  If you find a minor scrape,  cut, or break in the skin on your feet, keep it and the skin around it clean and dry. These areas may be cleansed with mild soap and water. Do not cleanse the area with peroxide, alcohol, or iodine.  When you remove an adhesive bandage, be sure not to damage the skin around it.  If you have a wound, look at it several times a day to make sure it is healing.  Do not use heating pads or hot water bottles. They may burn your skin. If you have lost feeling in your feet or legs, you may not know it is happening until it is too late.  Make sure your health care provider performs a complete foot exam at least annually or more often if you have foot problems. Report any cuts, sores, or bruises to your health care provider immediately. SEEK MEDICAL CARE IF:   You have an injury that is not healing.  You have cuts or breaks in the skin.  You have an ingrown nail.  You notice redness on your legs or feet.  You feel burning or tingling in your legs or feet.  You have pain or cramps in your legs and feet.  Your legs or feet are numb.  Your feet always feel cold. SEEK IMMEDIATE MEDICAL CARE IF:   There is increasing redness,   swelling, or pain in or around a wound.  There is a red line that goes up your leg.  Pus is coming from a wound.  You develop a fever or as directed by your health care provider.  You notice a bad smell coming from an ulcer or wound. Document Released: 10/24/2000 Document Revised: 06/29/2013 Document Reviewed: 04/05/2013 [employer] Patient Information 2014 [employer].  

## 2013-10-12 ENCOUNTER — Other Ambulatory Visit: Payer: Self-pay | Admitting: Internal Medicine

## 2013-10-12 ENCOUNTER — Other Ambulatory Visit: Payer: Self-pay | Admitting: Nurse Practitioner

## 2013-10-17 ENCOUNTER — Encounter: Payer: Self-pay | Admitting: Internal Medicine

## 2013-10-17 ENCOUNTER — Ambulatory Visit (INDEPENDENT_AMBULATORY_CARE_PROVIDER_SITE_OTHER): Payer: Medicare Other | Admitting: Internal Medicine

## 2013-10-17 DIAGNOSIS — F0150 Vascular dementia without behavioral disturbance: Secondary | ICD-10-CM

## 2013-10-17 DIAGNOSIS — E1149 Type 2 diabetes mellitus with other diabetic neurological complication: Secondary | ICD-10-CM

## 2013-10-17 DIAGNOSIS — M1710 Unilateral primary osteoarthritis, unspecified knee: Secondary | ICD-10-CM

## 2013-10-17 DIAGNOSIS — M2560 Stiffness of unspecified joint, not elsewhere classified: Secondary | ICD-10-CM

## 2013-10-17 DIAGNOSIS — K8689 Other specified diseases of pancreas: Secondary | ICD-10-CM

## 2013-10-17 DIAGNOSIS — F22 Delusional disorders: Secondary | ICD-10-CM

## 2013-10-17 DIAGNOSIS — G8929 Other chronic pain: Secondary | ICD-10-CM

## 2013-10-17 DIAGNOSIS — IMO0002 Reserved for concepts with insufficient information to code with codable children: Secondary | ICD-10-CM

## 2013-10-17 DIAGNOSIS — M170 Bilateral primary osteoarthritis of knee: Secondary | ICD-10-CM

## 2013-10-17 DIAGNOSIS — M25519 Pain in unspecified shoulder: Secondary | ICD-10-CM

## 2013-10-17 MED ORDER — PANCRELIPASE (LIP-PROT-AMYL) 12000-38000 UNITS PO CPEP: 1.0000 | ORAL_CAPSULE | Freq: Three times a day (TID) | ORAL | Status: DC
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
## 2013-10-27 DIAGNOSIS — E119 Type 2 diabetes mellitus without complications: Secondary | ICD-10-CM

## 2013-10-27 DIAGNOSIS — G309 Alzheimer's disease, unspecified: Secondary | ICD-10-CM

## 2013-10-27 DIAGNOSIS — F0280 Dementia in other diseases classified elsewhere without behavioral disturbance: Secondary | ICD-10-CM

## 2013-10-27 DIAGNOSIS — R32 Unspecified urinary incontinence: Secondary | ICD-10-CM

## 2013-10-27 DIAGNOSIS — F02818 Dementia in other diseases classified elsewhere, unspecified severity, with other behavioral disturbance: Secondary | ICD-10-CM

## 2013-10-27 DIAGNOSIS — F0281 Dementia in other diseases classified elsewhere with behavioral disturbance: Secondary | ICD-10-CM

## 2013-11-09 ENCOUNTER — Other Ambulatory Visit: Payer: Self-pay | Admitting: Nurse Practitioner

## 2013-11-09 ENCOUNTER — Other Ambulatory Visit: Payer: Self-pay | Admitting: Internal Medicine

## 2013-11-09 NOTE — Telephone Encounter (Signed)
Spoke with patient and she confirmed that she is not using Lantus and has not for a while. Patient states that her caregiver just requested everything without realizing she was no longer using Lantus

## 2013-11-17 ENCOUNTER — Other Ambulatory Visit: Payer: Medicare Other

## 2013-11-17 ENCOUNTER — Telehealth: Payer: Self-pay | Admitting: *Deleted

## 2013-11-17 DIAGNOSIS — K8689 Other specified diseases of pancreas: Secondary | ICD-10-CM

## 2013-11-17 DIAGNOSIS — F22 Delusional disorders: Secondary | ICD-10-CM

## 2013-11-17 DIAGNOSIS — E1149 Type 2 diabetes mellitus with other diabetic neurological complication: Secondary | ICD-10-CM

## 2013-11-17 NOTE — Telephone Encounter (Signed)
[facility] to [facility] To fill Formula 1117 cream (360 grams) for knee pain w/5 refills per D Reed.

## 2013-11-18 LAB — CBC WITH DIFFERENTIAL/PLATELET
Basophils Absolute: 0 10*3/uL (ref 0.0–0.2)
Basos: 0 %
Eos: 4 %
Eosinophils Absolute: 0.5 10*3/uL — ABNORMAL HIGH (ref 0.0–0.4)
HCT: 40.9 % (ref 34.0–46.6)
Hemoglobin: 13.3 g/dL (ref 11.1–15.9)
Immature Grans (Abs): 0 10*3/uL (ref 0.0–0.1)
Immature Granulocytes: 0 %
Lymphocytes Absolute: 6.4 10*3/uL — ABNORMAL HIGH (ref 0.7–3.1)
Lymphs: 54 %
MCH: 28 pg (ref 26.6–33.0)
MCHC: 32.5 g/dL (ref 31.5–35.7)
MCV: 86 fL (ref 79–97)
Monocytes Absolute: 0.5 10*3/uL (ref 0.1–0.9)
Monocytes: 4 %
Neutrophils Absolute: 4.5 10*3/uL (ref 1.4–7.0)
Neutrophils Relative %: 38 %
RBC: 4.75 x10E6/uL (ref 3.77–5.28)
RDW: 15.5 % — ABNORMAL HIGH (ref 12.3–15.4)
WBC: 11.9 10*3/uL — ABNORMAL HIGH (ref 3.4–10.8)

## 2013-11-18 LAB — LIPID PANEL
Chol/HDL Ratio: 3 ratio units (ref 0.0–4.4)
Cholesterol, Total: 156 mg/dL (ref 100–199)
HDL: 52 mg/dL (ref >39)
LDL Calculated: 65 mg/dL (ref 0–99)
Triglycerides: 197 mg/dL — ABNORMAL HIGH (ref 0–149)
VLDL Cholesterol Cal: 39 mg/dL (ref 5–40)

## 2013-11-18 LAB — HEMOGLOBIN A1C
Est. average glucose Bld gHb Est-mCnc: 134 mg/dL
Hgb A1c MFr Bld: 6.3 % — ABNORMAL HIGH (ref 4.8–5.6)

## 2013-11-18 LAB — COMPREHENSIVE METABOLIC PANEL
ALT: 8 IU/L (ref 0–32)
AST: 12 IU/L (ref 0–40)
Albumin/Globulin Ratio: 1.2 (ref 1.1–2.5)
Albumin: 4.1 g/dL (ref 3.5–4.8)
Alkaline Phosphatase: 130 IU/L — ABNORMAL HIGH (ref 39–117)
BUN/Creatinine Ratio: 26 (ref 11–26)
BUN: 20 mg/dL (ref 8–27)
CO2: 21 mmol/L (ref 18–29)
Calcium: 9.9 mg/dL (ref 8.6–10.2)
Chloride: 100 mmol/L (ref 97–108)
Creatinine, Ser: 0.77 mg/dL (ref 0.57–1.00)
GFR calc Af Amer: 89 mL/min/{1.73_m2} (ref >59)
GFR calc non Af Amer: 77 mL/min/{1.73_m2} (ref >59)
Globulin, Total: 3.5 g/dL (ref 1.5–4.5)
Glucose: 111 mg/dL — ABNORMAL HIGH (ref 65–99)
Potassium: 4.3 mmol/L (ref 3.5–5.2)
Sodium: 139 mmol/L (ref 134–144)
Total Bilirubin: <0.2 mg/dL (ref 0.0–1.2)
Total Protein: 7.6 g/dL (ref 6.0–8.5)

## 2013-11-22 ENCOUNTER — Telehealth: Payer: Self-pay

## 2013-11-22 NOTE — Telephone Encounter (Signed)
Spoke with patient, patient states she in no longer having any urinary concerns

## 2013-11-22 NOTE — Telephone Encounter (Signed)
Message copied by Maurice SmallBEATTY, Willadene Mounsey C on Tue Nov 22, 2013 10:24 AM ------      Message from: LyleREED, [place] L      Created: Fri Nov 18, 2013 10:00 AM       [facility].  Sugar average stable.  Triglycerides (starchy fats) remain high.  She has a slight wbc count.  Is she having more dysuria, urgency, frequency?  If so, she should be prepared to make a urine sample at her appt next week.  ------

## 2013-11-24 ENCOUNTER — Ambulatory Visit: Payer: Medicare Other | Admitting: Internal Medicine

## 2013-12-06 ENCOUNTER — Other Ambulatory Visit: Payer: Self-pay | Admitting: Internal Medicine

## 2013-12-26 DIAGNOSIS — G309 Alzheimer's disease, unspecified: Secondary | ICD-10-CM

## 2013-12-26 DIAGNOSIS — F0280 Dementia in other diseases classified elsewhere without behavioral disturbance: Secondary | ICD-10-CM

## 2013-12-26 DIAGNOSIS — R32 Unspecified urinary incontinence: Secondary | ICD-10-CM

## 2013-12-26 DIAGNOSIS — E119 Type 2 diabetes mellitus without complications: Secondary | ICD-10-CM

## 2013-12-26 DIAGNOSIS — F02818 Dementia in other diseases classified elsewhere, unspecified severity, with other behavioral disturbance: Secondary | ICD-10-CM

## 2013-12-26 DIAGNOSIS — F0281 Dementia in other diseases classified elsewhere with behavioral disturbance: Secondary | ICD-10-CM

## 2013-12-29 ENCOUNTER — Ambulatory Visit: Payer: Medicare Other | Admitting: Podiatrist

## 2013-12-30 ENCOUNTER — Other Ambulatory Visit: Payer: Self-pay | Admitting: Internal Medicine

## 2013-12-30 NOTE — Telephone Encounter (Signed)
Spoke with Jonny RuizJohn at pharmacy, informed him patient is not on this medication

## 2014-01-04 ENCOUNTER — Other Ambulatory Visit: Payer: Self-pay | Admitting: Internal Medicine

## 2014-01-04 ENCOUNTER — Other Ambulatory Visit: Payer: Self-pay | Admitting: Nurse Practitioner

## 2014-01-09 ENCOUNTER — Ambulatory Visit: Payer: Medicare Other | Admitting: Internal Medicine

## 2014-01-19 ENCOUNTER — Encounter: Payer: Self-pay | Admitting: Podiatrist

## 2014-01-19 ENCOUNTER — Ambulatory Visit (INDEPENDENT_AMBULATORY_CARE_PROVIDER_SITE_OTHER): Payer: Medicare Other | Admitting: Podiatrist

## 2014-01-19 VITALS — BP 136/76 | HR 68 | Resp 12

## 2014-01-19 DIAGNOSIS — M79609 Pain in unspecified limb: Secondary | ICD-10-CM

## 2014-01-19 DIAGNOSIS — B351 Tinea unguium: Secondary | ICD-10-CM

## 2014-01-23 NOTE — Progress Notes (Signed)
HPI:  Patient presents today for follow up of foot and nail care. Denies any new complaints today.  Objective:  Patients chart is reviewed.  Vascular status reveals pedal pulses noted at 2 out of 4 dp and pt bilateral .  Neurological sensation is Decreased to Semmes Weinstein monofilament bilateral.  Patients nails are thickened, discolored, distrophic, friable and brittle with yellow-brown discoloration. Patient subjectively relates they are painful with shoes and with ambulation of bilateral feet.  Assessment:  Symptomatic onychomycosis  Plan:  Discussed treatment options and alternatives.  The symptomatic toenails were debrided through manual an mechanical means without complication.  Return appointment recommended at routine intervals of 3 months    Nyiesha Beever, DPM   

## 2014-02-01 ENCOUNTER — Other Ambulatory Visit: Payer: Self-pay | Admitting: Internal Medicine

## 2014-02-01 ENCOUNTER — Other Ambulatory Visit: Payer: Self-pay | Admitting: *Deleted

## 2014-02-01 ENCOUNTER — Other Ambulatory Visit: Payer: Self-pay | Admitting: Nurse Practitioner

## 2014-02-01 MED ORDER — DIAZEPAM 5 MG PO TABS: ORAL_TABLET | ORAL | Status: DC

## 2014-02-01 NOTE — Telephone Encounter (Signed)
rx to faxed to [phone]

## 2014-02-02 ENCOUNTER — Ambulatory Visit (INDEPENDENT_AMBULATORY_CARE_PROVIDER_SITE_OTHER): Payer: Medicare Other | Admitting: Internal Medicine

## 2014-02-02 ENCOUNTER — Encounter: Payer: Self-pay | Admitting: Internal Medicine

## 2014-02-02 VITALS — BP 130/84 | HR 85 | Temp 98.1°F | Resp 10 | Wt 187.0 lb

## 2014-02-02 DIAGNOSIS — Z720 Tobacco use: Secondary | ICD-10-CM

## 2014-02-02 DIAGNOSIS — E1149 Type 2 diabetes mellitus with other diabetic neurological complication: Secondary | ICD-10-CM

## 2014-02-02 DIAGNOSIS — F17200 Nicotine dependence, unspecified, uncomplicated: Secondary | ICD-10-CM

## 2014-02-02 DIAGNOSIS — M170 Bilateral primary osteoarthritis of knee: Secondary | ICD-10-CM

## 2014-02-02 DIAGNOSIS — F22 Delusional disorders: Secondary | ICD-10-CM

## 2014-02-02 DIAGNOSIS — K8689 Other specified diseases of pancreas: Secondary | ICD-10-CM | POA: Insufficient documentation

## 2014-02-02 DIAGNOSIS — B372 Candidiasis of skin and nail: Secondary | ICD-10-CM | POA: Insufficient documentation

## 2014-02-02 DIAGNOSIS — M1710 Unilateral primary osteoarthritis, unspecified knee: Secondary | ICD-10-CM

## 2014-02-02 MED ORDER — NYSTATIN 100000 UNIT/GM EX CREA: 1.0000 "application " | TOPICAL_CREAM | Freq: Two times a day (BID) | CUTANEOUS | Status: DC

## 2014-02-02 MED ORDER — ACETAMINOPHEN 500 MG PO TABS: 1000.0000 mg | ORAL_TABLET | Freq: Three times a day (TID) | ORAL | Status: DC

## 2014-02-02 NOTE — Patient Instructions (Signed)
Use tylenol 1000mg  three times daily for pain.    I sent nystatin cream to your pharmacy also.
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
## 2014-02-03 LAB — CBC WITH DIFFERENTIAL
Basophils Absolute: 0 10*3/uL (ref 0.0–0.2)
Basos: 0 %
Eos: 3 %
Eosinophils Absolute: 0.3 10*3/uL (ref 0.0–0.4)
HCT: 41.5 % (ref 34.0–46.6)
Hemoglobin: 12.9 g/dL (ref 11.1–15.9)
Immature Grans (Abs): 0 10*3/uL (ref 0.0–0.1)
Immature Granulocytes: 0 %
Lymphocytes Absolute: 6.3 10*3/uL — ABNORMAL HIGH (ref 0.7–3.1)
Lymphs: 64 %
MCH: 27 pg (ref 26.6–33.0)
MCHC: 31.1 g/dL — ABNORMAL LOW (ref 31.5–35.7)
MCV: 87 fL (ref 79–97)
Monocytes Absolute: 0.5 10*3/uL (ref 0.1–0.9)
Monocytes: 5 %
Neutrophils Absolute: 2.8 10*3/uL (ref 1.4–7.0)
Neutrophils Relative %: 28 %
Platelets: 220 10*3/uL (ref 150–379)
RBC: 4.78 x10E6/uL (ref 3.77–5.28)
RDW: 15.4 % (ref 12.3–15.4)
WBC: 9.9 10*3/uL (ref 3.4–10.8)

## 2014-02-03 LAB — COMPREHENSIVE METABOLIC PANEL
ALT: 5 IU/L (ref 0–32)
AST: 11 IU/L (ref 0–40)
Albumin/Globulin Ratio: 1.1 (ref 1.1–2.5)
Albumin: 3.9 g/dL (ref 3.5–4.8)
Alkaline Phosphatase: 115 IU/L (ref 39–117)
BUN/Creatinine Ratio: 22 (ref 11–26)
BUN: 19 mg/dL (ref 8–27)
CO2: 22 mmol/L (ref 18–29)
Calcium: 10 mg/dL (ref 8.7–10.3)
Chloride: 101 mmol/L (ref 97–108)
Creatinine, Ser: 0.88 mg/dL (ref 0.57–1.00)
GFR calc Af Amer: 75 mL/min/{1.73_m2} (ref >59)
GFR calc non Af Amer: 65 mL/min/{1.73_m2} (ref >59)
Globulin, Total: 3.5 g/dL (ref 1.5–4.5)
Glucose: 68 mg/dL (ref 65–99)
Potassium: 4.5 mmol/L (ref 3.5–5.2)
Sodium: 141 mmol/L (ref 134–144)
Total Bilirubin: <0.2 mg/dL (ref 0.0–1.2)
Total Protein: 7.4 g/dL (ref 6.0–8.5)

## 2014-02-03 LAB — HEMOGLOBIN A1C
Est. average glucose Bld gHb Est-mCnc: 131 mg/dL
Hgb A1c MFr Bld: 6.2 % — ABNORMAL HIGH (ref 4.8–5.6)

## 2014-02-08 ENCOUNTER — Encounter: Payer: Self-pay | Admitting: *Deleted

## 2014-02-13 ENCOUNTER — Other Ambulatory Visit: Payer: Self-pay

## 2014-02-13 DIAGNOSIS — F329 Major depressive disorder, single episode, unspecified: Secondary | ICD-10-CM

## 2014-02-13 DIAGNOSIS — F32A Depression, unspecified: Secondary | ICD-10-CM

## 2014-02-13 MED ORDER — DULOXETINE HCL 30 MG PO CPEP: 30.0000 mg | ORAL_CAPSULE | Freq: Every day | ORAL | Status: DC

## 2014-02-13 NOTE — Telephone Encounter (Signed)
Message left on triage VM by [facility] nurse Leodis Rains(Tracy Synder). [facility] was reviewing patient's medication list from last OV and patient needs rx for Cymbalta sent to [facility].  RX sent

## 2014-02-14 ENCOUNTER — Other Ambulatory Visit: Payer: Self-pay | Admitting: *Deleted

## 2014-02-14 DIAGNOSIS — F32A Depression, unspecified: Secondary | ICD-10-CM

## 2014-02-14 DIAGNOSIS — F329 Major depressive disorder, single episode, unspecified: Secondary | ICD-10-CM

## 2014-02-14 MED ORDER — DULOXETINE HCL 30 MG PO CPEP: 30.0000 mg | ORAL_CAPSULE | Freq: Every day | ORAL | Status: DC

## 2014-02-22 ENCOUNTER — Other Ambulatory Visit: Payer: Self-pay | Admitting: Internal Medicine

## 2014-02-22 ENCOUNTER — Other Ambulatory Visit: Payer: Self-pay | Admitting: Nurse Practitioner

## 2014-02-23 ENCOUNTER — Other Ambulatory Visit: Payer: Self-pay | Admitting: Internal Medicine

## 2014-02-23 NOTE — Progress Notes (Signed)
Received a note from the pharmacy--pt was taking both paxil and cymbalta.  D/c paxil.  Cont cymbalta as this will help her pain and her mood.

## 2014-02-24 DIAGNOSIS — E119 Type 2 diabetes mellitus without complications: Secondary | ICD-10-CM

## 2014-02-24 DIAGNOSIS — G309 Alzheimer's disease, unspecified: Secondary | ICD-10-CM

## 2014-02-24 DIAGNOSIS — F0280 Dementia in other diseases classified elsewhere without behavioral disturbance: Secondary | ICD-10-CM

## 2014-02-24 DIAGNOSIS — F02818 Dementia in other diseases classified elsewhere, unspecified severity, with other behavioral disturbance: Secondary | ICD-10-CM

## 2014-02-24 DIAGNOSIS — R32 Unspecified urinary incontinence: Secondary | ICD-10-CM

## 2014-02-24 DIAGNOSIS — F0281 Dementia in other diseases classified elsewhere with behavioral disturbance: Secondary | ICD-10-CM

## 2014-03-01 ENCOUNTER — Other Ambulatory Visit: Payer: Self-pay | Admitting: Internal Medicine

## 2014-03-28 ENCOUNTER — Other Ambulatory Visit: Payer: Self-pay | Admitting: Nurse Practitioner

## 2014-03-28 ENCOUNTER — Other Ambulatory Visit: Payer: Self-pay | Admitting: Internal Medicine

## 2014-04-19 ENCOUNTER — Other Ambulatory Visit: Payer: Self-pay | Admitting: Internal Medicine

## 2014-04-21 ENCOUNTER — Other Ambulatory Visit: Payer: Self-pay

## 2014-04-21 MED ORDER — DICLOFENAC SODIUM 1 % TD GEL: TRANSDERMAL | Status: DC

## 2014-04-21 NOTE — Telephone Encounter (Signed)
Refill was received via manual fax indicating: Please considered authorizing a larger quantity. Based on the current directions if patient used this around the clock one 100 gm tube would only last approximately 4 days.   I called the pharmacy, patient will need 600 gm for a month supply.  I called patient, patient states she is using Voltaren about 3-4 times daily.  RX sent for 600 mg

## 2014-04-25 DIAGNOSIS — F0281 Dementia in other diseases classified elsewhere with behavioral disturbance: Secondary | ICD-10-CM

## 2014-04-25 DIAGNOSIS — E119 Type 2 diabetes mellitus without complications: Secondary | ICD-10-CM

## 2014-04-25 DIAGNOSIS — R339 Retention of urine, unspecified: Secondary | ICD-10-CM

## 2014-04-25 DIAGNOSIS — G309 Alzheimer's disease, unspecified: Secondary | ICD-10-CM

## 2014-04-25 DIAGNOSIS — F02818 Dementia in other diseases classified elsewhere, unspecified severity, with other behavioral disturbance: Secondary | ICD-10-CM

## 2014-04-25 DIAGNOSIS — F0280 Dementia in other diseases classified elsewhere without behavioral disturbance: Secondary | ICD-10-CM

## 2014-04-26 ENCOUNTER — Other Ambulatory Visit: Payer: Self-pay | Admitting: Internal Medicine

## 2014-04-27 ENCOUNTER — Ambulatory Visit (INDEPENDENT_AMBULATORY_CARE_PROVIDER_SITE_OTHER): Payer: Medicare Other | Admitting: Podiatrist

## 2014-04-27 DIAGNOSIS — M79609 Pain in unspecified limb: Secondary | ICD-10-CM

## 2014-04-27 DIAGNOSIS — M79606 Pain in leg, unspecified: Secondary | ICD-10-CM

## 2014-04-27 DIAGNOSIS — B351 Tinea unguium: Secondary | ICD-10-CM

## 2014-04-27 NOTE — Progress Notes (Signed)
HPI:  Patient presents today for follow up of foot and nail care.  Relates I didn't cut the nails short enough last visit-- and the visit before complained that they were too jagged.  No new medical complaints related.   Objective:  Patients chart is reviewed.  Vascular status reveals pedal pulses noted at 2 out of 4 dp and pt bilateral .  Neurological sensation is Decreased to [facility] monofil[facility] bilateral.  Patients nails x 6 are thickened, discolored, distrophic, friable and brittle with yellow-brown discoloration. Patient subjectively relates they are painful with shoes and with ambulation of bilateral feet.  Assessment:  Symptomatic onychomycosis x 6  Plan:  Discussed treatment options and alternatives.  The symptomatic toenails were debrided through manual an mechanical means without complication.  Return appointment recommended at routine intervals of 3 months    Marlowe AschoffKathryn Viet Kemmerer, DPM

## 2014-05-05 ENCOUNTER — Ambulatory Visit: Payer: Medicare Other | Admitting: Internal Medicine

## 2014-05-24 ENCOUNTER — Other Ambulatory Visit: Payer: Self-pay | Admitting: Nurse Practitioner

## 2014-05-24 ENCOUNTER — Other Ambulatory Visit: Payer: Self-pay | Admitting: Internal Medicine

## 2014-06-14 ENCOUNTER — Other Ambulatory Visit: Payer: Self-pay | Admitting: Internal Medicine

## 2014-06-19 ENCOUNTER — Encounter: Payer: Self-pay | Admitting: Internal Medicine

## 2014-06-19 ENCOUNTER — Ambulatory Visit (INDEPENDENT_AMBULATORY_CARE_PROVIDER_SITE_OTHER): Payer: Medicare Other | Admitting: Internal Medicine

## 2014-06-19 ENCOUNTER — Ambulatory Visit: Payer: Self-pay | Admitting: Internal Medicine

## 2014-06-19 VITALS — BP 132/78 | HR 69 | Temp 97.9°F | Ht 61.5 in | Wt 176.0 lb

## 2014-06-19 DIAGNOSIS — Z5181 Encounter for therapeutic drug level monitoring: Secondary | ICD-10-CM

## 2014-06-19 DIAGNOSIS — L409 Psoriasis, unspecified: Secondary | ICD-10-CM

## 2014-06-19 DIAGNOSIS — F22 Delusional disorders: Secondary | ICD-10-CM

## 2014-06-19 DIAGNOSIS — E1149 Type 2 diabetes mellitus with other diabetic neurological complication: Secondary | ICD-10-CM

## 2014-06-19 DIAGNOSIS — I10 Essential (primary) hypertension: Secondary | ICD-10-CM

## 2014-06-19 DIAGNOSIS — M1710 Unilateral primary osteoarthritis, unspecified knee: Secondary | ICD-10-CM

## 2014-06-19 DIAGNOSIS — L408 Other psoriasis: Secondary | ICD-10-CM

## 2014-06-19 DIAGNOSIS — K8689 Other specified diseases of pancreas: Secondary | ICD-10-CM

## 2014-06-19 DIAGNOSIS — M170 Bilateral primary osteoarthritis of knee: Secondary | ICD-10-CM

## 2014-06-19 DIAGNOSIS — F0150 Vascular dementia without behavioral disturbance: Secondary | ICD-10-CM

## 2014-06-19 MED ORDER — ATENOLOL 50 MG PO TABS: 50.0000 mg | ORAL_TABLET | Freq: Every day | ORAL | Status: DC

## 2014-06-19 MED ORDER — CALCIPOTRIENE 0.005 % EX SOLN: CUTANEOUS | Status: DC

## 2014-06-19 MED ORDER — DIAZEPAM 2 MG PO TABS: 2.0000 mg | ORAL_TABLET | Freq: Three times a day (TID) | ORAL | Status: DC | PRN

## 2014-06-19 MED ORDER — PANCRELIPASE (LIP-PROT-AMYL) 36000-114000 UNITS PO CPEP: 36000.0000 [IU] | ORAL_CAPSULE | Freq: Three times a day (TID) | ORAL | Status: DC
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
## 2014-06-19 NOTE — Patient Instructions (Signed)
You need to take the metformin medication with food so you don't get diarrhea. Also take creon capsules with each meal.

## 2014-06-20 LAB — COMPREHENSIVE METABOLIC PANEL
ALT: 7 IU/L (ref 0–32)
AST: 13 IU/L (ref 0–40)
Albumin/Globulin Ratio: 1.3 (ref 1.1–2.5)
Albumin: 4.1 g/dL (ref 3.5–4.8)
Alkaline Phosphatase: 139 IU/L — ABNORMAL HIGH (ref 39–117)
BUN/Creatinine Ratio: 26 (ref 11–26)
BUN: 20 mg/dL (ref 8–27)
CO2: 24 mmol/L (ref 18–29)
Calcium: 10.5 mg/dL — ABNORMAL HIGH (ref 8.7–10.3)
Chloride: 101 mmol/L (ref 97–108)
Creatinine, Ser: 0.76 mg/dL (ref 0.57–1.00)
GFR calc Af Amer: 90 mL/min/{1.73_m2} (ref >59)
GFR calc non Af Amer: 78 mL/min/{1.73_m2} (ref >59)
Globulin, Total: 3.1 g/dL (ref 1.5–4.5)
Glucose: 89 mg/dL (ref 65–99)
Potassium: 4.4 mmol/L (ref 3.5–5.2)
Sodium: 142 mmol/L (ref 134–144)
Total Bilirubin: <0.2 mg/dL (ref 0.0–1.2)
Total Protein: 7.2 g/dL (ref 6.0–8.5)

## 2014-06-20 LAB — CBC WITH DIFFERENTIAL
Basophils Absolute: 0 10*3/uL (ref 0.0–0.2)
Basos: 0 %
Eos: 3 %
Eosinophils Absolute: 0.3 10*3/uL (ref 0.0–0.4)
HCT: 42 % (ref 34.0–46.6)
Hemoglobin: 13.5 g/dL (ref 11.1–15.9)
Immature Grans (Abs): 0 10*3/uL (ref 0.0–0.1)
Immature Granulocytes: 0 %
Lymphocytes Absolute: 6.1 10*3/uL — ABNORMAL HIGH (ref 0.7–3.1)
Lymphs: 58 %
MCH: 28 pg (ref 26.6–33.0)
MCHC: 32.1 g/dL (ref 31.5–35.7)
MCV: 87 fL (ref 79–97)
Monocytes Absolute: 0.5 10*3/uL (ref 0.1–0.9)
Monocytes: 5 %
Neutrophils Absolute: 3.6 10*3/uL (ref 1.4–7.0)
Neutrophils Relative %: 34 %
Platelets: 188 10*3/uL (ref 150–379)
RBC: 4.83 x10E6/uL (ref 3.77–5.28)
RDW: 17.6 % — ABNORMAL HIGH (ref 12.3–15.4)
WBC: 10.6 10*3/uL (ref 3.4–10.8)

## 2014-06-20 LAB — HEMOGLOBIN A1C
Est. average glucose Bld gHb Est-mCnc: 128 mg/dL
Hgb A1c MFr Bld: 6.1 % — ABNORMAL HIGH (ref 4.8–5.6)

## 2014-06-21 ENCOUNTER — Other Ambulatory Visit: Payer: Self-pay | Admitting: Internal Medicine

## 2014-06-21 NOTE — Telephone Encounter (Signed)
All medications were verified as being on patient's medication list at requested dose and instructions

## 2014-06-24 DIAGNOSIS — G309 Alzheimer's disease, unspecified: Secondary | ICD-10-CM

## 2014-06-24 DIAGNOSIS — F0280 Dementia in other diseases classified elsewhere without behavioral disturbance: Secondary | ICD-10-CM

## 2014-06-24 DIAGNOSIS — R32 Unspecified urinary incontinence: Secondary | ICD-10-CM

## 2014-06-24 DIAGNOSIS — F02818 Dementia in other diseases classified elsewhere, unspecified severity, with other behavioral disturbance: Secondary | ICD-10-CM

## 2014-06-24 DIAGNOSIS — F0281 Dementia in other diseases classified elsewhere with behavioral disturbance: Secondary | ICD-10-CM

## 2014-06-24 DIAGNOSIS — E119 Type 2 diabetes mellitus without complications: Secondary | ICD-10-CM

## 2014-06-26 ENCOUNTER — Telehealth: Payer: Self-pay

## 2014-06-26 NOTE — Telephone Encounter (Signed)
French Anaracy from [facility] called to inform Dr.Reed that Robinul was changed to Bentyl 10 mg QID. Patient's insurance company would not cover Robinul.   Message will be sent to Dr.Reed as a FYI, medication list updated to reflect change

## 2014-07-03 ENCOUNTER — Other Ambulatory Visit: Payer: Medicare Other

## 2014-07-18 ENCOUNTER — Other Ambulatory Visit: Payer: Self-pay | Admitting: Internal Medicine

## 2014-07-19 ENCOUNTER — Other Ambulatory Visit: Payer: Self-pay | Admitting: *Deleted

## 2014-07-19 MED ORDER — POTASSIUM CHLORIDE CRYS ER 20 MEQ PO TBCR: EXTENDED_RELEASE_TABLET | ORAL | Status: DC

## 2014-07-19 NOTE — Telephone Encounter (Signed)
[facility] 

## 2014-07-19 NOTE — Telephone Encounter (Signed)
Spoke with Jesusita Oka at pharmacy

## 2014-07-27 ENCOUNTER — Other Ambulatory Visit: Payer: Self-pay | Admitting: Internal Medicine

## 2014-08-03 ENCOUNTER — Encounter: Payer: Self-pay | Admitting: Podiatrist

## 2014-08-03 ENCOUNTER — Ambulatory Visit (INDEPENDENT_AMBULATORY_CARE_PROVIDER_SITE_OTHER): Payer: Medicare Other | Admitting: Podiatrist

## 2014-08-03 DIAGNOSIS — M79676 Pain in unspecified toe(s): Principal | ICD-10-CM

## 2014-08-03 DIAGNOSIS — B351 Tinea unguium: Secondary | ICD-10-CM

## 2014-08-03 DIAGNOSIS — M79609 Pain in unspecified limb: Secondary | ICD-10-CM

## 2014-08-07 NOTE — Progress Notes (Signed)
HPI: Patient presents today for follow up of foot and nail care. Requests the toenails be cut back as short as possible today.  Objective: Patients chart is reviewed. Vascular status reveals pedal pulses noted at 2 out of 4 dp and pt bilateral . Neurological sensation is Decreased to Semmes Weinstein monofilament bilateral. Patients nails x 6 are thickened, discolored, distrophic, friable and brittle with yellow-brown discoloration. Patient subjectively relates they are painful with shoes and with ambulation of bilateral feet.  Assessment: Symptomatic onychomycosis x 6  Plan: Discussed treatment options and alternatives. The symptomatic toenails were debrided through manual an mechanical means without complication. Return appointment recommended at routine intervals of 3 months   

## 2014-08-23 DIAGNOSIS — E119 Type 2 diabetes mellitus without complications: Secondary | ICD-10-CM

## 2014-08-23 DIAGNOSIS — F0281 Dementia in other diseases classified elsewhere with behavioral disturbance: Secondary | ICD-10-CM

## 2014-08-23 DIAGNOSIS — R32 Unspecified urinary incontinence: Secondary | ICD-10-CM

## 2014-08-23 DIAGNOSIS — G309 Alzheimer's disease, unspecified: Secondary | ICD-10-CM

## 2014-08-24 ENCOUNTER — Telehealth: Payer: Self-pay | Admitting: *Deleted

## 2014-08-24 NOTE — Telephone Encounter (Signed)
[facility] called and stated that she wanted to discuss with you the mental decline in this patient. Patient is a [facility] patient and they need a annual review and wants to speak with you concerning her. Please call Amanda Adkins at # [phone]

## 2014-08-24 NOTE — Telephone Encounter (Signed)
Also received Form for Dr. Renato Gailseed to sign for Incontinence supplies Undergarments 90/month and Chux 120/month. Given to Dr. Renato Gailseed to sign off.

## 2014-08-29 ENCOUNTER — Other Ambulatory Visit: Payer: Self-pay | Admitting: Internal Medicine

## 2014-09-01 NOTE — Telephone Encounter (Signed)
Pt needs an appt

## 2014-09-04 NOTE — Telephone Encounter (Signed)
Called and left message for Amanda Adkins that patient needs an appointment per Dr. Renato Gailseed. Told her on the message that patient does have a appointment for 11/19 but if she thought she needed a sooner appointment to call us back so we could change it.

## 2014-09-11 ENCOUNTER — Other Ambulatory Visit: Payer: Self-pay | Admitting: Internal Medicine

## 2014-09-12 ENCOUNTER — Other Ambulatory Visit: Payer: Self-pay | Admitting: *Deleted

## 2014-09-12 MED ORDER — DIAZEPAM 2 MG PO TABS: ORAL_TABLET | ORAL | Status: DC

## 2014-09-12 NOTE — Telephone Encounter (Signed)
RX would not print for CMA Clide Deutscher(Ashley Greely), I reprinted

## 2014-09-14 ENCOUNTER — Other Ambulatory Visit: Payer: Self-pay | Admitting: Internal Medicine

## 2014-09-28 ENCOUNTER — Ambulatory Visit (INDEPENDENT_AMBULATORY_CARE_PROVIDER_SITE_OTHER): Payer: Medicare Other | Admitting: *Deleted

## 2014-09-28 ENCOUNTER — Encounter: Payer: Self-pay | Admitting: Internal Medicine

## 2014-09-28 ENCOUNTER — Ambulatory Visit (INDEPENDENT_AMBULATORY_CARE_PROVIDER_SITE_OTHER): Payer: Medicare Other | Admitting: Internal Medicine

## 2014-09-28 VITALS — BP 130/70 | HR 65 | Temp 97.9°F | Ht 59.0 in | Wt 179.4 lb

## 2014-09-28 DIAGNOSIS — F22 Delusional disorders: Secondary | ICD-10-CM

## 2014-09-28 DIAGNOSIS — F0150 Vascular dementia without behavioral disturbance: Secondary | ICD-10-CM

## 2014-09-28 DIAGNOSIS — L409 Psoriasis, unspecified: Secondary | ICD-10-CM

## 2014-09-28 DIAGNOSIS — E119 Type 2 diabetes mellitus without complications: Secondary | ICD-10-CM

## 2014-09-28 DIAGNOSIS — Z23 Encounter for immunization: Secondary | ICD-10-CM

## 2014-09-28 DIAGNOSIS — I10 Essential (primary) hypertension: Secondary | ICD-10-CM

## 2014-09-28 DIAGNOSIS — M170 Bilateral primary osteoarthritis of knee: Secondary | ICD-10-CM

## 2014-09-28 DIAGNOSIS — B372 Candidiasis of skin and nail: Secondary | ICD-10-CM

## 2014-09-28 MED ORDER — ESOMEPRAZOLE MAGNESIUM 40 MG PO CPDR: 40.0000 mg | DELAYED_RELEASE_CAPSULE | Freq: Every day | ORAL | Status: DC

## 2014-09-28 NOTE — Progress Notes (Signed)
Patient ID: Amanda Adkins, female   DOB: 07-06-1941, 73 y.o.   MRN: [id number]   Location:  [facility]  Code Status: full code--discussed what it means today and she would still want CPR, intubation, defibrillation, mechanical ventilation if needed  Allergies  Allergen Reactions  . Buspar [Buspirone] Hives  . Percocet [Oxycodone-Acetaminophen]     Hallucination  . Vicodin [Hydrocodone-Acetaminophen]     hallucination    Chief Complaint  Patient presents with  . Follow-up    3 month Follow up    HPI: Patient is a 73 y.o. black female seen in the office today for medical mgt of her chronic diseases.  She seems to be doing better today.    Her daughter has been helping her with her hair and creams for her psoriasis.  Apparently, she does not know which of her creams and ointments go there b/c she's also had yeast skin infection.    She says she has her usual aches in her shoulders and knees but they are improved with her current meds.    She did not have any paranoid delusions today.    She seems to be tolerating a purely oral regimen for her diabetes.    She had no GI complaints today.  Review of Systems:  Review of Systems  Constitutional: Positive for weight loss. Negative for fever, chills and malaise/fatigue.  HENT: Positive for hearing loss.   Eyes: Negative for blurred vision.       Just picked up new glasses today  Respiratory: Negative for cough and shortness of breath.   Cardiovascular: Negative for chest pain and leg swelling.  Gastrointestinal: Negative for abdominal pain, diarrhea, constipation, blood in stool and melena.  Genitourinary: Negative for dysuria.  Musculoskeletal: Positive for joint pain and falls.  Neurological: Negative for dizziness, loss of consciousness, weakness and headaches.  Endo/Heme/Allergies: Bruises/bleeds easily.  Psychiatric/Behavioral: Positive for memory loss.     Past Medical History    Diagnosis Date  . Diabetes mellitus without complication   . Anxiety   . GERD (gastroesophageal reflux disease)   . Vitamin B12 deficiency   . Hyperlipidemia   . Tobacco use disorder   . Posttraumatic stress disorder   . Hypertension   . Allergy   . Allergic rhinitis due to pollen   . Osteoporosis   . Other malaise and fatigue   . Abnormality of gait   . Hepatomegaly   . History of fall   . Muscle weakness (generalized)   . Diarrhea   . Unspecified constipation   . Abdominal pain, generalized   . Kyphosis (acquired) (postural)   . Posttraumatic stress disorder   . Other psoriasis   . Abnormality of gait   . Other B-complex deficiencies   . Dementia in conditions classified elsewhere without behavioral disturbance   . Pain   . Other malaise and fatigue   . Personal history of fall   . Hypercalcemia   . Senile osteoporosis   . Cervicitis and endocervicitis   . Unspecified pruritic disorder   . Closed dislocation of shoulder, unspecified site   . Leukocytosis, unspecified   . Other specified pre-operative examination   . Abdominal pain, generalized   . Tinnitus   . Pain in joint, shoulder region   . Pain in joint, lower leg   . Impacted cerumen   . Diarrhea   . Hypopotassemia   . Diverticulosis of colon (without mention of hemorrhage)   .  Tobacco use disorder   . Reflux esophagitis   . Cough   . Abnormality of gait   . Acute bronchitis   . 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 Hepatomegaly   . Dermatitis   . Senile dementia, uncomplicated   . Acute sinusitis, unspecified   . Type II or unspecified type diabetes mellitus without mention of complication, uncontrolled   . Contact with or exposure to other communicable diseases(V01.89)   . Candidiasis of vulva and vagina   . Type II or unspecified type diabetes mellitus without mention of complication, not stated as uncontrolled   . Other and  unspecified hyperlipidemia   . Anxiety state, unspecified   . Unspecified essential hypertension   . Allergic rhinitis due to pollen   . Pain in joint, site unspecified   . Stiffness of joint, not elsewhere classified, unspecified site   . Abdominal pain, unspecified site   . Vascular dementia   . Shoulder dislocation   . Psoriasis   . Constipation, chronic   . Senile osteoporosis   . Osteoarthritis of both knees   . Hearing loss sensory, bilateral   . Tobacco abuse   . Chronic pain of both shoulders     Past Surgical History  Procedure Laterality Date  . Bladder surgery    . Spine surgery    . Orif patella Left 04/08/2013    Procedure: OPEN REDUCTION INTERNAL (ORIF) FIXATION PATELLA;  Surgeon: Eldred Manges, MD;  Location: [facility] OR;  Service: Orthopedics;  Laterality: Left;  Open Reduction Internal Fixation Left Patella Fracture    Social History:   reports that she has quit smoking. Her smoking use included Cigarettes. She has a 40 pack-year smoking history. She quit smokeless tobacco use about 16 months ago. She reports that she does not drink alcohol or use illicit drugs.  History reviewed. No pertinent family history.  Medications: Patient's Medications  New Prescriptions   No medications on file  Previous Medications   ACCU-CHEK AVIVA PLUS TEST STRIP    USE AS INSTRUCTED TO TEST BLOOD SUGAR   ACETAMINOPHEN (TYLENOL) 500 MG TABLET    Take 2 tablets (1,000 mg total) by mouth 3 (three) times daily. For arthritis pain   ATENOLOL (TENORMIN) 25 MG TABLET    TAKE 1 TABLET BY MOUTH EVERY DAY FOR BLOOD PRESSURE   ATENOLOL (TENORMIN) 50 MG TABLET    Take 1 tablet (50 mg total) by mouth daily.   ATORVASTATIN (LIPITOR) 10 MG TABLET    TAKE 1 TABLET BY MOUTH EVERY DAY   ATROVENT HFA 17 MCG/ACT INHALER    INHALE 2 PUFFS UP TO 4 TIMES DAILY AS NEEDED TO HELP BREATHING/WHEEZING/COUGH   ATROVENT HFA 17 MCG/ACT INHALER    INHALE 2 PUFFS UP TO 4 TIMES DAILY AS NEEDED TO HELP  BREATHING/WHEEZING/COUGH   CALCIPOTRIENE 0.005 % SOLUTION    Apply to scalp daily   CALCIPOTRIENE 0.005 % SOLUTION    USE DAILY FOR DRY SCALP   DIAZEPAM (VALIUM) 2 MG TABLET    TAKE 1 TABLET BY MOUTH EVERY 8 HOURS AS NEEDED FOR ANXIETY   DICLOFENAC SODIUM (VOLTAREN) 1 % GEL    APPLY 2 GRAMS TO SHOULDERS AND 4 GRAMS TO LEFT KNEE FOUR TIMES A DAY   DICYCLOMINE (BENTYL) 10 MG CAPSULE    Take 10 mg by mouth 4 (four) times daily.   DONEPEZIL (ARICEPT) 10 MG TABLET    TAKE 1  TABLET BY MOUTH EVERY NIGHT AT BEDTIME   DULOXETINE (CYMBALTA) 30 MG CAPSULE    Take 1 capsule (30 mg total) by mouth daily.   DULOXETINE (CYMBALTA) 30 MG CAPSULE    TAKE 1 CAPSULE BY MOUTH DAILY   FLUOCINOLONE (SYNALAR) 0.025 % OINTMENT    APPLY TO AFFECTED AREA ON ARMS   FUROSEMIDE (LASIX) 20 MG TABLET    TAKE 1 TABLET BY MOUTH EVERY DAY ** STOP HYDROCHLOROTHIAZIDE   GABAPENTIN (NEURONTIN) 300 MG CAPSULE    TAKE 1 CAPSULE BY MOUTH EVERY DAY FOR NERVE PAIN   HYDROCORTISONE 2.5 % LOTION    APPLY TO NECK AREA TWICE DAILY AS NEEDED FOR ITCHY RASH   LIPASE/PROTEASE/AMYLASE (CREON) 36000 UNITS CPEP CAPSULE    Take 1 capsule (36,000 Units total) by mouth 3 (three) times daily before meals.   METFORMIN (GLUCOPHAGE) 500 MG TABLET    TAKE 1 TABLET BY MOUTH TWICE DAILY WITH A MEAL FOR DIABETES   NAMENDA XR 28 MG CP24    TAKE 1 CAPSULE BY MOUTH DAILY   NEXIUM 40 MG CAPSULE    TAKE 1 CAPSULE BY MOUTH DAILY   NICOTINE (NICODERM CQ - DOSED IN MG/24 HR) 7 MG/24HR PATCH    APPLY 1 PATCH DAILY AS DIRECTED (CHANGE DAILY)   NON FORMULARY    Formula 1117 cream, 360 grams, with 5 refills.   Use as directed for knee pain   NYSTATIN CREAM (MYCOSTATIN)    APPLY TO AFFECTED AREA TWICE DAILY   POTASSIUM CHLORIDE SA (K-DUR,KLOR-CON) 20 MEQ TABLET    Take one tablet by mouth twice daily   PROAIR HFA 108 (90 BASE) MCG/ACT INHALER    INHALE 2 PUFFS UP TO 4 TIMES DAILY AS NEEDED TO HELP BREATHING/WHEEZING/COUGH   RAMIPRIL (ALTACE) 10 MG CAPSULE    TAKE 1  CAPSULE BY MOUTH EVERY DAY FOR BLOOD PRESSURE   RISPERIDONE (RISPERDAL) 0.25 MG TABLET    TAKE 1 TABLET BY MOUTH EVERY NIGHT AT BEDTIME   SUCRALFATE (CARAFATE) 1 G TABLET    TAKE 1 TABLET BY MOUTH BEFORE MEALS AND AT BEDTIME TO PROTECT STOMACH   TRAZODONE (DESYREL) 150 MG TABLET    TAKE 1 TABLET BY MOUTH NIGHTLY TO HELP WITH NERVES AND REST  Modified Medications   No medications on file  Discontinued Medications   No medications on file     Physical Exam: Filed Vitals:   09/28/14 1051  BP: 130/70  Pulse: 65  Temp: 97.9 F (36.6 C)  TempSrc: Oral  Height: 4\' 11"  (1.499 m)  Weight: 179 lb 6.4 oz (81.375 kg)  SpO2: 94%  Physical Exam  Constitutional: No distress.  HENT:  Head: Normocephalic and atraumatic.  Cardiovascular: Normal rate, regular rhythm, normal heart sounds and intact distal pulses.   Pulmonary/Chest: Effort normal and breath sounds normal. No respiratory distress.  Abdominal: Soft. Bowel sounds are normal. She exhibits no distension and no mass. There is no tenderness.  Musculoskeletal: Normal range of motion. She exhibits tenderness. She exhibits no edema.  Has stooped posture, walking unassisted today, but unsteady;  Does not like her regular walker and wants a rollator  Neurological: She is alert.  Oriented to person and place, not precise time, repeats stories  Skin: Skin is warm and dry.  Psychiatric: She has a normal mood and affect.    Labs reviewed: Basic Metabolic Panel:  Recent Labs  16/08/9600/08/15 1222 02/02/14 1407 06/19/14 1243  NA 139 141 142  K 4.3 4.5 4.4  CL  100 101 101  CO2 21 22 24   GLUCOSE 111* 68 89  BUN 20 19 20   CREATININE 0.77 0.88 0.76  CALCIUM 9.9 10.0 10.5*   Liver Function Tests:  Recent Labs  11/17/13 1222 02/02/14 1407 06/19/14 1243  AST 12 11 13   ALT 8 5 7   ALKPHOS 130* 115 139*  BILITOT <0.2 <0.2 <0.2  PROT 7.6 7.4 7.2   No results for input(s): LIPASE, AMYLASE in the last 8760 hours. No results for input(s):  AMMONIA in the last 8760 hours. CBC:  Recent Labs  11/17/13 1222 02/02/14 1407 06/19/14 1243  WBC 11.9* 9.9 10.6  NEUTROABS 4.5 2.8 3.6  HGB 13.3 12.9 13.5  HCT 40.9 41.5 42.0  MCV 86 87 87  PLT  --  220 188   Lipid Panel:  Recent Labs  11/17/13 1222  HDL 52  LDLCALC 65  TRIG 197*  CHOLHDL 3.0   Lab Results  Component Value Date   HGBA1C 6.1* 06/19/2014    Assessment/Plan 1. Psoriasis -pt was not applying proper topical agents to proper places so areas had to be specified on instructions -cont fluocinolide cream to arms and behind ears, as well as other areas of psoriasis  2. Candidal skin infection -cont nystatin cream to groin and skin folds  3. Vascular dementia, without behavioral disturbance -stable, seems better today than usual, cont aricept and namenda--did not change today to avoid potential confusion   4. Essential hypertension, benign -bp at goal for age   50. Primary osteoarthritis of both knees -cont voltaren gel, tylenol and trying to walk for exercise with walker  6. Diabetes mellitus type II, controlled -f/u hba1c today to see how it's doing  7. Psychosis, paranoid -better today, cont risperdal--I think his daughter's been helping and this is why she is doing better (getting her meds)  8. Need for prophylactic vaccination and inoculation against influenza -flu shot given Labs/tests ordered:   Orders Placed This Encounter  Procedures  . CBC With differential/Platelet  . Comprehensive metabolic panel    Order Specific Question:  Has the patient fasted?    Answer:  Yes  . Hemoglobin A1c  . Lipid panel    Order Specific Question:  Has the patient fasted?    Answer:  Yes    Next appt:  3 mos prevnar shot next time   Deaaron Fulghum L. Deren Degrazia, D.O. Geriatrics [facility] [address], [place] [address] Cell Phone (Mon-Fri 8am-5pm):  [phone] On Call:  [phone] & follow prompts after 5pm &  weekends Office Phone:  [phone] Office Fax:  [phone]

## 2014-09-29 LAB — COMPREHENSIVE METABOLIC PANEL
ALT: 8 IU/L (ref 0–32)
AST: 10 IU/L (ref 0–40)
Albumin/Globulin Ratio: 1.5 (ref 1.1–2.5)
Albumin: 4.2 g/dL (ref 3.5–4.8)
Alkaline Phosphatase: 93 IU/L (ref 39–117)
BUN/Creatinine Ratio: 21 (ref 11–26)
BUN: 14 mg/dL (ref 8–27)
CO2: 26 mmol/L (ref 18–29)
Calcium: 9.6 mg/dL (ref 8.7–10.3)
Chloride: 99 mmol/L (ref 97–108)
Creatinine, Ser: 0.68 mg/dL (ref 0.57–1.00)
GFR calc Af Amer: 100 mL/min/{1.73_m2} (ref >59)
GFR calc non Af Amer: 86 mL/min/{1.73_m2} (ref >59)
Globulin, Total: 2.8 g/dL (ref 1.5–4.5)
Glucose: 79 mg/dL (ref 65–99)
Potassium: 4.1 mmol/L (ref 3.5–5.2)
Sodium: 140 mmol/L (ref 134–144)
Total Bilirubin: 0.4 mg/dL (ref 0.0–1.2)
Total Protein: 7 g/dL (ref 6.0–8.5)

## 2014-09-29 LAB — CBC WITH DIFFERENTIAL
Basophils Absolute: 0 10*3/uL (ref 0.0–0.2)
Basos: 0 %
Eos: 2 %
Eosinophils Absolute: 0.3 10*3/uL (ref 0.0–0.4)
HCT: 37 % (ref 34.0–46.6)
Hemoglobin: 12.1 g/dL (ref 11.1–15.9)
Immature Grans (Abs): 0 10*3/uL (ref 0.0–0.1)
Immature Granulocytes: 0 %
Lymphocytes Absolute: 6.3 10*3/uL — ABNORMAL HIGH (ref 0.7–3.1)
Lymphs: 57 %
MCH: 29.1 pg (ref 26.6–33.0)
MCHC: 32.7 g/dL (ref 31.5–35.7)
MCV: 89 fL (ref 79–97)
Monocytes Absolute: 0.5 10*3/uL (ref 0.1–0.9)
Monocytes: 4 %
Neutrophils Absolute: 4.1 10*3/uL (ref 1.4–7.0)
Neutrophils Relative %: 37 %
Platelets: 195 10*3/uL (ref 150–379)
RBC: 4.16 x10E6/uL (ref 3.77–5.28)
RDW: 16.1 % — ABNORMAL HIGH (ref 12.3–15.4)
WBC: 11.2 10*3/uL — ABNORMAL HIGH (ref 3.4–10.8)

## 2014-09-29 LAB — LIPID PANEL
Chol/HDL Ratio: 2.5 ratio units (ref 0.0–4.4)
Cholesterol, Total: 144 mg/dL (ref 100–199)
HDL: 57 mg/dL (ref >39)
LDL Calculated: 61 mg/dL (ref 0–99)
Triglycerides: 128 mg/dL (ref 0–149)
VLDL Cholesterol Cal: 26 mg/dL (ref 5–40)

## 2014-09-29 LAB — HEMOGLOBIN A1C
Est. average glucose Bld gHb Est-mCnc: 128 mg/dL
Hgb A1c MFr Bld: 6.1 % — ABNORMAL HIGH (ref 4.8–5.6)

## 2014-10-09 ENCOUNTER — Other Ambulatory Visit: Payer: Self-pay | Admitting: Internal Medicine

## 2014-10-11 ENCOUNTER — Other Ambulatory Visit: Payer: Self-pay | Admitting: Internal Medicine

## 2014-10-13 ENCOUNTER — Telehealth: Payer: Self-pay | Admitting: *Deleted

## 2014-10-13 NOTE — Telephone Encounter (Signed)
French Anaracy with [facility] called and stated that she needed verbal orders to recertify patient. Verbal given

## 2014-10-22 DIAGNOSIS — R32 Unspecified urinary incontinence: Secondary | ICD-10-CM

## 2014-10-22 DIAGNOSIS — F0281 Dementia in other diseases classified elsewhere with behavioral disturbance: Secondary | ICD-10-CM

## 2014-10-22 DIAGNOSIS — G309 Alzheimer's disease, unspecified: Secondary | ICD-10-CM

## 2014-10-22 DIAGNOSIS — E119 Type 2 diabetes mellitus without complications: Secondary | ICD-10-CM

## 2014-10-27 ENCOUNTER — Ambulatory Visit (INDEPENDENT_AMBULATORY_CARE_PROVIDER_SITE_OTHER): Payer: Medicare Other | Admitting: Podiatrist

## 2014-10-27 ENCOUNTER — Encounter: Payer: Self-pay | Admitting: Podiatrist

## 2014-10-27 DIAGNOSIS — M79676 Pain in unspecified toe(s): Secondary | ICD-10-CM

## 2014-10-27 DIAGNOSIS — B351 Tinea unguium: Secondary | ICD-10-CM

## 2014-10-27 NOTE — Progress Notes (Signed)
HPI: Patient presents today for follow up of foot and nail care. Requests the toenails be cut back as short as possible today.  Objective: Patients chart is reviewed. Vascular status reveals pedal pulses noted at 2 out of 4 dp and pt bilateral . Neurological sensation is Decreased to [facility] monofil[facility] bilateral. Patients nails x 6 are thickened, discolored, distrophic, friable and brittle with yellow-brown discoloration. Patient subjectively relates they are painful with shoes and with ambulation of bilateral feet.  Assessment: Symptomatic onychomycosis x 6  Plan: Discussed treatment options and alternatives. The symptomatic toenails were debrided through manual an mechanical means without complication. Return appointment recommended at routine intervals of 3 months

## 2014-11-02 ENCOUNTER — Other Ambulatory Visit: Payer: Self-pay | Admitting: Internal Medicine

## 2014-11-02 ENCOUNTER — Ambulatory Visit: Payer: Medicare Other | Admitting: Podiatrist

## 2014-11-07 ENCOUNTER — Other Ambulatory Visit: Payer: Self-pay | Admitting: Internal Medicine

## 2014-11-13 DIAGNOSIS — Z794 Long term (current) use of insulin: Secondary | ICD-10-CM | POA: Diagnosis not present

## 2014-11-13 DIAGNOSIS — R32 Unspecified urinary incontinence: Secondary | ICD-10-CM | POA: Diagnosis not present

## 2014-11-13 DIAGNOSIS — M13869 Other specified arthritis, unspecified knee: Secondary | ICD-10-CM | POA: Diagnosis not present

## 2014-11-13 DIAGNOSIS — F0281 Dementia in other diseases classified elsewhere with behavioral disturbance: Secondary | ICD-10-CM | POA: Diagnosis not present

## 2014-11-13 DIAGNOSIS — G8929 Other chronic pain: Secondary | ICD-10-CM | POA: Diagnosis not present

## 2014-11-13 DIAGNOSIS — G309 Alzheimer's disease, unspecified: Secondary | ICD-10-CM | POA: Diagnosis not present

## 2014-11-13 DIAGNOSIS — I10 Essential (primary) hypertension: Secondary | ICD-10-CM | POA: Diagnosis not present

## 2014-11-13 DIAGNOSIS — E119 Type 2 diabetes mellitus without complications: Secondary | ICD-10-CM | POA: Diagnosis not present

## 2014-11-27 ENCOUNTER — Other Ambulatory Visit: Payer: Self-pay | Admitting: Internal Medicine

## 2014-11-27 DIAGNOSIS — G309 Alzheimer's disease, unspecified: Secondary | ICD-10-CM | POA: Diagnosis not present

## 2014-11-27 DIAGNOSIS — I10 Essential (primary) hypertension: Secondary | ICD-10-CM | POA: Diagnosis not present

## 2014-11-27 DIAGNOSIS — M13869 Other specified arthritis, unspecified knee: Secondary | ICD-10-CM | POA: Diagnosis not present

## 2014-11-27 DIAGNOSIS — G8929 Other chronic pain: Secondary | ICD-10-CM | POA: Diagnosis not present

## 2014-11-27 DIAGNOSIS — E119 Type 2 diabetes mellitus without complications: Secondary | ICD-10-CM | POA: Diagnosis not present

## 2014-11-27 DIAGNOSIS — R32 Unspecified urinary incontinence: Secondary | ICD-10-CM | POA: Diagnosis not present

## 2014-11-27 DIAGNOSIS — Z794 Long term (current) use of insulin: Secondary | ICD-10-CM | POA: Diagnosis not present

## 2014-11-27 DIAGNOSIS — F0281 Dementia in other diseases classified elsewhere with behavioral disturbance: Secondary | ICD-10-CM | POA: Diagnosis not present

## 2014-12-06 ENCOUNTER — Other Ambulatory Visit: Payer: Self-pay | Admitting: Internal Medicine

## 2014-12-07 ENCOUNTER — Other Ambulatory Visit: Payer: Self-pay | Admitting: *Deleted

## 2014-12-07 MED ORDER — DIAZEPAM 2 MG PO TABS: 2.0000 mg | ORAL_TABLET | Freq: Three times a day (TID) | ORAL | Status: DC | PRN

## 2014-12-07 NOTE — Telephone Encounter (Signed)
[facility]

## 2014-12-11 ENCOUNTER — Telehealth: Payer: Self-pay | Admitting: *Deleted

## 2014-12-11 DIAGNOSIS — G8929 Other chronic pain: Secondary | ICD-10-CM | POA: Diagnosis not present

## 2014-12-11 DIAGNOSIS — E119 Type 2 diabetes mellitus without complications: Secondary | ICD-10-CM | POA: Diagnosis not present

## 2014-12-11 DIAGNOSIS — I10 Essential (primary) hypertension: Secondary | ICD-10-CM | POA: Diagnosis not present

## 2014-12-11 DIAGNOSIS — Z794 Long term (current) use of insulin: Secondary | ICD-10-CM | POA: Diagnosis not present

## 2014-12-11 DIAGNOSIS — G309 Alzheimer's disease, unspecified: Secondary | ICD-10-CM | POA: Diagnosis not present

## 2014-12-11 DIAGNOSIS — F0281 Dementia in other diseases classified elsewhere with behavioral disturbance: Secondary | ICD-10-CM | POA: Diagnosis not present

## 2014-12-11 DIAGNOSIS — R32 Unspecified urinary incontinence: Secondary | ICD-10-CM | POA: Diagnosis not present

## 2014-12-11 DIAGNOSIS — M13869 Other specified arthritis, unspecified knee: Secondary | ICD-10-CM | POA: Diagnosis not present

## 2014-12-11 NOTE — Telephone Encounter (Signed)
French Anaracy with [facility] called and needed verbal orders to ReCert patient for PT/OT. Given Verbal.

## 2014-12-18 DIAGNOSIS — E119 Type 2 diabetes mellitus without complications: Secondary | ICD-10-CM | POA: Diagnosis not present

## 2014-12-18 DIAGNOSIS — G8929 Other chronic pain: Secondary | ICD-10-CM | POA: Diagnosis not present

## 2014-12-18 DIAGNOSIS — F0281 Dementia in other diseases classified elsewhere with behavioral disturbance: Secondary | ICD-10-CM | POA: Diagnosis not present

## 2014-12-18 DIAGNOSIS — I10 Essential (primary) hypertension: Secondary | ICD-10-CM | POA: Diagnosis not present

## 2014-12-18 DIAGNOSIS — G309 Alzheimer's disease, unspecified: Secondary | ICD-10-CM | POA: Diagnosis not present

## 2014-12-18 DIAGNOSIS — M13869 Other specified arthritis, unspecified knee: Secondary | ICD-10-CM | POA: Diagnosis not present

## 2014-12-18 DIAGNOSIS — Z794 Long term (current) use of insulin: Secondary | ICD-10-CM | POA: Diagnosis not present

## 2014-12-18 DIAGNOSIS — R32 Unspecified urinary incontinence: Secondary | ICD-10-CM | POA: Diagnosis not present

## 2014-12-19 ENCOUNTER — Telehealth: Payer: Self-pay | Admitting: *Deleted

## 2014-12-19 NOTE — Telephone Encounter (Signed)
Pt states [employer] will not allow her to schedule transportation until our office confirms she was present for her lst 2 office visits.  I called [employer] and confirmed pt was present 08/03/2014, and 10/27/2014.  Pamala Hurry - [employer] states have pt call 01/09/15 to set up appt for 01/26/2015.

## 2014-12-19 NOTE — Telephone Encounter (Signed)
Pt states the transportation company she uses won't let her use their transport, unless they have confirmation she was seen in our office on the last appt date.

## 2014-12-21 DIAGNOSIS — R32 Unspecified urinary incontinence: Secondary | ICD-10-CM | POA: Diagnosis not present

## 2014-12-21 DIAGNOSIS — G309 Alzheimer's disease, unspecified: Secondary | ICD-10-CM | POA: Diagnosis not present

## 2014-12-21 DIAGNOSIS — F0281 Dementia in other diseases classified elsewhere with behavioral disturbance: Secondary | ICD-10-CM | POA: Diagnosis not present

## 2014-12-21 DIAGNOSIS — E119 Type 2 diabetes mellitus without complications: Secondary | ICD-10-CM | POA: Diagnosis not present

## 2014-12-22 ENCOUNTER — Ambulatory Visit (INDEPENDENT_AMBULATORY_CARE_PROVIDER_SITE_OTHER): Payer: Medicare Other | Admitting: Internal Medicine

## 2014-12-22 ENCOUNTER — Encounter: Payer: Self-pay | Admitting: Internal Medicine

## 2014-12-22 VITALS — BP 126/84 | HR 77 | Temp 97.6°F | Ht 59.0 in | Wt 186.8 lb

## 2014-12-22 DIAGNOSIS — K868 Other specified diseases of pancreas: Secondary | ICD-10-CM | POA: Diagnosis not present

## 2014-12-22 DIAGNOSIS — R197 Diarrhea, unspecified: Secondary | ICD-10-CM | POA: Diagnosis not present

## 2014-12-22 DIAGNOSIS — E119 Type 2 diabetes mellitus without complications: Secondary | ICD-10-CM | POA: Diagnosis not present

## 2014-12-22 DIAGNOSIS — K5732 Diverticulitis of large intestine without perforation or abscess without bleeding: Secondary | ICD-10-CM

## 2014-12-22 DIAGNOSIS — F0150 Vascular dementia without behavioral disturbance: Secondary | ICD-10-CM

## 2014-12-22 DIAGNOSIS — K8689 Other specified diseases of pancreas: Secondary | ICD-10-CM

## 2014-12-22 MED ORDER — CIPROFLOXACIN HCL 500 MG PO TABS
500.0000 mg | ORAL_TABLET | Freq: Two times a day (BID) | ORAL | Status: DC
Start: 2014-12-22 — End: 2015-03-12

## 2014-12-22 MED ORDER — METRONIDAZOLE 500 MG PO TABS: 500.0000 mg | ORAL_TABLET | Freq: Two times a day (BID) | ORAL | Status: DC

## 2014-12-22 NOTE — Progress Notes (Signed)
Patient ID: Amanda BalesMary L Adkins, female   DOB: 10/07/1941, 74 y.o.   MRN: [id number]    Facility  [facility]    Place of Service:   OFFICE   Allergies  Allergen Reactions  . Buspar [Buspirone] Hives  . Percocet [Oxycodone-Acetaminophen]     Hallucination  . Vicodin [Hydrocodone-Acetaminophen]     hallucination    Chief Complaint  Patient presents with  . Acute Visit    Complains of Weakness and Diarrhea with foul odor and mucous for 2 weeks    HPI:  74 yo female seen today for acute visit for above. She reports watery diarrhea approximately 3 times a day with associated abdominal cramping and chills. No fever. No bloody stools. No recent abx use. Hx diverticulosis. She denies any consumption of seeds/nuts  No new CP and has intermittent chronic SOB. (+) weakness. She is eating once daily which is unusual for her. Lethargic with decreased energy level. (+) nausea but this is chronic. No emesis. (+) HA but nothing new.  She does not check her BS at home. No low BS reactions  Medications: Patient's Medications  New Prescriptions   No medications on file  Previous Medications   ACCU-CHEK AVIVA PLUS TEST STRIP    USE AS INSTRUCTED TO TEST BLOOD SUGAR   ACCU-CHEK FASTCLIX LANCETS MISC    CHECK BLOOD SUGAR EVERY DAY   ACETAMINOPHEN (TYLENOL) 500 MG TABLET    Take 2 tablets (1,000 mg total) by mouth 3 (three) times daily. For arthritis pain   ATENOLOL (TENORMIN) 50 MG TABLET    Take 1 tablet (50 mg total) by mouth daily.   ATORVASTATIN (LIPITOR) 10 MG TABLET    TAKE 1 TABLET BY MOUTH EVERY DAY   ATROVENT HFA 17 MCG/ACT INHALER    INHALE 2 PUFFS UP TO 4 TIMES DAILY AS NEEDED TO HELP BREATHING/WHEEZING/COUGH   CALCIPOTRIENE 0.005 % SOLUTION    Apply to scalp daily   CREON 36000 UNITS CPEP CAPSULE    TAKE TAKE 1 CAPSULE BY MOUTH THREE TIMES A DAY BEFORE MEALS   DIAZEPAM (VALIUM) 2 MG TABLET    Take 1 tablet (2 mg total) by mouth every 8 (eight) hours as needed. for anxiety   DICLOFENAC SODIUM  (VOLTAREN) 1 % GEL    APPLY 2 GRAMS TO SHOULDERS AND 4 GRAMS TO LEFT KNEE FOUR TIMES A DAY   DICYCLOMINE (BENTYL) 10 MG CAPSULE    Take 10 mg by mouth 4 (four) times daily.   DONEPEZIL (ARICEPT) 10 MG TABLET    TAKE 1 TABLET BY MOUTH EVERY NIGHT AT BEDTIME   DULOXETINE (CYMBALTA) 30 MG CAPSULE    Take 1 capsule (30 mg total) by mouth daily.   DULOXETINE (CYMBALTA) 30 MG CAPSULE    TAKE 1 CAPSULE BY MOUTH DAILY   ESOMEPRAZOLE (NEXIUM) 40 MG CAPSULE    Take 1 capsule (40 mg total) by mouth daily at 12 noon.   FLUOCINOLONE (SYNALAR) 0.025 % OINTMENT    APPLY TO AFFECTED AREA ON ARMS   FUROSEMIDE (LASIX) 20 MG TABLET    TAKE 1 TABLET BY MOUTH EVERY DAY ** STOP HYDROCHLOROTHIAZIDE   GABAPENTIN (NEURONTIN) 300 MG CAPSULE    TAKE 1 CAPSULE BY MOUTH EVERY DAY FOR NERVE PAIN   METFORMIN (GLUCOPHAGE) 500 MG TABLET    TAKE 1 TABLET BY MOUTH TWICE DAILY WITH A MEAL FOR DIABETES   NAMENDA XR 28 MG CP24 24 HR CAPSULE    TAKE 1 CAPSULE BY MOUTH DAILY  NICOTINE (NICODERM CQ - DOSED IN MG/24 HR) 7 MG/24HR PATCH    APPLY 1 PATCH DAILY AS DIRECTED (CHANGE DAILY)   NON FORMULARY    Formula 1117 cream, 360 grams, with 5 refills.   Use as directed for knee pain   NYSTATIN CREAM (MYCOSTATIN)    APPLY TO AFFECTED AREA TWICE DAILY   POTASSIUM CHLORIDE SA (K-DUR,KLOR-CON) 20 MEQ TABLET    TAKE 1 TABLET BY MOUTH TWICE DAILY   PROAIR HFA 108 (90 BASE) MCG/ACT INHALER    INHALE 2 PUFFS UP TO 4 TIMES DAILY AS NEEDED TO HELP BREATHING/WHEEZING/COUGH   RAMIPRIL (ALTACE) 10 MG CAPSULE    TAKE 1 CAPSULE BY MOUTH EVERY DAY FOR BLOOD PRESSURE   RISPERIDONE (RISPERDAL) 0.25 MG TABLET    TAKE 1 TABLET BY MOUTH EVERY NIGHT AT BEDTIME   SUCRALFATE (CARAFATE) 1 G TABLET    TAKE 1 TABLET BY MOUTH BEFORE MEALS AND AT BEDTIME TO PROTECT STOMACH   TRAZODONE (DESYREL) 150 MG TABLET    TAKE 1 TABLET BY MOUTH NIGHTLY TO HELP WITH NERVES AND REST  Modified Medications   No medications on file  Discontinued Medications   No medications on  file     Review of Systems  As above. All other systems reviewed are negative.  Filed Vitals:   12/22/14 1540  BP: 126/84  Pulse: 77  Temp: 97.6 F (36.4 C)  TempSrc: Oral  Height: 4\' 11"  (1.499 m)  Weight: 186 lb 12.8 oz (84.732 kg)   Body mass index is 37.71 kg/(m^2).  Physical Exam CONSTITUTIONAL: Looks ill in NAD. Awake, alert and oriented x 3 HEENT: PERRLA. No scleral icterus. Oropharynx clear and without exudate. MMdry NECK: Supple. Nontender. No palpable cervical or supraclavicular lymph nodes.  CVS: Regular rate without murmur, gallop or rub. LUNGS: CTA b/l no wheezing, rales or rhonchi. ABDOMEN: Bowel sounds present x 4 and hyperactive. Soft, nondistended. No palpable mass or bruit. LLQ TTP but no r/g/r EXTREMITIES: No edema b/l.     Labs reviewed: Office Visit on 09/28/2014  Component Date Value Ref Range Status  . WBC 09/28/2014 11.2* 3.4 - 10.8 x10E3/uL Final  . RBC 09/28/2014 4.16  3.77 - 5.28 x10E6/uL Final  . Hemoglobin 09/28/2014 12.1  11.1 - 15.9 g/dL Final  . HCT 62/95/2841 37.0  34.0 - 46.6 % Final  . MCV 09/28/2014 89  79 - 97 fL Final  . MCH 09/28/2014 29.1  26.6 - 33.0 pg Final  . MCHC 09/28/2014 32.7  31.5 - 35.7 g/dL Final  . RDW 32/44/0102 16.1* 12.3 - 15.4 % Final  . Platelets 09/28/2014 195  150 - 379 x10E3/uL Final  . Neutrophils Relative % 09/28/2014 37   Final  . Lymphs 09/28/2014 57   Final  . Monocytes 09/28/2014 4   Final  . Eos 09/28/2014 2   Final  . Basos 09/28/2014 0   Final  . Neutrophils Absolute 09/28/2014 4.1  1.4 - 7.0 x10E3/uL Final  . Lymphocytes Absolute 09/28/2014 6.3* 0.7 - 3.1 x10E3/uL Final  . Monocytes Absolute 09/28/2014 0.5  0.1 - 0.9 x10E3/uL Final  . Eosinophils Absolute 09/28/2014 0.3  0.0 - 0.4 x10E3/uL Final  . Basophils Absolute 09/28/2014 0.0  0.0 - 0.2 x10E3/uL Final  . Immature Granulocytes 09/28/2014 0   Final  . Immature Grans (Abs) 09/28/2014 0.0  0.0 - 0.1 x10E3/uL Final  . Glucose 09/28/2014 79  65  - 99 mg/dL Final  . BUN 72/53/6644 14  8 - 27  mg/dL Final  . Creatinine, Ser 09/28/2014 0.68  0.57 - 1.00 mg/dL Final  . GFR calc non Af Amer 09/28/2014 86  >59 mL/min/1.73 Final  . GFR calc Af Amer 09/28/2014 100  >59 mL/min/1.73 Final  . BUN/Creatinine Ratio 09/28/2014 21  11 - 26 Final  . Sodium 09/28/2014 140  134 - 144 mmol/L Final  . Potassium 09/28/2014 4.1  3.5 - 5.2 mmol/L Final  . Chloride 09/28/2014 99  97 - 108 mmol/L Final  . CO2 09/28/2014 26  18 - 29 mmol/L Final  . Calcium 09/28/2014 9.6  8.7 - 10.3 mg/dL Final  . Total Protein 09/28/2014 7.0  6.0 - 8.5 g/dL Final  . Albumin 16/08/9603 4.2  3.5 - 4.8 g/dL Final  . Globulin, Total 09/28/2014 2.8  1.5 - 4.5 g/dL Final  . Albumin/Globulin Ratio 09/28/2014 1.5  1.1 - 2.5 Final  . Total Bilirubin 09/28/2014 0.4  0.0 - 1.2 mg/dL Final  . Alkaline Phosphatase 09/28/2014 93  39 - 117 IU/L Final  . AST 09/28/2014 10  0 - 40 IU/L Final  . ALT 09/28/2014 8  0 - 32 IU/L Final  . Hgb A1c MFr Bld 09/28/2014 6.1* 4.8 - 5.6 % Final   Comment:          Pre-diabetes: 5.7 - 6.4          Diabetes: >6.4          Glycemic control for adults with diabetes: <7.0   . Est. average glucose Bld gHb Est-m* 09/28/2014 128   Final  . Cholesterol, Total 09/28/2014 144  100 - 199 mg/dL Final  . Triglycerides 09/28/2014 128  0 - 149 mg/dL Final  . HDL 54/07/8118 57  >39 mg/dL Final   Comment: According to ATP-III Guidelines, HDL-C >59 mg/dL is considered a negative risk factor for CHD.   Marland Kitchen VLDL Cholesterol Cal 09/28/2014 26  5 - 40 mg/dL Final  . LDL Calculated 09/28/2014 61  0 - 99 mg/dL Final  . Chol/HDL Ratio 09/28/2014 2.5  0.0 - 4.4 ratio units Final   Comment:                                   T. Chol/HDL Ratio                                             Men  Women                               1/2 Avg.Risk  3.4    3.3                                   Avg.Risk  5.0    4.4                                2X Avg.Risk  9.6    7.1                                 3X Avg.Risk 23.4   11.0  Assessment/Plan   ICD-9-CM ICD-10-CM   1. Diverticulitis of colon without hemorrhage 562.11 K57.32 CBC with Differential     CMP     ciprofloxacin (CIPRO) 500 MG tablet     metroNIDAZOLE (FLAGYL) 500 MG tablet  2. Pancreatic insufficiency 577.8 K86.8   3. Diabetes mellitus type II, controlled 250.00 E11.9   4. Vascular dementia, without behavioral disturbance 290.40 F01.50   5. Diarrhea 787.91 R19.7     --clear liquid/bland diet and advance as tolerated  --push fluids and rest  --take all abx as Rx  --will call with lab results  --keep appt with Dr Renato Gails as scheduled  --RTO if sx's persist or worsen  Raima Geathers S. Ancil Linsey  [facility] [address] [place], [place] [address] [phone] Office (Wednesdays and Fridays 8 AM - 5 PM) [phone] Cell (Monday-Friday 8 AM - 5 PM)

## 2014-12-22 NOTE — Patient Instructions (Addendum)
Push fluids and rest.  Clear liquid/Bland diet (avoid diary, fatty/greasy foods, high sugary foods) and advance as tolerated  Avoid foods with nuts/seeds (including strawberries)  Keep appt with Dr Renato Gailseed next week  Return to office if symptoms do not improve

## 2014-12-23 LAB — COMPREHENSIVE METABOLIC PANEL
ALT: 15 IU/L (ref 0–32)
AST: 16 IU/L (ref 0–40)
Albumin/Globulin Ratio: 1.4 (ref 1.1–2.5)
Albumin: 4 g/dL (ref 3.5–4.8)
Alkaline Phosphatase: 91 IU/L (ref 39–117)
BUN/Creatinine Ratio: 29 — ABNORMAL HIGH (ref 11–26)
BUN: 18 mg/dL (ref 8–27)
Bilirubin Total: 0.2 mg/dL (ref 0.0–1.2)
CO2: 24 mmol/L (ref 18–29)
Calcium: 9.3 mg/dL (ref 8.7–10.3)
Chloride: 103 mmol/L (ref 97–108)
Creatinine, Ser: 0.63 mg/dL (ref 0.57–1.00)
GFR calc Af Amer: 102 mL/min/{1.73_m2} (ref >59)
GFR calc non Af Amer: 89 mL/min/{1.73_m2} (ref >59)
Globulin, Total: 2.9 g/dL (ref 1.5–4.5)
Glucose: 91 mg/dL (ref 65–99)
POTASSIUM: 4.1 mmol/L (ref 3.5–5.2)
SODIUM: 142 mmol/L (ref 134–144)
Total Protein: 6.9 g/dL (ref 6.0–8.5)

## 2014-12-23 LAB — CBC WITH DIFFERENTIAL/PLATELET
BASOS ABS: 0 10*3/uL (ref 0.0–0.2)
Basos: 0 %
Eos: 3 %
Eosinophils Absolute: 0.3 10*3/uL (ref 0.0–0.4)
HCT: 36.6 % (ref 34.0–46.6)
HEMOGLOBIN: 12.2 g/dL (ref 11.1–15.9)
IMMATURE GRANS (ABS): 0 10*3/uL (ref 0.0–0.1)
Immature Granulocytes: 0 %
LYMPHS ABS: 5.7 10*3/uL — AB (ref 0.7–3.1)
Lymphs: 56 %
MCH: 30.1 pg (ref 26.6–33.0)
MCHC: 33.3 g/dL (ref 31.5–35.7)
MCV: 90 fL (ref 79–97)
MONOCYTES: 6 %
Monocytes Absolute: 0.6 10*3/uL (ref 0.1–0.9)
NEUTROS PCT: 35 %
Neutrophils Absolute: 3.5 10*3/uL (ref 1.4–7.0)
PLATELETS: 184 10*3/uL (ref 150–379)
RBC: 4.05 x10E6/uL (ref 3.77–5.28)
RDW: 15 % (ref 12.3–15.4)
WBC: 10 10*3/uL (ref 3.4–10.8)

## 2014-12-28 ENCOUNTER — Ambulatory Visit: Payer: Medicare Other | Admitting: Internal Medicine

## 2015-01-01 DIAGNOSIS — G8929 Other chronic pain: Secondary | ICD-10-CM | POA: Diagnosis not present

## 2015-01-01 DIAGNOSIS — G309 Alzheimer's disease, unspecified: Secondary | ICD-10-CM | POA: Diagnosis not present

## 2015-01-01 DIAGNOSIS — E119 Type 2 diabetes mellitus without complications: Secondary | ICD-10-CM | POA: Diagnosis not present

## 2015-01-01 DIAGNOSIS — F0281 Dementia in other diseases classified elsewhere with behavioral disturbance: Secondary | ICD-10-CM | POA: Diagnosis not present

## 2015-01-01 DIAGNOSIS — I10 Essential (primary) hypertension: Secondary | ICD-10-CM | POA: Diagnosis not present

## 2015-01-01 DIAGNOSIS — Z794 Long term (current) use of insulin: Secondary | ICD-10-CM | POA: Diagnosis not present

## 2015-01-01 DIAGNOSIS — M13869 Other specified arthritis, unspecified knee: Secondary | ICD-10-CM | POA: Diagnosis not present

## 2015-01-01 DIAGNOSIS — R32 Unspecified urinary incontinence: Secondary | ICD-10-CM | POA: Diagnosis not present

## 2015-01-02 ENCOUNTER — Telehealth: Payer: Self-pay | Admitting: *Deleted

## 2015-01-02 NOTE — Telephone Encounter (Signed)
French Anaracy with [facility] called and stated that she needed an updated A1C result. No updated. Patient canceled her 3 month OV and didn't reschedule. Nurse will have patient call to reschedule her appointment. Agreed.

## 2015-01-15 DIAGNOSIS — I10 Essential (primary) hypertension: Secondary | ICD-10-CM | POA: Diagnosis not present

## 2015-01-15 DIAGNOSIS — E119 Type 2 diabetes mellitus without complications: Secondary | ICD-10-CM | POA: Diagnosis not present

## 2015-01-15 DIAGNOSIS — Z794 Long term (current) use of insulin: Secondary | ICD-10-CM | POA: Diagnosis not present

## 2015-01-15 DIAGNOSIS — G309 Alzheimer's disease, unspecified: Secondary | ICD-10-CM | POA: Diagnosis not present

## 2015-01-15 DIAGNOSIS — M13869 Other specified arthritis, unspecified knee: Secondary | ICD-10-CM | POA: Diagnosis not present

## 2015-01-15 DIAGNOSIS — F0281 Dementia in other diseases classified elsewhere with behavioral disturbance: Secondary | ICD-10-CM | POA: Diagnosis not present

## 2015-01-15 DIAGNOSIS — R32 Unspecified urinary incontinence: Secondary | ICD-10-CM | POA: Diagnosis not present

## 2015-01-15 DIAGNOSIS — G8929 Other chronic pain: Secondary | ICD-10-CM | POA: Diagnosis not present

## 2015-01-17 ENCOUNTER — Other Ambulatory Visit: Payer: Self-pay | Admitting: Internal Medicine

## 2015-01-18 ENCOUNTER — Other Ambulatory Visit: Payer: Self-pay | Admitting: Nurse Practitioner

## 2015-01-26 ENCOUNTER — Ambulatory Visit: Payer: Medicare Other | Admitting: Podiatrist

## 2015-01-26 ENCOUNTER — Ambulatory Visit (INDEPENDENT_AMBULATORY_CARE_PROVIDER_SITE_OTHER): Payer: Medicare Other | Admitting: Podiatrist

## 2015-01-26 ENCOUNTER — Encounter: Payer: Self-pay | Admitting: Podiatrist

## 2015-01-26 DIAGNOSIS — B351 Tinea unguium: Secondary | ICD-10-CM | POA: Diagnosis not present

## 2015-01-26 DIAGNOSIS — M79676 Pain in unspecified toe(s): Secondary | ICD-10-CM

## 2015-01-26 NOTE — Progress Notes (Signed)
HPI: Patient presents today for follow up of foot and nail care. Requests the toenails be cut back as short as possible today. She comes in today wearing no shoes. Objective: Patients chart is reviewed. Vascular status reveals pedal pulses noted at 2 out of 4 dp and pt bilateral . Neurological sensation is Decreased to [facility] monofilament bilateral. Patients nails x 6 are thickened, discolored, distrophic, friable and brittle with yellow-brown discoloration. Patient subjectively relates they are painful with shoes and with ambulation of bilateral feet.  Assessment: Symptomatic onychomycosis x 6  Plan: Discussed treatment options and alternatives. The symptomatic toenails were debrided through manual an mechanical means without complication. Return appointment recommended at routine intervals of 3 months as a courtesy postoperative sandals are dispensed to that she may walk back to the bus stop in shoes.

## 2015-01-29 DIAGNOSIS — Z794 Long term (current) use of insulin: Secondary | ICD-10-CM | POA: Diagnosis not present

## 2015-01-29 DIAGNOSIS — G309 Alzheimer's disease, unspecified: Secondary | ICD-10-CM | POA: Diagnosis not present

## 2015-01-29 DIAGNOSIS — E119 Type 2 diabetes mellitus without complications: Secondary | ICD-10-CM | POA: Diagnosis not present

## 2015-01-29 DIAGNOSIS — I10 Essential (primary) hypertension: Secondary | ICD-10-CM | POA: Diagnosis not present

## 2015-01-29 DIAGNOSIS — F0281 Dementia in other diseases classified elsewhere with behavioral disturbance: Secondary | ICD-10-CM | POA: Diagnosis not present

## 2015-01-29 DIAGNOSIS — M13869 Other specified arthritis, unspecified knee: Secondary | ICD-10-CM | POA: Diagnosis not present

## 2015-01-29 DIAGNOSIS — G8929 Other chronic pain: Secondary | ICD-10-CM | POA: Diagnosis not present

## 2015-01-29 DIAGNOSIS — R32 Unspecified urinary incontinence: Secondary | ICD-10-CM | POA: Diagnosis not present

## 2015-02-12 DIAGNOSIS — Z794 Long term (current) use of insulin: Secondary | ICD-10-CM | POA: Diagnosis not present

## 2015-02-12 DIAGNOSIS — G8929 Other chronic pain: Secondary | ICD-10-CM | POA: Diagnosis not present

## 2015-02-12 DIAGNOSIS — I10 Essential (primary) hypertension: Secondary | ICD-10-CM | POA: Diagnosis not present

## 2015-02-12 DIAGNOSIS — M13869 Other specified arthritis, unspecified knee: Secondary | ICD-10-CM | POA: Diagnosis not present

## 2015-02-12 DIAGNOSIS — E119 Type 2 diabetes mellitus without complications: Secondary | ICD-10-CM | POA: Diagnosis not present

## 2015-02-12 DIAGNOSIS — R32 Unspecified urinary incontinence: Secondary | ICD-10-CM | POA: Diagnosis not present

## 2015-02-12 DIAGNOSIS — F0281 Dementia in other diseases classified elsewhere with behavioral disturbance: Secondary | ICD-10-CM | POA: Diagnosis not present

## 2015-02-12 DIAGNOSIS — G309 Alzheimer's disease, unspecified: Secondary | ICD-10-CM | POA: Diagnosis not present

## 2015-02-14 ENCOUNTER — Other Ambulatory Visit: Payer: Self-pay | Admitting: Internal Medicine

## 2015-02-14 DIAGNOSIS — K8689 Other specified diseases of pancreas: Secondary | ICD-10-CM

## 2015-02-14 DIAGNOSIS — E1110 Type 2 diabetes mellitus with ketoacidosis without coma: Secondary | ICD-10-CM

## 2015-02-14 DIAGNOSIS — I10 Essential (primary) hypertension: Secondary | ICD-10-CM

## 2015-02-16 DIAGNOSIS — F0281 Dementia in other diseases classified elsewhere with behavioral disturbance: Secondary | ICD-10-CM | POA: Diagnosis not present

## 2015-02-16 DIAGNOSIS — G8929 Other chronic pain: Secondary | ICD-10-CM | POA: Diagnosis not present

## 2015-02-16 DIAGNOSIS — M13869 Other specified arthritis, unspecified knee: Secondary | ICD-10-CM | POA: Diagnosis not present

## 2015-02-16 DIAGNOSIS — I10 Essential (primary) hypertension: Secondary | ICD-10-CM | POA: Diagnosis not present

## 2015-02-16 DIAGNOSIS — Z794 Long term (current) use of insulin: Secondary | ICD-10-CM | POA: Diagnosis not present

## 2015-02-16 DIAGNOSIS — G309 Alzheimer's disease, unspecified: Secondary | ICD-10-CM | POA: Diagnosis not present

## 2015-02-16 DIAGNOSIS — E119 Type 2 diabetes mellitus without complications: Secondary | ICD-10-CM | POA: Diagnosis not present

## 2015-02-16 DIAGNOSIS — R32 Unspecified urinary incontinence: Secondary | ICD-10-CM | POA: Diagnosis not present

## 2015-02-19 DIAGNOSIS — M13869 Other specified arthritis, unspecified knee: Secondary | ICD-10-CM | POA: Diagnosis not present

## 2015-02-19 DIAGNOSIS — Z794 Long term (current) use of insulin: Secondary | ICD-10-CM | POA: Diagnosis not present

## 2015-02-19 DIAGNOSIS — R32 Unspecified urinary incontinence: Secondary | ICD-10-CM | POA: Diagnosis not present

## 2015-02-19 DIAGNOSIS — G8929 Other chronic pain: Secondary | ICD-10-CM | POA: Diagnosis not present

## 2015-02-19 DIAGNOSIS — G309 Alzheimer's disease, unspecified: Secondary | ICD-10-CM | POA: Diagnosis not present

## 2015-02-19 DIAGNOSIS — F0281 Dementia in other diseases classified elsewhere with behavioral disturbance: Secondary | ICD-10-CM | POA: Diagnosis not present

## 2015-02-19 DIAGNOSIS — I10 Essential (primary) hypertension: Secondary | ICD-10-CM | POA: Diagnosis not present

## 2015-02-19 DIAGNOSIS — E119 Type 2 diabetes mellitus without complications: Secondary | ICD-10-CM | POA: Diagnosis not present

## 2015-02-23 ENCOUNTER — Ambulatory Visit: Payer: Medicare Other | Admitting: Podiatrist

## 2015-03-05 ENCOUNTER — Encounter: Payer: Self-pay | Admitting: Internal Medicine

## 2015-03-05 DIAGNOSIS — Z794 Long term (current) use of insulin: Secondary | ICD-10-CM | POA: Diagnosis not present

## 2015-03-05 DIAGNOSIS — I10 Essential (primary) hypertension: Secondary | ICD-10-CM | POA: Diagnosis not present

## 2015-03-05 DIAGNOSIS — M13869 Other specified arthritis, unspecified knee: Secondary | ICD-10-CM | POA: Diagnosis not present

## 2015-03-05 DIAGNOSIS — E119 Type 2 diabetes mellitus without complications: Secondary | ICD-10-CM | POA: Diagnosis not present

## 2015-03-05 DIAGNOSIS — G309 Alzheimer's disease, unspecified: Secondary | ICD-10-CM | POA: Diagnosis not present

## 2015-03-05 DIAGNOSIS — F0281 Dementia in other diseases classified elsewhere with behavioral disturbance: Secondary | ICD-10-CM | POA: Diagnosis not present

## 2015-03-05 DIAGNOSIS — R32 Unspecified urinary incontinence: Secondary | ICD-10-CM | POA: Diagnosis not present

## 2015-03-05 DIAGNOSIS — G8929 Other chronic pain: Secondary | ICD-10-CM | POA: Diagnosis not present

## 2015-03-12 ENCOUNTER — Ambulatory Visit (INDEPENDENT_AMBULATORY_CARE_PROVIDER_SITE_OTHER): Payer: Medicare Other | Admitting: Internal Medicine

## 2015-03-12 ENCOUNTER — Encounter: Payer: Self-pay | Admitting: Internal Medicine

## 2015-03-12 VITALS — BP 102/68 | HR 69 | Temp 98.0°F | Resp 20 | Ht 59.0 in | Wt 190.8 lb

## 2015-03-12 DIAGNOSIS — I10 Essential (primary) hypertension: Secondary | ICD-10-CM

## 2015-03-12 DIAGNOSIS — Z23 Encounter for immunization: Secondary | ICD-10-CM | POA: Diagnosis not present

## 2015-03-12 DIAGNOSIS — Z1239 Encounter for other screening for malignant neoplasm of breast: Secondary | ICD-10-CM | POA: Diagnosis not present

## 2015-03-12 DIAGNOSIS — F22 Delusional disorders: Secondary | ICD-10-CM

## 2015-03-12 DIAGNOSIS — M170 Bilateral primary osteoarthritis of knee: Secondary | ICD-10-CM

## 2015-03-12 DIAGNOSIS — K868 Other specified diseases of pancreas: Secondary | ICD-10-CM | POA: Diagnosis not present

## 2015-03-12 DIAGNOSIS — Z780 Asymptomatic menopausal state: Secondary | ICD-10-CM | POA: Diagnosis not present

## 2015-03-12 DIAGNOSIS — Z5181 Encounter for therapeutic drug level monitoring: Secondary | ICD-10-CM

## 2015-03-12 DIAGNOSIS — E119 Type 2 diabetes mellitus without complications: Secondary | ICD-10-CM

## 2015-03-12 DIAGNOSIS — K8689 Other specified diseases of pancreas: Secondary | ICD-10-CM

## 2015-03-12 DIAGNOSIS — F0392 Unspecified dementia, unspecified severity, with psychotic disturbance: Secondary | ICD-10-CM

## 2015-03-12 DIAGNOSIS — F0390 Unspecified dementia without behavioral disturbance: Secondary | ICD-10-CM | POA: Diagnosis not present
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
## 2015-03-12 NOTE — Progress Notes (Signed)
Did not pass clock test. 

## 2015-03-13 LAB — COMPREHENSIVE METABOLIC PANEL
ALT: 14 IU/L (ref 0–32)
AST: 18 IU/L (ref 0–40)
Albumin/Globulin Ratio: 1.2 (ref 1.1–2.5)
Albumin: 4.2 g/dL (ref 3.5–4.8)
Alkaline Phosphatase: 106 IU/L (ref 39–117)
BUN/Creatinine Ratio: 22 (ref 11–26)
BUN: 19 mg/dL (ref 8–27)
Bilirubin Total: <0.2 mg/dL (ref 0.0–1.2)
CO2: 22 mmol/L (ref 18–29)
Calcium: 10 mg/dL (ref 8.7–10.3)
Chloride: 98 mmol/L (ref 97–108)
Creatinine, Ser: 0.86 mg/dL (ref 0.57–1.00)
GFR calc Af Amer: 77 mL/min/{1.73_m2} (ref >59)
GFR calc non Af Amer: 67 mL/min/{1.73_m2} (ref >59)
Globulin, Total: 3.4 g/dL (ref 1.5–4.5)
Glucose: 87 mg/dL (ref 65–99)
Potassium: 4.5 mmol/L (ref 3.5–5.2)
Sodium: 139 mmol/L (ref 134–144)
Total Protein: 7.6 g/dL (ref 6.0–8.5)

## 2015-03-13 LAB — CBC WITH DIFFERENTIAL/PLATELET
Basophils Absolute: 0 10*3/uL (ref 0.0–0.2)
Basos: 0 %
EOS (ABSOLUTE): 0.2 10*3/uL (ref 0.0–0.4)
Eos: 2 %
Hematocrit: 38.7 % (ref 34.0–46.6)
Hemoglobin: 12.7 g/dL (ref 11.1–15.9)
Immature Grans (Abs): 0 10*3/uL (ref 0.0–0.1)
Immature Granulocytes: 0 %
Lymphocytes Absolute: 6.8 10*3/uL — ABNORMAL HIGH (ref 0.7–3.1)
Lymphs: 58 %
MCH: 28.8 pg (ref 26.6–33.0)
MCHC: 32.8 g/dL (ref 31.5–35.7)
MCV: 88 fL (ref 79–97)
Monocytes Absolute: 0.6 10*3/uL (ref 0.1–0.9)
Monocytes: 5 %
Neutrophils Absolute: 4.1 10*3/uL (ref 1.4–7.0)
Neutrophils: 35 %
Platelets: 229 10*3/uL (ref 150–379)
RBC: 4.41 x10E6/uL (ref 3.77–5.28)
RDW: 15 % (ref 12.3–15.4)
WBC: 11.7 10*3/uL — ABNORMAL HIGH (ref 3.4–10.8)

## 2015-03-13 LAB — HEMOGLOBIN A1C
Est. average glucose Bld gHb Est-mCnc: 143 mg/dL
Hgb A1c MFr Bld: 6.6 % — ABNORMAL HIGH (ref 4.8–5.6)

## 2015-03-14 ENCOUNTER — Other Ambulatory Visit: Payer: Self-pay | Admitting: *Deleted

## 2015-03-14 ENCOUNTER — Other Ambulatory Visit: Payer: Self-pay | Admitting: Internal Medicine

## 2015-03-14 DIAGNOSIS — D72829 Elevated white blood cell count, unspecified: Secondary | ICD-10-CM

## 2015-03-19 DIAGNOSIS — E119 Type 2 diabetes mellitus without complications: Secondary | ICD-10-CM | POA: Diagnosis not present

## 2015-03-19 DIAGNOSIS — I10 Essential (primary) hypertension: Secondary | ICD-10-CM | POA: Diagnosis not present

## 2015-03-19 DIAGNOSIS — Z794 Long term (current) use of insulin: Secondary | ICD-10-CM | POA: Diagnosis not present

## 2015-03-19 DIAGNOSIS — R32 Unspecified urinary incontinence: Secondary | ICD-10-CM | POA: Diagnosis not present

## 2015-03-19 DIAGNOSIS — G309 Alzheimer's disease, unspecified: Secondary | ICD-10-CM | POA: Diagnosis not present

## 2015-03-19 DIAGNOSIS — G8929 Other chronic pain: Secondary | ICD-10-CM | POA: Diagnosis not present

## 2015-03-19 DIAGNOSIS — M13869 Other specified arthritis, unspecified knee: Secondary | ICD-10-CM | POA: Diagnosis not present

## 2015-03-19 DIAGNOSIS — F0281 Dementia in other diseases classified elsewhere with behavioral disturbance: Secondary | ICD-10-CM | POA: Diagnosis not present

## 2015-04-02 DIAGNOSIS — R32 Unspecified urinary incontinence: Secondary | ICD-10-CM | POA: Diagnosis not present

## 2015-04-02 DIAGNOSIS — E119 Type 2 diabetes mellitus without complications: Secondary | ICD-10-CM | POA: Diagnosis not present

## 2015-04-02 DIAGNOSIS — G8929 Other chronic pain: Secondary | ICD-10-CM | POA: Diagnosis not present

## 2015-04-02 DIAGNOSIS — I10 Essential (primary) hypertension: Secondary | ICD-10-CM | POA: Diagnosis not present

## 2015-04-02 DIAGNOSIS — G309 Alzheimer's disease, unspecified: Secondary | ICD-10-CM | POA: Diagnosis not present

## 2015-04-02 DIAGNOSIS — F0281 Dementia in other diseases classified elsewhere with behavioral disturbance: Secondary | ICD-10-CM | POA: Diagnosis not present

## 2015-04-02 DIAGNOSIS — Z794 Long term (current) use of insulin: Secondary | ICD-10-CM | POA: Diagnosis not present

## 2015-04-02 DIAGNOSIS — M13869 Other specified arthritis, unspecified knee: Secondary | ICD-10-CM | POA: Diagnosis not present

## 2015-04-04 ENCOUNTER — Ambulatory Visit: Payer: Self-pay

## 2015-04-04 ENCOUNTER — Other Ambulatory Visit: Payer: Self-pay

## 2015-04-11 ENCOUNTER — Other Ambulatory Visit: Payer: Self-pay | Admitting: Internal Medicine

## 2015-04-16 DIAGNOSIS — Z794 Long term (current) use of insulin: Secondary | ICD-10-CM | POA: Diagnosis not present

## 2015-04-16 DIAGNOSIS — E119 Type 2 diabetes mellitus without complications: Secondary | ICD-10-CM | POA: Diagnosis not present

## 2015-04-16 DIAGNOSIS — M13869 Other specified arthritis, unspecified knee: Secondary | ICD-10-CM | POA: Diagnosis not present

## 2015-04-16 DIAGNOSIS — F0281 Dementia in other diseases classified elsewhere with behavioral disturbance: Secondary | ICD-10-CM | POA: Diagnosis not present

## 2015-04-16 DIAGNOSIS — R32 Unspecified urinary incontinence: Secondary | ICD-10-CM | POA: Diagnosis not present

## 2015-04-16 DIAGNOSIS — G8929 Other chronic pain: Secondary | ICD-10-CM | POA: Diagnosis not present

## 2015-04-16 DIAGNOSIS — G309 Alzheimer's disease, unspecified: Secondary | ICD-10-CM | POA: Diagnosis not present

## 2015-04-16 DIAGNOSIS — I10 Essential (primary) hypertension: Secondary | ICD-10-CM | POA: Diagnosis not present

## 2015-04-20 DIAGNOSIS — F0281 Dementia in other diseases classified elsewhere with behavioral disturbance: Secondary | ICD-10-CM | POA: Diagnosis not present

## 2015-04-20 DIAGNOSIS — R32 Unspecified urinary incontinence: Secondary | ICD-10-CM | POA: Diagnosis not present

## 2015-04-20 DIAGNOSIS — G309 Alzheimer's disease, unspecified: Secondary | ICD-10-CM | POA: Diagnosis not present

## 2015-04-20 DIAGNOSIS — E119 Type 2 diabetes mellitus without complications: Secondary | ICD-10-CM | POA: Diagnosis not present

## 2015-04-23 DIAGNOSIS — E119 Type 2 diabetes mellitus without complications: Secondary | ICD-10-CM | POA: Diagnosis not present

## 2015-04-23 DIAGNOSIS — G309 Alzheimer's disease, unspecified: Secondary | ICD-10-CM | POA: Diagnosis not present

## 2015-04-23 DIAGNOSIS — R32 Unspecified urinary incontinence: Secondary | ICD-10-CM | POA: Diagnosis not present

## 2015-04-23 DIAGNOSIS — F0281 Dementia in other diseases classified elsewhere with behavioral disturbance: Secondary | ICD-10-CM | POA: Diagnosis not present

## 2015-04-23 DIAGNOSIS — G8929 Other chronic pain: Secondary | ICD-10-CM | POA: Diagnosis not present

## 2015-04-23 DIAGNOSIS — M13869 Other specified arthritis, unspecified knee: Secondary | ICD-10-CM | POA: Diagnosis not present

## 2015-04-23 DIAGNOSIS — I10 Essential (primary) hypertension: Secondary | ICD-10-CM | POA: Diagnosis not present

## 2015-04-23 DIAGNOSIS — Z794 Long term (current) use of insulin: Secondary | ICD-10-CM | POA: Diagnosis not present

## 2015-04-27 ENCOUNTER — Ambulatory Visit: Payer: Medicare Other | Admitting: Podiatry

## 2015-05-07 DIAGNOSIS — E119 Type 2 diabetes mellitus without complications: Secondary | ICD-10-CM | POA: Diagnosis not present

## 2015-05-07 DIAGNOSIS — M13869 Other specified arthritis, unspecified knee: Secondary | ICD-10-CM | POA: Diagnosis not present

## 2015-05-07 DIAGNOSIS — G309 Alzheimer's disease, unspecified: Secondary | ICD-10-CM | POA: Diagnosis not present

## 2015-05-07 DIAGNOSIS — I10 Essential (primary) hypertension: Secondary | ICD-10-CM | POA: Diagnosis not present

## 2015-05-07 DIAGNOSIS — G8929 Other chronic pain: Secondary | ICD-10-CM | POA: Diagnosis not present

## 2015-05-07 DIAGNOSIS — R32 Unspecified urinary incontinence: Secondary | ICD-10-CM | POA: Diagnosis not present

## 2015-05-07 DIAGNOSIS — F0281 Dementia in other diseases classified elsewhere with behavioral disturbance: Secondary | ICD-10-CM | POA: Diagnosis not present

## 2015-05-07 DIAGNOSIS — Z794 Long term (current) use of insulin: Secondary | ICD-10-CM | POA: Diagnosis not present

## 2015-05-09 ENCOUNTER — Other Ambulatory Visit: Payer: Self-pay | Admitting: Internal Medicine

## 2015-05-11 ENCOUNTER — Ambulatory Visit: Payer: Medicare Other | Admitting: Podiatry

## 2015-05-11 ENCOUNTER — Telehealth: Payer: Self-pay | Admitting: *Deleted

## 2015-05-11 NOTE — Telephone Encounter (Signed)
Pt states she's been waiting for the transportation, and it is 115pm and they haven't shown up and she needs an appt to get her toenails trimmed.

## 2015-05-21 DIAGNOSIS — R32 Unspecified urinary incontinence: Secondary | ICD-10-CM | POA: Diagnosis not present

## 2015-05-21 DIAGNOSIS — E119 Type 2 diabetes mellitus without complications: Secondary | ICD-10-CM | POA: Diagnosis not present

## 2015-05-21 DIAGNOSIS — Z794 Long term (current) use of insulin: Secondary | ICD-10-CM | POA: Diagnosis not present

## 2015-05-21 DIAGNOSIS — M13869 Other specified arthritis, unspecified knee: Secondary | ICD-10-CM | POA: Diagnosis not present

## 2015-05-21 DIAGNOSIS — G309 Alzheimer's disease, unspecified: Secondary | ICD-10-CM | POA: Diagnosis not present

## 2015-05-21 DIAGNOSIS — I10 Essential (primary) hypertension: Secondary | ICD-10-CM | POA: Diagnosis not present

## 2015-05-21 DIAGNOSIS — F0281 Dementia in other diseases classified elsewhere with behavioral disturbance: Secondary | ICD-10-CM | POA: Diagnosis not present

## 2015-05-21 DIAGNOSIS — G8929 Other chronic pain: Secondary | ICD-10-CM | POA: Diagnosis not present

## 2015-06-04 ENCOUNTER — Other Ambulatory Visit: Payer: Self-pay | Admitting: *Deleted

## 2015-06-04 DIAGNOSIS — E119 Type 2 diabetes mellitus without complications: Secondary | ICD-10-CM | POA: Diagnosis not present

## 2015-06-04 DIAGNOSIS — R32 Unspecified urinary incontinence: Secondary | ICD-10-CM | POA: Diagnosis not present

## 2015-06-04 DIAGNOSIS — G8929 Other chronic pain: Secondary | ICD-10-CM | POA: Diagnosis not present

## 2015-06-04 DIAGNOSIS — G309 Alzheimer's disease, unspecified: Secondary | ICD-10-CM | POA: Diagnosis not present

## 2015-06-04 DIAGNOSIS — F0281 Dementia in other diseases classified elsewhere with behavioral disturbance: Secondary | ICD-10-CM | POA: Diagnosis not present

## 2015-06-04 DIAGNOSIS — M13869 Other specified arthritis, unspecified knee: Secondary | ICD-10-CM | POA: Diagnosis not present

## 2015-06-04 DIAGNOSIS — Z794 Long term (current) use of insulin: Secondary | ICD-10-CM | POA: Diagnosis not present

## 2015-06-04 DIAGNOSIS — I10 Essential (primary) hypertension: Secondary | ICD-10-CM | POA: Diagnosis not present

## 2015-06-07 ENCOUNTER — Other Ambulatory Visit: Payer: Self-pay | Admitting: Internal Medicine

## 2015-06-11 ENCOUNTER — Other Ambulatory Visit: Payer: Self-pay | Admitting: Internal Medicine

## 2015-06-12 ENCOUNTER — Encounter: Payer: Self-pay | Admitting: Podiatry

## 2015-06-12 ENCOUNTER — Ambulatory Visit (INDEPENDENT_AMBULATORY_CARE_PROVIDER_SITE_OTHER): Payer: Medicare Other | Admitting: Podiatry

## 2015-06-12 DIAGNOSIS — B351 Tinea unguium: Secondary | ICD-10-CM

## 2015-06-12 DIAGNOSIS — M79676 Pain in unspecified toe(s): Secondary | ICD-10-CM | POA: Diagnosis not present

## 2015-06-12 NOTE — Patient Instructions (Signed)
Diabetes and Foot Care Diabetes may cause you to have problems because of poor blood supply (circulation) to your feet and legs. This may cause the skin on your feet to become thinner, break easier, and heal more slowly. Your skin may become dry, and the skin may peel and crack. You may also have nerve damage in your legs and feet causing decreased feeling in them. You may not notice minor injuries to your feet that could lead to infections or more serious problems. Taking care of your feet is one of the most important things you can do for yourself.  HOME CARE INSTRUCTIONS  Wear shoes at all times, even in the house. Do not go barefoot. Bare feet are easily injured.  Check your feet daily for blisters, cuts, and redness. If you cannot see the bottom of your feet, use a mirror or ask someone for help.  Wash your feet with warm water (do not use hot water) and mild soap. Then pat your feet and the areas between your toes until they are completely dry. Do not soak your feet as this can dry your skin.  Apply a moisturizing lotion or petroleum jelly (that does not contain alcohol and is unscented) to the skin on your feet and to dry, brittle toenails. Do not apply lotion between your toes.  Trim your toenails straight across. Do not dig under them or around the cuticle. File the edges of your nails with an emery board or nail file.  Do not cut corns or calluses or try to remove them with medicine.  Wear clean socks or stockings every day. Make sure they are not too tight. Do not wear knee-high stockings since they may decrease blood flow to your legs.  Wear shoes that fit properly and have enough cushioning. To break in new shoes, wear them for just a few hours a day. This prevents you from injuring your feet. Always look in your shoes before you put them on to be sure there are no objects inside.  Do not cross your legs. This may decrease the blood flow to your feet.  If you find a minor scrape,  cut, or break in the skin on your feet, keep it and the skin around it clean and dry. These areas may be cleansed with mild soap and water. Do not cleanse the area with peroxide, alcohol, or iodine.  When you remove an adhesive bandage, be sure not to damage the skin around it.  If you have a wound, look at it several times a day to make sure it is healing.  Do not use heating pads or hot water bottles. They may burn your skin. If you have lost feeling in your feet or legs, you may not know it is happening until it is too late.  Make sure your health care provider performs a complete foot exam at least annually or more often if you have foot problems. Report any cuts, sores, or bruises to your health care provider immediately. SEEK MEDICAL CARE IF:   You have an injury that is not healing.  You have cuts or breaks in the skin.  You have an ingrown nail.  You notice redness on your legs or feet.  You feel burning or tingling in your legs or feet.  You have pain or cramps in your legs and feet.  Your legs or feet are numb.  Your feet always feel cold. SEEK IMMEDIATE MEDICAL CARE IF:   There is increasing redness,   swelling, or pain in or around a wound.  There is a red line that goes up your leg.  Pus is coming from a wound.  You develop a fever or as directed by your health care provider.  You notice a bad smell coming from an ulcer or wound. Document Released: 10/24/2000 Document Revised: 06/29/2013 Document Reviewed: 04/05/2013 Exit[employer] Patient Information 2015 [employer]. This information is not intended to replace advice given to you by your health care provider. Make sure you discuss any questions you have with your health care provider.  

## 2015-06-12 NOTE — Progress Notes (Signed)
Patient ID: Amanda Adkins, female   DOB: August 14, 1941, 74 y.o.   MRN: [id number]  This patient presents today complaining of painful toenails and requesting nail debridement  Objective: Absent toenails 1, 5 bilaterally The remaining toenails are elongated, brittle, incurvated discolored and tender direct palpation  Assessment: History of decreased sensation to 10 g monofilament wire Symptomatic onychomycoses 6-10 Type II diabetic  Plan: Debridement of toenails 6 can likely electrically without any bleeding  Reappoint at three-month intervals or at patient's request

## 2015-06-18 ENCOUNTER — Telehealth: Payer: Self-pay | Admitting: *Deleted

## 2015-06-18 DIAGNOSIS — Z794 Long term (current) use of insulin: Secondary | ICD-10-CM | POA: Diagnosis not present

## 2015-06-18 DIAGNOSIS — E119 Type 2 diabetes mellitus without complications: Secondary | ICD-10-CM | POA: Diagnosis not present

## 2015-06-18 DIAGNOSIS — M13869 Other specified arthritis, unspecified knee: Secondary | ICD-10-CM | POA: Diagnosis not present

## 2015-06-18 DIAGNOSIS — F0281 Dementia in other diseases classified elsewhere with behavioral disturbance: Secondary | ICD-10-CM | POA: Diagnosis not present

## 2015-06-18 DIAGNOSIS — G8929 Other chronic pain: Secondary | ICD-10-CM | POA: Diagnosis not present

## 2015-06-18 DIAGNOSIS — R32 Unspecified urinary incontinence: Secondary | ICD-10-CM | POA: Diagnosis not present

## 2015-06-18 DIAGNOSIS — G309 Alzheimer's disease, unspecified: Secondary | ICD-10-CM | POA: Diagnosis not present

## 2015-06-18 DIAGNOSIS — I10 Essential (primary) hypertension: Secondary | ICD-10-CM | POA: Diagnosis not present

## 2015-06-18 NOTE — Telephone Encounter (Signed)
Amanda Adkins with [facility] called wanting verbal orders to recert patient for pill box filling. Verbal orders given and agreed.

## 2015-06-19 DIAGNOSIS — G309 Alzheimer's disease, unspecified: Secondary | ICD-10-CM | POA: Diagnosis not present

## 2015-06-19 DIAGNOSIS — F0281 Dementia in other diseases classified elsewhere with behavioral disturbance: Secondary | ICD-10-CM | POA: Diagnosis not present

## 2015-06-19 DIAGNOSIS — R32 Unspecified urinary incontinence: Secondary | ICD-10-CM | POA: Diagnosis not present

## 2015-06-19 DIAGNOSIS — E119 Type 2 diabetes mellitus without complications: Secondary | ICD-10-CM | POA: Diagnosis not present

## 2015-06-21 ENCOUNTER — Ambulatory Visit (INDEPENDENT_AMBULATORY_CARE_PROVIDER_SITE_OTHER): Payer: Medicare Other | Admitting: Internal Medicine

## 2015-06-21 ENCOUNTER — Encounter: Payer: Self-pay | Admitting: Internal Medicine

## 2015-06-21 VITALS — BP 132/78 | HR 74 | Temp 98.3°F | Resp 20 | Ht 59.0 in | Wt 196.0 lb

## 2015-06-21 DIAGNOSIS — K868 Other specified diseases of pancreas: Secondary | ICD-10-CM

## 2015-06-21 DIAGNOSIS — E119 Type 2 diabetes mellitus without complications: Secondary | ICD-10-CM | POA: Diagnosis not present

## 2015-06-21 DIAGNOSIS — L409 Psoriasis, unspecified: Secondary | ICD-10-CM | POA: Diagnosis not present

## 2015-06-21 DIAGNOSIS — F0392 Unspecified dementia, unspecified severity, with psychotic disturbance: Secondary | ICD-10-CM

## 2015-06-21 DIAGNOSIS — I10 Essential (primary) hypertension: Secondary | ICD-10-CM | POA: Diagnosis not present

## 2015-06-21 DIAGNOSIS — K8689 Other specified diseases of pancreas: Secondary | ICD-10-CM

## 2015-06-21 DIAGNOSIS — M170 Bilateral primary osteoarthritis of knee: Secondary | ICD-10-CM | POA: Diagnosis not present

## 2015-06-21 DIAGNOSIS — F22 Delusional disorders: Secondary | ICD-10-CM | POA: Diagnosis not present

## 2015-06-21 DIAGNOSIS — N3942 Incontinence without sensory awareness: Secondary | ICD-10-CM

## 2015-06-21 DIAGNOSIS — F0390 Unspecified dementia without behavioral disturbance: Secondary | ICD-10-CM
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
## 2015-06-21 NOTE — Patient Instructions (Signed)
Please ask French Ana to review this medication list when she comes to fill Inice's pillbox.  This is her updated list.  She can call me and verify the list vs. What Ulyssa actually has.  The pharmacy keeps filling her medications that I have discontinued (autofill?).

## 2015-06-22 LAB — CBC WITH DIFFERENTIAL/PLATELET
Basophils Absolute: 0 10*3/uL (ref 0.0–0.2)
Basos: 0 %
EOS (ABSOLUTE): 0.3 10*3/uL (ref 0.0–0.4)
Eos: 3 %
Hematocrit: 37.3 % (ref 34.0–46.6)
Hemoglobin: 11.9 g/dL (ref 11.1–15.9)
Immature Grans (Abs): 0 10*3/uL (ref 0.0–0.1)
Immature Granulocytes: 0 %
Lymphocytes Absolute: 5.3 10*3/uL — ABNORMAL HIGH (ref 0.7–3.1)
Lymphs: 56 %
MCH: 28.1 pg (ref 26.6–33.0)
MCHC: 31.9 g/dL (ref 31.5–35.7)
MCV: 88 fL (ref 79–97)
Monocytes Absolute: 0.5 10*3/uL (ref 0.1–0.9)
Monocytes: 5 %
Neutrophils Absolute: 3.5 10*3/uL (ref 1.4–7.0)
Neutrophils: 36 %
Platelets: 239 10*3/uL (ref 150–379)
RBC: 4.23 x10E6/uL (ref 3.77–5.28)
RDW: 15.4 % (ref 12.3–15.4)
WBC: 9.7 10*3/uL (ref 3.4–10.8)

## 2015-06-22 LAB — COMPREHENSIVE METABOLIC PANEL
ALT: 14 IU/L (ref 0–32)
AST: 15 IU/L (ref 0–40)
Albumin/Globulin Ratio: 1.4 (ref 1.1–2.5)
Albumin: 4.2 g/dL (ref 3.5–4.8)
Alkaline Phosphatase: 87 IU/L (ref 39–117)
BUN/Creatinine Ratio: 17 (ref 11–26)
BUN: 13 mg/dL (ref 8–27)
Bilirubin Total: <0.2 mg/dL (ref 0.0–1.2)
CO2: 26 mmol/L (ref 18–29)
Calcium: 9.6 mg/dL (ref 8.7–10.3)
Chloride: 100 mmol/L (ref 97–108)
Creatinine, Ser: 0.75 mg/dL (ref 0.57–1.00)
GFR calc Af Amer: 91 mL/min/{1.73_m2} (ref >59)
GFR calc non Af Amer: 79 mL/min/{1.73_m2} (ref >59)
Globulin, Total: 3 g/dL (ref 1.5–4.5)
Glucose: 123 mg/dL — ABNORMAL HIGH (ref 65–99)
Potassium: 4.1 mmol/L (ref 3.5–5.2)
Sodium: 142 mmol/L (ref 134–144)
Total Protein: 7.2 g/dL (ref 6.0–8.5)

## 2015-06-22 LAB — HEMOGLOBIN A1C
Est. average glucose Bld gHb Est-mCnc: 137 mg/dL
Hgb A1c MFr Bld: 6.4 % — ABNORMAL HIGH (ref 4.8–5.6)

## 2015-07-01 ENCOUNTER — Encounter: Payer: Self-pay | Admitting: Internal Medicine

## 2015-07-02 DIAGNOSIS — R32 Unspecified urinary incontinence: Secondary | ICD-10-CM | POA: Diagnosis not present

## 2015-07-02 DIAGNOSIS — E119 Type 2 diabetes mellitus without complications: Secondary | ICD-10-CM | POA: Diagnosis not present

## 2015-07-02 DIAGNOSIS — M13869 Other specified arthritis, unspecified knee: Secondary | ICD-10-CM | POA: Diagnosis not present

## 2015-07-02 DIAGNOSIS — G309 Alzheimer's disease, unspecified: Secondary | ICD-10-CM | POA: Diagnosis not present

## 2015-07-02 DIAGNOSIS — G8929 Other chronic pain: Secondary | ICD-10-CM | POA: Diagnosis not present

## 2015-07-02 DIAGNOSIS — I10 Essential (primary) hypertension: Secondary | ICD-10-CM | POA: Diagnosis not present

## 2015-07-02 DIAGNOSIS — Z794 Long term (current) use of insulin: Secondary | ICD-10-CM | POA: Diagnosis not present

## 2015-07-02 DIAGNOSIS — F0281 Dementia in other diseases classified elsewhere with behavioral disturbance: Secondary | ICD-10-CM | POA: Diagnosis not present

## 2015-07-04 ENCOUNTER — Other Ambulatory Visit: Payer: Self-pay | Admitting: Internal Medicine

## 2015-07-16 DIAGNOSIS — G8929 Other chronic pain: Secondary | ICD-10-CM | POA: Diagnosis not present

## 2015-07-16 DIAGNOSIS — M13869 Other specified arthritis, unspecified knee: Secondary | ICD-10-CM | POA: Diagnosis not present

## 2015-07-16 DIAGNOSIS — G309 Alzheimer's disease, unspecified: Secondary | ICD-10-CM | POA: Diagnosis not present

## 2015-07-16 DIAGNOSIS — E119 Type 2 diabetes mellitus without complications: Secondary | ICD-10-CM | POA: Diagnosis not present

## 2015-07-16 DIAGNOSIS — R32 Unspecified urinary incontinence: Secondary | ICD-10-CM | POA: Diagnosis not present

## 2015-07-16 DIAGNOSIS — F0281 Dementia in other diseases classified elsewhere with behavioral disturbance: Secondary | ICD-10-CM | POA: Diagnosis not present

## 2015-07-16 DIAGNOSIS — I10 Essential (primary) hypertension: Secondary | ICD-10-CM | POA: Diagnosis not present

## 2015-07-16 DIAGNOSIS — Z794 Long term (current) use of insulin: Secondary | ICD-10-CM | POA: Diagnosis not present

## 2015-07-18 ENCOUNTER — Telehealth: Payer: Self-pay

## 2015-07-18 NOTE — Telephone Encounter (Signed)
Message left on tirage voicemail- FYI for Dr.Reed. Patient forgot to take her medications several days last week, patient told [facility] nurse that she was stressed out and forgot to take them. [facility] educated patient on risk and benefits of taking medications as prescribed. Patient is doing much better this week taking medication.   No return call necessary

## 2015-07-19 NOTE — Telephone Encounter (Signed)
Noted, thanks!

## 2015-07-30 DIAGNOSIS — I10 Essential (primary) hypertension: Secondary | ICD-10-CM | POA: Diagnosis not present

## 2015-07-30 DIAGNOSIS — Z794 Long term (current) use of insulin: Secondary | ICD-10-CM | POA: Diagnosis not present

## 2015-07-30 DIAGNOSIS — G309 Alzheimer's disease, unspecified: Secondary | ICD-10-CM | POA: Diagnosis not present

## 2015-07-30 DIAGNOSIS — R32 Unspecified urinary incontinence: Secondary | ICD-10-CM | POA: Diagnosis not present

## 2015-07-30 DIAGNOSIS — E119 Type 2 diabetes mellitus without complications: Secondary | ICD-10-CM | POA: Diagnosis not present

## 2015-07-30 DIAGNOSIS — G8929 Other chronic pain: Secondary | ICD-10-CM | POA: Diagnosis not present

## 2015-07-30 DIAGNOSIS — M13869 Other specified arthritis, unspecified knee: Secondary | ICD-10-CM | POA: Diagnosis not present

## 2015-07-30 DIAGNOSIS — F0281 Dementia in other diseases classified elsewhere with behavioral disturbance: Secondary | ICD-10-CM | POA: Diagnosis not present

## 2015-07-31 ENCOUNTER — Other Ambulatory Visit: Payer: Self-pay | Admitting: Internal Medicine

## 2015-08-03 ENCOUNTER — Other Ambulatory Visit: Payer: Self-pay | Admitting: Internal Medicine

## 2015-08-13 DIAGNOSIS — G309 Alzheimer's disease, unspecified: Secondary | ICD-10-CM | POA: Diagnosis not present

## 2015-08-13 DIAGNOSIS — M13869 Other specified arthritis, unspecified knee: Secondary | ICD-10-CM | POA: Diagnosis not present

## 2015-08-13 DIAGNOSIS — Z794 Long term (current) use of insulin: Secondary | ICD-10-CM | POA: Diagnosis not present

## 2015-08-13 DIAGNOSIS — E119 Type 2 diabetes mellitus without complications: Secondary | ICD-10-CM | POA: Diagnosis not present

## 2015-08-13 DIAGNOSIS — I10 Essential (primary) hypertension: Secondary | ICD-10-CM | POA: Diagnosis not present

## 2015-08-13 DIAGNOSIS — R32 Unspecified urinary incontinence: Secondary | ICD-10-CM | POA: Diagnosis not present

## 2015-08-13 DIAGNOSIS — G8929 Other chronic pain: Secondary | ICD-10-CM | POA: Diagnosis not present

## 2015-08-13 DIAGNOSIS — F0281 Dementia in other diseases classified elsewhere with behavioral disturbance: Secondary | ICD-10-CM | POA: Diagnosis not present

## 2015-08-18 DIAGNOSIS — R32 Unspecified urinary incontinence: Secondary | ICD-10-CM | POA: Diagnosis not present

## 2015-08-18 DIAGNOSIS — F0281 Dementia in other diseases classified elsewhere with behavioral disturbance: Secondary | ICD-10-CM | POA: Diagnosis not present

## 2015-08-18 DIAGNOSIS — E119 Type 2 diabetes mellitus without complications: Secondary | ICD-10-CM | POA: Diagnosis not present

## 2015-08-18 DIAGNOSIS — G309 Alzheimer's disease, unspecified: Secondary | ICD-10-CM | POA: Diagnosis not present

## 2015-08-24 ENCOUNTER — Telehealth: Payer: Self-pay | Admitting: *Deleted

## 2015-08-24 NOTE — Telephone Encounter (Signed)
[facility] [employer] [phone] Reymundo Poll(Kimberly Slade)  requesting Diagnosis List, Medication List and Notes from last OV. Patient is being reviewed for continued CAP/DA services through the Department of Health and [employer]. CAP/DA is a Medicaid funded program that allows a person who would normally require care in a nursing home to remain at home and receive in home and community based services.  To fax information to Fax#: [phone] Printed and Faxed.  Also received a order for Dr. Renato Gailseed to sign for Underpads (chux) 150 per month and Undergarmets/Adult pull ups 150 Dx: E10.9. Given order to Dr. Renato Gailseed to sign and fax back.

## 2015-08-27 DIAGNOSIS — G8929 Other chronic pain: Secondary | ICD-10-CM | POA: Diagnosis not present

## 2015-08-27 DIAGNOSIS — E119 Type 2 diabetes mellitus without complications: Secondary | ICD-10-CM | POA: Diagnosis not present

## 2015-08-27 DIAGNOSIS — M13869 Other specified arthritis, unspecified knee: Secondary | ICD-10-CM | POA: Diagnosis not present

## 2015-08-27 DIAGNOSIS — R32 Unspecified urinary incontinence: Secondary | ICD-10-CM | POA: Diagnosis not present

## 2015-08-27 DIAGNOSIS — Z794 Long term (current) use of insulin: Secondary | ICD-10-CM | POA: Diagnosis not present

## 2015-08-27 DIAGNOSIS — G309 Alzheimer's disease, unspecified: Secondary | ICD-10-CM | POA: Diagnosis not present

## 2015-08-27 DIAGNOSIS — I10 Essential (primary) hypertension: Secondary | ICD-10-CM | POA: Diagnosis not present

## 2015-08-27 DIAGNOSIS — F0281 Dementia in other diseases classified elsewhere with behavioral disturbance: Secondary | ICD-10-CM | POA: Diagnosis not present

## 2015-09-10 DIAGNOSIS — G309 Alzheimer's disease, unspecified: Secondary | ICD-10-CM | POA: Diagnosis not present

## 2015-09-10 DIAGNOSIS — F0281 Dementia in other diseases classified elsewhere with behavioral disturbance: Secondary | ICD-10-CM | POA: Diagnosis not present

## 2015-09-10 DIAGNOSIS — I10 Essential (primary) hypertension: Secondary | ICD-10-CM | POA: Diagnosis not present

## 2015-09-10 DIAGNOSIS — E119 Type 2 diabetes mellitus without complications: Secondary | ICD-10-CM | POA: Diagnosis not present

## 2015-09-10 DIAGNOSIS — M13869 Other specified arthritis, unspecified knee: Secondary | ICD-10-CM | POA: Diagnosis not present

## 2015-09-10 DIAGNOSIS — Z794 Long term (current) use of insulin: Secondary | ICD-10-CM | POA: Diagnosis not present

## 2015-09-10 DIAGNOSIS — R32 Unspecified urinary incontinence: Secondary | ICD-10-CM | POA: Diagnosis not present

## 2015-09-10 DIAGNOSIS — G8929 Other chronic pain: Secondary | ICD-10-CM | POA: Diagnosis not present

## 2015-09-11 ENCOUNTER — Other Ambulatory Visit: Payer: Self-pay | Admitting: Internal Medicine

## 2015-09-11 ENCOUNTER — Ambulatory Visit (INDEPENDENT_AMBULATORY_CARE_PROVIDER_SITE_OTHER): Payer: Medicare Other | Admitting: Podiatry

## 2015-09-11 ENCOUNTER — Encounter: Payer: Self-pay | Admitting: Podiatry

## 2015-09-11 DIAGNOSIS — M79674 Pain in right toe(s): Secondary | ICD-10-CM

## 2015-09-11 DIAGNOSIS — M79675 Pain in left toe(s): Secondary | ICD-10-CM

## 2015-09-11 DIAGNOSIS — B351 Tinea unguium: Secondary | ICD-10-CM

## 2015-09-11 NOTE — Patient Instructions (Signed)
Diabetes and Foot Care Diabetes may cause you to have problems because of poor blood supply (circulation) to your feet and legs. This may cause the skin on your feet to become thinner, break easier, and heal more slowly. Your skin may become dry, and the skin may peel and crack. You may also have nerve damage in your legs and feet causing decreased feeling in them. You may not notice minor injuries to your feet that could lead to infections or more serious problems. Taking care of your feet is one of the most important things you can do for yourself.  HOME CARE INSTRUCTIONS  Wear shoes at all times, even in the house. Do not go barefoot. Bare feet are easily injured.  Check your feet daily for blisters, cuts, and redness. If you cannot see the bottom of your feet, use a mirror or ask someone for help.  Wash your feet with warm water (do not use hot water) and mild soap. Then pat your feet and the areas between your toes until they are completely dry. Do not soak your feet as this can dry your skin.  Apply a moisturizing lotion or petroleum jelly (that does not contain alcohol and is unscented) to the skin on your feet and to dry, brittle toenails. Do not apply lotion between your toes.  Trim your toenails straight across. Do not dig under them or around the cuticle. File the edges of your nails with an emery board or nail file.  Do not cut corns or calluses or try to remove them with medicine.  Wear clean socks or stockings every day. Make sure they are not too tight. Do not wear knee-high stockings since they may decrease blood flow to your legs.  Wear shoes that fit properly and have enough cushioning. To break in new shoes, wear them for just a few hours a day. This prevents you from injuring your feet. Always look in your shoes before you put them on to be sure there are no objects inside.  Do not cross your legs. This may decrease the blood flow to your feet.  If you find a minor scrape,  cut, or break in the skin on your feet, keep it and the skin around it clean and dry. These areas may be cleansed with mild soap and water. Do not cleanse the area with peroxide, alcohol, or iodine.  When you remove an adhesive bandage, be sure not to damage the skin around it.  If you have a wound, look at it several times a day to make sure it is healing.  Do not use heating pads or hot water bottles. They may burn your skin. If you have lost feeling in your feet or legs, you may not know it is happening until it is too late.  Make sure your health care provider performs a complete foot exam at least annually or more often if you have foot problems. Report any cuts, sores, or bruises to your health care provider immediately. SEEK MEDICAL CARE IF:   You have an injury that is not healing.  You have cuts or breaks in the skin.  You have an ingrown nail.  You notice redness on your legs or feet.  You feel burning or tingling in your legs or feet.  You have pain or cramps in your legs and feet.  Your legs or feet are numb.  Your feet always feel cold. SEEK IMMEDIATE MEDICAL CARE IF:   There is increasing redness,   swelling, or pain in or around a wound.  There is a red line that goes up your leg.  Pus is coming from a wound.  You develop a fever or as directed by your health care provider.  You notice a bad smell coming from an ulcer or wound.   This information is not intended to replace advice given to you by your health care provider. Make sure you discuss any questions you have with your health care provider.   Document Released: 10/24/2000 Document Revised: 06/29/2013 Document Reviewed: 04/05/2013 Elsevier Interactive Patient Education 2016 [employer]  

## 2015-09-11 NOTE — Progress Notes (Signed)
Patient ID: Amanda Adkins, female   DOB: 06-23-1941, 74 y.o.   MRN: [id number]  Subjective: This patient presents today complaining of painful toenails and presents for scheduled visit for debridement of these nails. She said she was unsatisfied with the debridement on the previous visit because the nails were not debrided sure enough  Objective: Toenails 2-4 bilaterally are elongated, incurvated, hypertrophic and deformed with palpable tenderness in these nails No open skin lesions bilaterally  Assessment: Symptomatic onychomycoses 6 Diabetic  with a history of neuropathy  Plan: Debridement toenails 6 mechanically and electrically without a bleeding. I advised patient that the debridement is nails was the maximum that was indicated. Patient seemed to be satisfied with this  Reappoint 3 months

## 2015-09-21 ENCOUNTER — Encounter: Payer: Self-pay | Admitting: Internal Medicine

## 2015-09-21 ENCOUNTER — Ambulatory Visit (INDEPENDENT_AMBULATORY_CARE_PROVIDER_SITE_OTHER): Payer: Medicare Other | Admitting: Internal Medicine

## 2015-09-21 VITALS — BP 126/76 | HR 68 | Temp 97.7°F | Wt 182.0 lb

## 2015-09-21 DIAGNOSIS — I10 Essential (primary) hypertension: Secondary | ICD-10-CM | POA: Diagnosis not present

## 2015-09-21 DIAGNOSIS — F0390 Unspecified dementia without behavioral disturbance: Secondary | ICD-10-CM | POA: Diagnosis not present

## 2015-09-21 DIAGNOSIS — F0392 Unspecified dementia, unspecified severity, with psychotic disturbance: Secondary | ICD-10-CM

## 2015-09-21 DIAGNOSIS — E1121 Type 2 diabetes mellitus with diabetic nephropathy: Secondary | ICD-10-CM | POA: Diagnosis not present

## 2015-09-21 DIAGNOSIS — M170 Bilateral primary osteoarthritis of knee: Secondary | ICD-10-CM | POA: Diagnosis not present

## 2015-09-21 DIAGNOSIS — F22 Delusional disorders: Secondary | ICD-10-CM

## 2015-09-21 DIAGNOSIS — L409 Psoriasis, unspecified: Secondary | ICD-10-CM

## 2015-09-21 DIAGNOSIS — N3942 Incontinence without sensory awareness: Secondary | ICD-10-CM

## 2015-09-21 DIAGNOSIS — Z23 Encounter for immunization: Secondary | ICD-10-CM | POA: Diagnosis not present

## 2015-09-21 DIAGNOSIS — K8689 Other specified diseases of pancreas: Secondary | ICD-10-CM | POA: Diagnosis not present

## 2015-09-21 NOTE — Progress Notes (Signed)
Patient ID: Amanda Adkins, female   DOB: 1941-01-21, 74 y.o.   MRN: [id number]   Location: [facility] Provider: Gwenith Spitz. Renato Gails, D.O., C.M.D.  Code Status: DNR Goals of Care: Advanced Directive information Does patient have an advance directive?: No  Chief Complaint  Patient presents with  . Medical Management of Chronic Issues    blood sugar, blood pressure, dementia    HPI: Patient is a 74 y.o. female seen in the office today for med mgt of chronic diseases including diabetes, htn, dementia with paranoia, psoriasis and chronic pain.  Today, she c/o left knee pain.  Knee did better after steroid shot I put in there, but is now very painful again.  She did not injure it.  Pain flared up when weather got cooler this week.  Still has no walker b/c walker disappeared when she went to the hospital several months ago.  She comes w/o one at all and says she can't get another one for several years.    Says she has not seen a caregiver in over a week--says they eat her food and watch tv and then tell the agency she is fussing with them.  Needs caregiver though.  Says they tell her what to do and don't do what they're supposed to.    Left shoulder is also giving her a fit and she just had the flu shot in there.  Review of Systems:  Review of Systems  Constitutional: Negative for fever and chills.  HENT: Negative for congestion.   Eyes: Negative for blurred vision.  Respiratory: Negative for shortness of breath.   Cardiovascular: Negative for chest pain, palpitations and leg swelling.  Gastrointestinal: Positive for constipation. Negative for abdominal pain, blood in stool and melena.  Genitourinary: Negative for dysuria.       Urinary incontinence  Musculoskeletal: Positive for myalgias and joint pain. Negative for falls.       Knees, shoulders  Skin: Positive for itching and rash.       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    Past Medical History  Diagnosis Date  . Diabetes mellitus without complication (HCC)   . Anxiety   . GERD (gastroesophageal reflux disease)   . Vitamin B12 deficiency   . Hyperlipidemia   . Tobacco use disorder   . Posttraumatic stress disorder   . Hypertension   . Allergy   . Allergic rhinitis due to pollen   . Osteoporosis   . Other malaise and fatigue   . Abnormality of gait   . Hepatomegaly   . History of fall   . Muscle weakness (generalized)   . Diarrhea   . Unspecified constipation   . Abdominal pain, generalized   . Kyphosis (acquired) (postural)   . Posttraumatic stress disorder   . Other psoriasis   . Abnormality of gait   . Other B-complex deficiencies   . Dementia in conditions classified elsewhere without behavioral disturbance   . Pain   . Other malaise and fatigue   . Personal history of fall   . Hypercalcemia   . Senile osteoporosis   . Cervicitis and endocervicitis   . Unspecified pruritic disorder   . Closed dislocation of shoulder, unspecified site   . Leukocytosis, unspecified   . Other specified pre-operative examination   . Abdominal pain, generalized   .  Tinnitus   . Pain in joint, shoulder region   . Pain in joint, lower leg   . Impacted cerumen   . Diarrhea   . Hypopotassemia   . Diverticulosis of colon (without mention of hemorrhage)   . Tobacco use disorder   . Reflux esophagitis   . Cough   . Abnormality of gait   . Acute bronchitis   . 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 Hepatomegaly   . Dermatitis   . Senile dementia, uncomplicated   . Acute sinusitis, unspecified   . Type II or unspecified type diabetes mellitus without mention of complication, uncontrolled   . Contact with or exposure to other communicable diseases(V01.89)   . Candidiasis of vulva  and vagina   . Type II or unspecified type diabetes mellitus without mention of complication, not stated as uncontrolled   . Other and unspecified hyperlipidemia   . Anxiety state, unspecified   . Unspecified essential hypertension   . Allergic rhinitis due to pollen   . Pain in joint, site unspecified   . Stiffness of joint, not elsewhere classified, unspecified site   . Abdominal pain, unspecified site   . Vascular dementia   . Shoulder dislocation   . Psoriasis   . Constipation, chronic   . Senile osteoporosis   . Osteoarthritis of both knees   . Hearing loss sensory, bilateral   . Tobacco abuse   . Chronic pain of both shoulders     Past Surgical History  Procedure Laterality Date  . Bladder surgery    . Spine surgery    . Orif patella Left 04/08/2013    Procedure: OPEN REDUCTION INTERNAL (ORIF) FIXATION PATELLA;  Surgeon: Eldred Manges, MD;  Location: [facility] OR;  Service: Orthopedics;  Laterality: Left;  Open Reduction Internal Fixation Left Patella Fracture    Allergies  Allergen Reactions  . Buspar [Buspirone] Hives  . Percocet [Oxycodone-Acetaminophen]     Hallucination  . Vicodin [Hydrocodone-Acetaminophen]     hallucination      Medication List       This list is accurate as of: 09/21/15 11:54 AM.  Always use your most recent med list.               ACCU-CHEK AVIVA PLUS test strip  Generic drug:  glucose blood  USE AS INSTRUCTED TO TEST BLOOD SUGAR     ACCU-CHEK FASTCLIX LANCETS Misc  CHECK BLOOD SUGAR EVERY DAY     acetaminophen 500 MG tablet  Commonly known as:  TYLENOL  Take 2 tablets (1,000 mg total) by mouth 3 (three) times daily. For arthritis pain     atenolol 50 MG tablet  Commonly known as:  TENORMIN  TAKE 1 TABLET BY MOUTH EVERY DAY     atorvastatin 10 MG tablet  Commonly known as:  LIPITOR  TAKE ONE TABLET BY MOUTH DAILY.     ATROVENT HFA 17 MCG/ACT inhaler  Generic drug:  ipratropium  INHALE 2 PUFFS UP TO 4 TIMES DAILY AS NEEDED TO  HELP BREATHING/WHEEZING/COUGH     ATROVENT HFA 17 MCG/ACT inhaler  Generic drug:  ipratropium  INHALE 2 PUFFS UP TO 4 TIMES DAILY AS NEEDED TO HELP BREATHING/WHEEZING/COUGH     CREON 36000 UNITS Cpep capsule  Generic drug:  lipase/protease/amylase  TAKE 1 CAPSULE BY MOUTH THREE TIMES A DAY BEFORE MEALS     diazepam 2 MG tablet  Commonly known as:  VALIUM  TAKE 1 TABLET BY MOUTH EVERY 8 HOURS AS NEEDED FOR ANXIETY     dicyclomine 10 MG capsule  Commonly known as:  BENTYL  TAKE 1 CAPSULE BY MOUTH 30 MINUTES BEFORE MEALS AND AT BEDTIME.     donepezil 10 MG tablet  Commonly known as:  ARICEPT  TAKE 1 TABLET BY MOUTH EVERY NIGHT AT BEDTIME     DULoxetine 30 MG capsule  Commonly known as:  CYMBALTA  TAKE 1 CAPSULE BY MOUTH DAILY     esomeprazole 40 MG capsule  Commonly known as:  NEXIUM  TAKE 1 CAPSULE BY MOUTH DAILY AT 12PM NOON     fluocinolone 0.025 % ointment  Commonly known as:  SYNALAR  APPLY TO AFFECTED AREA ON ARMS     furosemide 20 MG tablet  Commonly known as:  LASIX  TAKE 1 TABLET BY MOUTH EVERY DAY ** STOP HYDROCHLOROTHIAZIDE     gabapentin 300 MG capsule  Commonly known as:  NEURONTIN  TAKE 1 CAPSULE BY MOUTH EVERY DAY FOR NERVE PAIN     hydrocortisone 2.5 % lotion  APPLY TO NECK AREA TWICE DAILY AS NEEDED FOR ITCHY RASH     metFORMIN 500 MG tablet  Commonly known as:  GLUCOPHAGE  TAKE 1 TABLET BY MOUTH TWICE DAILY WITH A MEAL FOR DIABETES     NAMENDA XR 28 MG Cp24 24 hr capsule  Generic drug:  memantine  TAKE 1 CAPSULE BY MOUTH DAILY     NON FORMULARY  Formula 1117 cream, 360 grams, with 5 refills.   Use as directed for knee pain     nystatin cream  Commonly known as:  MYCOSTATIN  APPLY TO AFFECTED AREA TWICE DAILY     potassium chloride SA 20 MEQ tablet  Commonly known as:  K-DUR,KLOR-CON  TAKE 1 TABLET BY MOUTH 2 TIMES A DAY     PROAIR HFA 108 (90 BASE) MCG/ACT inhaler  Generic drug:  albuterol  INHALE 2 PUFFS UP TO 4 TIMES DAILY AS NEEDED  TO HELP BREATHING/WHEEZING/COUGH     ramipril 10 MG capsule  Commonly known as:  ALTACE  TAKE 1 CAPSULE BY MOUTH EVERY DAY FOR BLOOD PRESSURE     risperiDONE 0.25 MG tablet  Commonly known as:  RISPERDAL  TAKE 1 TABLET BY MOUTH EVERY NIGHT AT BEDTIME     sucralfate 1 G tablet  Commonly known as:  CARAFATE  TAKE 1 TABLET BY MOUTH BEFORE MEALS AND AT BEDTIME TO PROTECT STOMACH     traZODone 150 MG tablet  Commonly known as:  DESYREL  TAKE 1 TABLET BY MOUTH NIGHTLY TO HELP WITH NERVES AND REST        Health Maintenance  Topic Date Due  . FOOT EXAM  07/25/1950  . TETANUS/TDAP  07/26/1959  . ZOSTAVAX  07/25/2000  . INFLUENZA VACCINE  06/11/2015  . OPHTHALMOLOGY EXAM  07/22/2015  . HEMOGLOBIN A1C  12/22/2015  . COLONOSCOPY  07/21/2022  . DEXA SCAN  Completed  . PNA vac Low Risk Adult  Completed    Physical Exam: Filed Vitals:   09/21/15 1137  BP: 126/76  Pulse: 68  Temp: 97.7 F (36.5 C)  TempSrc: Oral  Weight: 182 lb (82.555 kg)  SpO2: 97%   Body mass index is 36.74 kg/(m^2). Physical Exam  Constitutional: No distress.  Cardiovascular: Normal rate, regular rhythm, normal heart sounds and intact distal pulses.   Pulmonary/Chest: Effort normal and breath sounds normal. No respiratory distress.  Abdominal: Soft. Bowel sounds are  normal.  Musculoskeletal: Normal range of motion. She exhibits tenderness.  Left knee with small effusion, warm and tender  Neurological: She is alert. No cranial nerve deficit.  Oriented to person and place, sometimes repeats self  Skin:  Patches of psoriasis posterior neck at hairline and ears, elbows  Psychiatric:  Pleasant today to everyone    Labs reviewed: Basic Metabolic Panel:  Recent Labs  16/10/96 1609 03/12/15 1534 06/21/15 1422  NA 142 139 142  K 4.1 4.5 4.1  CL 103 98 100  CO2 24 22 26   GLUCOSE 91 87 123*  BUN 18 19 13   CREATININE 0.63 0.86 0.75  CALCIUM 9.3 10.0 9.6   Liver Function Tests:  Recent Labs   12/22/14 1609 03/12/15 1534 06/21/15 1422  AST 16 18 15   ALT 15 14 14   ALKPHOS 91 106 87  BILITOT 0.2 <0.2 <0.2  PROT 6.9 7.6 7.2  ALBUMIN 4.0 4.2 4.2   No results for input(s): LIPASE, AMYLASE in the last 8760 hours. No results for input(s): AMMONIA in the last 8760 hours. CBC:  Recent Labs  09/28/14 1200 12/22/14 1609 03/12/15 1534 06/21/15 1422  WBC 11.2* 10.0 11.7* 9.7  NEUTROABS 4.1 3.5 4.1 3.5  HGB 12.1 12.2  --   --   HCT 37.0 36.6 38.7 37.3  MCV 89 90  --   --   PLT 195 184  --   --    Lipid Panel:  Recent Labs  09/28/14 1200  CHOL 144  HDL 57  LDLCALC 61  TRIG 128  CHOLHDL 2.5   Lab Results  Component Value Date   HGBA1C 6.4* 06/21/2015   Assessment/Plan 1. Primary osteoarthritis of both knees -left knee pain much worse recently and having difficulty walking on it--requests a steroid injection for pain in the left knee -area cleansed with betadine, 1cc lidocaine and 1cc depomedrol (40mg ) injected into left knee -need to get her another walker b/c hers was lost during a hospitalization  2. Controlled type 2 diabetes mellitus with diabetic nephropathy, without long-term current use of insulin (HCC) - cont diet only, has been losing weight probably due to no social support system - Hemoglobin A1c - Lipid panel  3. 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 Psychosis, paranoid (HCC) -remains problematic, is to be taking risperdal to cope with this, again question if taking when she does not have any supervision  5. Essential hypertension, benign -bp is at goal with current regimen - CBC with Differential/Platelet - Comprehensive metabolic panel  6. Pancreatic insufficiency (HCC) -having loose stools again probably b/c she's not using her meds when no caregivers  are coming in  7. 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 Urinary incontinence without sensory awareness -ongoing, says it's just not going to improve -counseled on changes as a result of aging  9. Encounter for influenza immunization -given  Labs/tests ordered:   Orders Placed This Encounter  Procedures  . Flu Vaccine QUAD 36+ mos IM  . Hemoglobin A1c  . Lipid panel    Order Specific Question:  Has the patient fasted?    Answer:  Yes  . CBC with Differential/Platelet  . Comprehensive metabolic panel  Order Specific Question:  Has the patient fasted?    Answer:  Yes    Next appt:  5 wks for f/u on knee pain, unsteady gait Kashmir Lysaght L. Icela Glymph, D.O. Geriatrics [facility] [address], [place] [address] Cell Phone (Mon-Fri 8am-5pm):  [phone] On Call:  [phone] & follow prompts after 5pm & weekends Office Phone:  [phone] Office Fax:  [phone]

## 2015-09-22 LAB — COMPREHENSIVE METABOLIC PANEL
ALT: 10 IU/L (ref 0–32)
AST: 14 IU/L (ref 0–40)
Albumin/Globulin Ratio: 1.2 (ref 1.1–2.5)
Albumin: 4.1 g/dL (ref 3.5–4.8)
Alkaline Phosphatase: 127 IU/L — ABNORMAL HIGH (ref 39–117)
BUN/Creatinine Ratio: 24 (ref 11–26)
BUN: 20 mg/dL (ref 8–27)
Bilirubin Total: 0.3 mg/dL (ref 0.0–1.2)
CO2: 25 mmol/L (ref 18–29)
Calcium: 10.7 mg/dL — ABNORMAL HIGH (ref 8.7–10.3)
Chloride: 102 mmol/L (ref 97–106)
Creatinine, Ser: 0.82 mg/dL (ref 0.57–1.00)
GFR calc Af Amer: 81 mL/min/{1.73_m2} (ref >59)
GFR calc non Af Amer: 70 mL/min/{1.73_m2} (ref >59)
Globulin, Total: 3.3 g/dL (ref 1.5–4.5)
Glucose: 83 mg/dL (ref 65–99)
Potassium: 4.2 mmol/L (ref 3.5–5.2)
Sodium: 144 mmol/L (ref 136–144)
Total Protein: 7.4 g/dL (ref 6.0–8.5)

## 2015-09-22 LAB — HEMOGLOBIN A1C
Est. average glucose Bld gHb Est-mCnc: 137 mg/dL
Hgb A1c MFr Bld: 6.4 % — ABNORMAL HIGH (ref 4.8–5.6)

## 2015-09-22 LAB — CBC WITH DIFFERENTIAL/PLATELET
Basophils Absolute: 0 10*3/uL (ref 0.0–0.2)
Basos: 0 %
EOS (ABSOLUTE): 0.5 10*3/uL — ABNORMAL HIGH (ref 0.0–0.4)
Eos: 5 %
Hematocrit: 39 % (ref 34.0–46.6)
Hemoglobin: 12.4 g/dL (ref 11.1–15.9)
Immature Grans (Abs): 0 10*3/uL (ref 0.0–0.1)
Immature Granulocytes: 0 %
Lymphocytes Absolute: 5 10*3/uL — ABNORMAL HIGH (ref 0.7–3.1)
Lymphs: 50 %
MCH: 27.9 pg (ref 26.6–33.0)
MCHC: 31.8 g/dL (ref 31.5–35.7)
MCV: 88 fL (ref 79–97)
Monocytes Absolute: 0.5 10*3/uL (ref 0.1–0.9)
Monocytes: 5 %
Neutrophils Absolute: 4 10*3/uL (ref 1.4–7.0)
Neutrophils: 40 %
Platelets: 212 10*3/uL (ref 150–379)
RBC: 4.45 x10E6/uL (ref 3.77–5.28)
RDW: 14.9 % (ref 12.3–15.4)
WBC: 10 10*3/uL (ref 3.4–10.8)

## 2015-09-22 LAB — LIPID PANEL
Chol/HDL Ratio: 3.2 ratio units (ref 0.0–4.4)
Cholesterol, Total: 145 mg/dL (ref 100–199)
HDL: 45 mg/dL (ref >39)
LDL Calculated: 74 mg/dL (ref 0–99)
Triglycerides: 132 mg/dL (ref 0–149)
VLDL Cholesterol Cal: 26 mg/dL (ref 5–40)

## 2015-09-24 DIAGNOSIS — G309 Alzheimer's disease, unspecified: Secondary | ICD-10-CM | POA: Diagnosis not present

## 2015-09-24 DIAGNOSIS — E119 Type 2 diabetes mellitus without complications: Secondary | ICD-10-CM | POA: Diagnosis not present

## 2015-10-08 DIAGNOSIS — G309 Alzheimer's disease, unspecified: Secondary | ICD-10-CM | POA: Diagnosis not present

## 2015-10-08 DIAGNOSIS — E119 Type 2 diabetes mellitus without complications: Secondary | ICD-10-CM | POA: Diagnosis not present

## 2015-10-09 ENCOUNTER — Other Ambulatory Visit: Payer: Self-pay | Admitting: Internal Medicine

## 2015-10-09 ENCOUNTER — Telehealth: Payer: Self-pay | Admitting: *Deleted

## 2015-10-09 NOTE — Telephone Encounter (Signed)
French Anaracy with [facility] called and wanted verbal orders to continue PT. Verbal Orders given.

## 2015-10-15 DIAGNOSIS — G309 Alzheimer's disease, unspecified: Secondary | ICD-10-CM | POA: Diagnosis not present

## 2015-10-15 DIAGNOSIS — E119 Type 2 diabetes mellitus without complications: Secondary | ICD-10-CM | POA: Diagnosis not present

## 2015-10-17 DIAGNOSIS — E119 Type 2 diabetes mellitus without complications: Secondary | ICD-10-CM | POA: Diagnosis not present

## 2015-10-17 DIAGNOSIS — G309 Alzheimer's disease, unspecified: Secondary | ICD-10-CM | POA: Diagnosis not present

## 2015-10-17 DIAGNOSIS — R32 Unspecified urinary incontinence: Secondary | ICD-10-CM | POA: Diagnosis not present

## 2015-10-17 DIAGNOSIS — F0281 Dementia in other diseases classified elsewhere with behavioral disturbance: Secondary | ICD-10-CM | POA: Diagnosis not present

## 2015-10-29 DIAGNOSIS — E119 Type 2 diabetes mellitus without complications: Secondary | ICD-10-CM | POA: Diagnosis not present

## 2015-10-29 DIAGNOSIS — G309 Alzheimer's disease, unspecified: Secondary | ICD-10-CM | POA: Diagnosis not present

## 2015-10-30 ENCOUNTER — Other Ambulatory Visit: Payer: Self-pay | Admitting: Internal Medicine

## 2015-11-01 ENCOUNTER — Ambulatory Visit: Payer: Medicare Other | Admitting: Internal Medicine

## 2015-11-01 ENCOUNTER — Telehealth: Payer: Self-pay | Admitting: *Deleted

## 2015-11-01 NOTE — Telephone Encounter (Signed)
[facility] called and stated that she has concerns that need to be addressed at patient's appointment today:   1) Patient is not coming today for appointment due to pain. Stated that patient will be calling to reschedule appointment. Ms. Lorenda HatchetSlade will follow up with patient to make sure she reschedules appointment.   2) Needs some time of device to help with mobility. Not eligible for walker due to insurance. Would like a Rx for a 4 prong cane. To discuss at next appointment. Please Send Rx to [facility] with Progress note.  3) Pill box management through [facility] for every other week. Patient has history of Anxiety/Parnoia and sometimes patient gets agigitated very easily. Very paranoid. Would like for you to review her medications and see if she can start something or make a referral to pychiatrist. Patient very blunt and sometimes make up stories. Always has the same theme. Thinking she has hallucinations and disrupted behavior.   Social Worker would like these concerns to be addressed at patient's next appointment.

## 2015-11-01 NOTE — Telephone Encounter (Signed)
All of these issues are under treatment if pt is taking the meds as directed.  There have been problems for years with med refills and taking meds I've stopped.  She can't keep a consistent person to help her.  I will discuss all of this at her next appt.

## 2015-11-06 ENCOUNTER — Other Ambulatory Visit: Payer: Self-pay | Admitting: Internal Medicine

## 2015-12-03 ENCOUNTER — Encounter: Payer: Self-pay | Admitting: Internal Medicine

## 2015-12-03 ENCOUNTER — Ambulatory Visit (INDEPENDENT_AMBULATORY_CARE_PROVIDER_SITE_OTHER): Payer: Medicare Other | Admitting: Internal Medicine

## 2015-12-03 VITALS — BP 132/86 | HR 70 | Temp 97.9°F | Resp 20 | Ht 59.0 in | Wt 184.0 lb

## 2015-12-03 DIAGNOSIS — I10 Essential (primary) hypertension: Secondary | ICD-10-CM

## 2015-12-03 DIAGNOSIS — F0390 Unspecified dementia without behavioral disturbance: Secondary | ICD-10-CM

## 2015-12-03 DIAGNOSIS — E1121 Type 2 diabetes mellitus with diabetic nephropathy: Secondary | ICD-10-CM

## 2015-12-03 DIAGNOSIS — R1032 Left lower quadrant pain: Secondary | ICD-10-CM | POA: Diagnosis not present

## 2015-12-03 DIAGNOSIS — M5442 Lumbago with sciatica, left side: Secondary | ICD-10-CM

## 2015-12-03 DIAGNOSIS — M1712 Unilateral primary osteoarthritis, left knee: Secondary | ICD-10-CM

## 2015-12-03 DIAGNOSIS — F0392 Unspecified dementia, unspecified severity, with psychotic disturbance: Secondary | ICD-10-CM

## 2015-12-03 MED ORDER — TRAMADOL HCL 50 MG PO TABS: 50.0000 mg | ORAL_TABLET | Freq: Three times a day (TID) | ORAL | Status: DC | PRN

## 2015-12-03 NOTE — Patient Instructions (Signed)
I would like for you to get an ultrasound of your belly to make sure there is nothing wrong. I suspect your pain is coming from your back. I have prescribed tramadol for your pain. Come back to see me next month for your regular visit.

## 2015-12-04 ENCOUNTER — Other Ambulatory Visit: Payer: Self-pay | Admitting: Internal Medicine

## 2015-12-05 ENCOUNTER — Other Ambulatory Visit: Payer: Self-pay | Admitting: *Deleted

## 2015-12-05 MED ORDER — DIAZEPAM 2 MG PO TABS: 2.0000 mg | ORAL_TABLET | Freq: Three times a day (TID) | ORAL | Status: DC | PRN

## 2015-12-06 ENCOUNTER — Other Ambulatory Visit: Payer: Self-pay
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
## 2015-12-10 ENCOUNTER — Other Ambulatory Visit: Payer: Self-pay | Admitting: Internal Medicine

## 2015-12-10 ENCOUNTER — Ambulatory Visit
Admission: RE | Admit: 2015-12-10 | Discharge: 2015-12-10 | Disposition: A | Discharge status: Home / Self Care | Payer: Medicare Other | Source: Ambulatory Visit | Attending: Internal Medicine | Admitting: Internal Medicine

## 2015-12-10 DIAGNOSIS — R1032 Left lower quadrant pain: Secondary | ICD-10-CM

## 2015-12-13 ENCOUNTER — Telehealth: Payer: Self-pay

## 2015-12-13 NOTE — Telephone Encounter (Signed)
Message left on triage voicemail: Called to request verbal order to recert patient for another 60 days for continued monitoring.   Per standing protocol, I called and left message ok'ing verbal order

## 2015-12-16 DIAGNOSIS — R32 Unspecified urinary incontinence: Secondary | ICD-10-CM | POA: Diagnosis not present

## 2015-12-16 DIAGNOSIS — E119 Type 2 diabetes mellitus without complications: Secondary | ICD-10-CM | POA: Diagnosis not present

## 2015-12-16 DIAGNOSIS — G309 Alzheimer's disease, unspecified: Secondary | ICD-10-CM | POA: Diagnosis not present

## 2015-12-16 DIAGNOSIS — F0281 Dementia in other diseases classified elsewhere with behavioral disturbance: Secondary | ICD-10-CM | POA: Diagnosis not present

## 2015-12-18 ENCOUNTER — Ambulatory Visit: Payer: Medicare Other | Admitting: Podiatry

## 2015-12-25 ENCOUNTER — Other Ambulatory Visit: Payer: Self-pay | Admitting: *Deleted

## 2015-12-25 MED ORDER — AMBULATORY NON FORMULARY MEDICATION: Status: DC

## 2015-12-25 NOTE — Telephone Encounter (Signed)
French Ana with [facility] requested to be faxed to [facility]

## 2015-12-26 ENCOUNTER — Telehealth: Payer: Self-pay

## 2015-12-26 NOTE — Telephone Encounter (Signed)
Letter mailed to patient regarding overdue mammogram. M.Simpson 

## 2015-12-28 ENCOUNTER — Ambulatory Visit: Payer: Medicare Other | Admitting: Internal Medicine

## 2015-12-31 ENCOUNTER — Ambulatory Visit (INDEPENDENT_AMBULATORY_CARE_PROVIDER_SITE_OTHER): Payer: Medicare Other | Admitting: Internal Medicine

## 2015-12-31 ENCOUNTER — Encounter: Payer: Self-pay | Admitting: Internal Medicine

## 2015-12-31 VITALS — BP 130/84 | HR 66 | Temp 97.6°F | Ht 59.0 in | Wt 182.0 lb

## 2015-12-31 DIAGNOSIS — R1032 Left lower quadrant pain: Secondary | ICD-10-CM

## 2015-12-31 DIAGNOSIS — F0390 Unspecified dementia without behavioral disturbance: Secondary | ICD-10-CM | POA: Diagnosis not present

## 2015-12-31 DIAGNOSIS — K5900 Constipation, unspecified: Secondary | ICD-10-CM

## 2015-12-31 DIAGNOSIS — M1712 Unilateral primary osteoarthritis, left knee: Secondary | ICD-10-CM | POA: Diagnosis not present

## 2015-12-31 DIAGNOSIS — F0392 Unspecified dementia, unspecified severity, with psychotic disturbance: Secondary | ICD-10-CM

## 2015-12-31 DIAGNOSIS — K5909 Other constipation: Secondary | ICD-10-CM

## 2015-12-31 MED ORDER — METHYLPREDNISOLONE ACETATE 40 MG/ML IJ SUSP
40.0000 mg | Freq: Once | INTRAMUSCULAR | Status: AC
  Administered 2015-12-31: 40 mg via INTRAMUSCULAR

## 2015-12-31 NOTE — Patient Instructions (Signed)
Take miralax daily for constipation. If it does not work once a day, you may increase to twice a day, but stop if you get diarrhea.

## 2015-12-31 NOTE — Progress Notes (Signed)
Patient ID: Amanda Adkins, female   DOB: 12-Jun-1941, 75 y.o.   MRN: [id number]  [facility] Provider: Markisha Meding L. Renato Gails, D.O., C.M.D.  Code Status: DNR Goals of Care:  Advanced Directives 12/31/2015  Does patient have an advance directive? No  Type of Advance Directive -  Would patient like information on creating an advanced directive? -     Chief Complaint  Patient presents with  . Acute Visit    pain in left knee, pain in lower abdominal     HPI: Patient is a 75 y.o. female seen today for an acute visit for pain in her left knee.  This has gotten worse over time and she requests an injection as oral and topical meds are not effective.  She also still c/o LLQ abdominal pain.  Admits she is not having regular BMs.  I can't get a straight story about whether she is using any stool softener or miralax at present.    Past Medical History  Diagnosis Date  . Diabetes mellitus without complication (HCC)   . Anxiety   . GERD (gastroesophageal reflux disease)   . Vitamin B12 deficiency   . Hyperlipidemia   . Tobacco use disorder   . Posttraumatic stress disorder   . Hypertension   . Allergy   . Allergic rhinitis due to pollen   . Osteoporosis   . Other malaise and fatigue   . Abnormality of gait   . Hepatomegaly   . History of fall   . Muscle weakness (generalized)   . Diarrhea   . Unspecified constipation   . Abdominal pain, generalized   . Kyphosis (acquired) (postural)   . Posttraumatic stress disorder   . Other psoriasis   . Abnormality of gait   . Other B-complex deficiencies   . Dementia in conditions classified elsewhere without behavioral disturbance   . Pain   . Other malaise and fatigue   . Personal history of fall   . Hypercalcemia   . Senile osteoporosis   . Cervicitis and endocervicitis   . Unspecified pruritic disorder   . Closed dislocation of shoulder, unspecified site   . Leukocytosis, unspecified   . Other specified pre-operative examination   .  Abdominal pain, generalized   . Tinnitus   . Pain in joint, shoulder region   . Pain in joint, lower leg   . Impacted cerumen   . Diarrhea   . Hypopotassemia   . Diverticulosis of colon (without mention of hemorrhage)   . Tobacco use disorder   . Reflux esophagitis   . Cough   . Abnormality of gait   . Acute bronchitis   . 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 Hepatomegaly   . Dermatitis   . Senile dementia, uncomplicated   . Acute sinusitis, unspecified   . Type II or unspecified type diabetes mellitus without mention of complication, uncontrolled   . Contact with or exposure to other communicable diseases(V01.89)   . Candidiasis of vulva and vagina   . Type II or unspecified type diabetes mellitus without mention of complication, not stated as uncontrolled   . Other and unspecified hyperlipidemia   . Anxiety state, unspecified   . Unspecified essential hypertension   . Allergic rhinitis due to pollen   . Pain in joint, site unspecified   . Stiffness of joint, not elsewhere classified, unspecified site   . Abdominal pain, unspecified site   . Vascular dementia   .  Shoulder dislocation   . Psoriasis   . Constipation, chronic   . Senile osteoporosis   . Osteoarthritis of both knees   . Hearing loss sensory, bilateral   . Tobacco abuse   . Chronic pain of both shoulders     Past Surgical History  Procedure Laterality Date  . Bladder surgery    . Spine surgery    . Orif patella Left 04/08/2013    Procedure: OPEN REDUCTION INTERNAL (ORIF) FIXATION PATELLA;  Surgeon: Eldred Manges, MD;  Location: [facility] OR;  Service: Orthopedics;  Laterality: Left;  Open Reduction Internal Fixation Left Patella Fracture    Allergies  Allergen Reactions  . Buspar [Buspirone] Hives  . Percocet [Oxycodone-Acetaminophen]     Hallucination  . Vicodin [Hydrocodone-Acetaminophen]     hallucination      Medication List         This list is accurate as of: 12/31/15  3:35 PM.  Always use your most recent med list.               ACCU-CHEK AVIVA PLUS test strip  Generic drug:  glucose blood  USE AS INSTRUCTED TO TEST BLOOD SUGAR     ACCU-CHEK FASTCLIX LANCETS Misc  CHECK BLOOD SUGAR EVERY DAY     acetaminophen 500 MG tablet  Commonly known as:  TYLENOL  Take 2 tablets (1,000 mg total) by mouth 3 (three) times daily. For arthritis pain     AMBULATORY NON FORMULARY MEDICATION  Accu Chek Nano Smartview Test Strips Sig: Use to test blood sugars twice daily. Dx: E11.21     atenolol 50 MG tablet  Commonly known as:  TENORMIN  TAKE 1 TABLET BY MOUTH EVERY DAY     atorvastatin 10 MG tablet  Commonly known as:  LIPITOR  TAKE ONE TABLET BY MOUTH DAILY.     ATROVENT HFA 17 MCG/ACT inhaler  Generic drug:  ipratropium  INHALE 2 PUFFS UP TO 4 TIMES DAILY AS NEEDED TO HELP BREATHING/WHEEZING/COUGH     Calcipotriene 0.005 % solution  APPLY TO SCALP DAILY     CREON 36000 UNITS Cpep capsule  Generic drug:  lipase/protease/amylase  TAKE 1 CAPSULE BY MOUTH THREE TIMES A DAY BEFORE MEALS     diazepam 2 MG tablet  Commonly known as:  VALIUM  Take 1 tablet (2 mg total) by mouth every 8 (eight) hours as needed. for anxiety     dicyclomine 10 MG capsule  Commonly known as:  BENTYL  TAKE 1 CAPSULE BY MOUTH 30 MINUTES BEFORE MEALS AND AT BEDTIME.     donepezil 10 MG tablet  Commonly known as:  ARICEPT  TAKE 1 TABLET BY MOUTH EVERY NIGHT AT BEDTIME     DULoxetine 30 MG capsule  Commonly known as:  CYMBALTA  TAKE 1 CAPSULE BY MOUTH DAILY     esomeprazole 40 MG capsule  Commonly known as:  NEXIUM  TAKE 1 CAPSULE BY MOUTH DAILY AT 12PM NOON     fluocinolone 0.025 % ointment  Commonly known as:  SYNALAR  APPLY TO AFFECTED AREA ON ARMS     furosemide 20 MG tablet  Commonly known as:  LASIX  TAKE 1 TABLET BY MOUTH EVERY DAY ** STOP HYDROCHLOROTHIAZIDE     gabapentin 300 MG capsule  Commonly known as:   NEURONTIN  TAKE 1 CAPSULE BY MOUTH EVERY DAY FOR NERVE PAIN     hydrocortisone 2.5 % lotion  APPLY TO NECK AREA TWICE DAILY AS NEEDED FOR ITCHY RASH  metFORMIN 500 MG tablet  Commonly known as:  GLUCOPHAGE  TAKE 1 TABLET BY MOUTH TWICE DAILY WITH A MEAL FOR DIABETES     NAMENDA XR 28 MG Cp24 24 hr capsule  Generic drug:  memantine  TAKE 1 CAPSULE BY MOUTH DAILY     NON FORMULARY  Formula 1117 cream, 360 grams, with 5 refills.   Use as directed for knee pain     nystatin cream  Commonly known as:  MYCOSTATIN  APPLY TO AFFECTED AREA TWICE DAILY     potassium chloride SA 20 MEQ tablet  Commonly known as:  K-DUR,KLOR-CON  TAKE 1 TABLET BY MOUTH 2 TIMES A DAY     PROAIR HFA 108 (90 Base) MCG/ACT inhaler  Generic drug:  albuterol  INHALE 2 PUFFS UP TO 4 TIMES DAILY AS NEEDED TO HELP BREATHING/WHEEZING/COUGH     ramipril 10 MG capsule  Commonly known as:  ALTACE  TAKE 1 CAPSULE BY MOUTH EVERY DAY FOR BLOOD PRESSURE     risperiDONE 0.25 MG tablet  Commonly known as:  RISPERDAL  TAKE 1 TABLET BY MOUTH EVERY NIGHT AT BEDTIME     sucralfate 1 g tablet  Commonly known as:  CARAFATE  TAKE 1 TABLET BY MOUTH BEFORE MEALS AND AT BEDTIME TO PROTECT STOMACH     traMADol 50 MG tablet  Commonly known as:  ULTRAM  TAKE 1 TABLET BY MOUTH EVERY 8 HOURS AS NEEDED FOR SEVERE PAIN (IN KNEE AND ABDOMEN)     traZODone 150 MG tablet  Commonly known as:  DESYREL  TAKE 1 TABLET BY MOUTH NIGHTLY TO HELP WITH NERVES AND REST        Review of Systems:  Review of Systems  Constitutional: Negative for fever, chills and appetite change.  Respiratory: Negative for shortness of breath.   Cardiovascular: Negative for chest pain.  Gastrointestinal: Positive for abdominal pain and constipation. Negative for nausea, vomiting, diarrhea, blood in stool, abdominal distention, anal bleeding and rectal pain.  Genitourinary: Negative for difficulty urinating.  Musculoskeletal: Positive for joint  swelling and arthralgias.    Health Maintenance  Topic Date Due  . FOOT EXAM  07/25/1950  . TETANUS/TDAP  07/26/1959  . ZOSTAVAX  07/25/2000  . OPHTHALMOLOGY EXAM  07/22/2015  . HEMOGLOBIN A1C  03/20/2016  . INFLUENZA VACCINE  06/10/2016  . COLONOSCOPY  07/21/2022  . DEXA SCAN  Completed  . PNA vac Low Risk Adult  Completed    Physical Exam: Filed Vitals:   12/31/15 1454  BP: 130/84  Pulse: 66  Temp: 97.6 F (36.4 C)  TempSrc: Oral  Height:  (1.499 m)  Weight: 182 lb (82.555 kg)  SpO2: 92%   Body mass index is 36.74 kg/(m^2). Physical Exam  Cardiovascular: Normal rate and regular rhythm.   Pulmonary/Chest: Effort normal and breath sounds normal.  Abdominal: Soft. Bowel sounds are normal. She exhibits no distension and no mass. There is tenderness. There is no rebound and no guarding.  Left lower quadrant mildly tender (palpable hard stool)  Musculoskeletal: She exhibits edema and tenderness.  Left medial knee  Neurological: She is alert.    Labs reviewed: Basic Metabolic Panel:  Recent Labs  09/05/24 1534 06/21/15 1422 09/21/15 1207  NA 139 142 144  K 4.5 4.1 4.2  CL 98 100 102  CO2 GLUCOSE 87 123* 83  BUN CREATININE 0.86 0.75 0.82  CALCIUM 10.0 9.6 10.7*   Liver Function Tests:  Recent  Labs  03/12/15 1534 06/21/15 1422 09/21/15 1207  AST 18 15 14   ALT 14 14 10   ALKPHOS 106 87 127*  BILITOT <0.2 <0.2 0.3  PROT 7.6 7.2 7.4  ALBUMIN 4.2 4.2 4.1   No results for input(s): LIPASE, AMYLASE in the last 8760 hours. No results for input(s): AMMONIA in the last 8760 hours. CBC:  Recent Labs  03/12/15 1534 06/21/15 1422 09/21/15 1207  WBC 11.7* 9.7 10.0  NEUTROABS 4.1 3.5 4.0  HCT 38.7 37.3 39.0  MCV 88 88 88  PLT 229 239 212   Lipid Panel:  Recent Labs  09/21/15 1207  CHOL 145  HDL 45  LDLCALC 74  TRIG 132  CHOLHDL 3.2   Lab Results  Component Value Date   HGBA1C 6.4* 09/21/2015    Procedures since  last visit: US Abdomen Limited  12/10/2015  CLINICAL DATA:  Left lower abdominal pain. EXAM: LIMITED ABDOMINAL ULTRASOUND COMPARISON:  None. FINDINGS: Left lower abdomen evaluated with ultrasound. No soft tissue mass or free fluid seen. No fluid collection. Left kidney appears normal in configuration without hydronephrosis. Portions of the left lower abdomen obscured by overlying stool and bowel gas. IMPRESSION: No sonographic abnormality identified within the left lower abdomen, with mild study limitations detailed above. Electronically Signed   By: Bary Richard M.D.   On: 12/10/2015 09:36    Assessment/Plan 1. Primary osteoarthritis of left knee -after consent was obtained, left knee was cleansed with betadine, sprayed with topical numbing cooling spray and injected with 1cc 40mg  depomedrol with 1cc 1% lidocaine--pt noted relief, bandaid applied to injection site  2. 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 Abdominal pain, left lower quadrant -due to #4, I suspect  4. Chronic constipation -given miralax packets to use one daily until she has regular bms, then reduce to as needed  Next appt:  Keep regular visit  Amanda Adkins L. Lesette Frary, D.O. Geriatrics [facility] [address] [place], [place] [address] Cell Phone (Mon-Fri 8am-5pm):  [phone] On Call:  [phone] & follow prompts after 5pm & weekends Office Phone:  [phone] Office Fax:  [phone]

## 2016-01-01 ENCOUNTER — Other Ambulatory Visit: Payer: Self-pay | Admitting: Internal Medicine

## 2016-01-04 ENCOUNTER — Ambulatory Visit: Payer: Medicare Other | Admitting: Internal Medicine

## 2016-01-08 ENCOUNTER — Ambulatory Visit: Payer: Medicare Other | Admitting: Podiatry

## 2016-01-25 ENCOUNTER — Other Ambulatory Visit: Payer: Medicare Other

## 2016-01-25 DIAGNOSIS — D72829 Elevated white blood cell count, unspecified: Secondary | ICD-10-CM | POA: Diagnosis not present

## 2016-01-26 LAB — URINALYSIS
Bilirubin, UA: NEGATIVE
Glucose, UA: NEGATIVE
Ketones, UA: NEGATIVE
Leukocytes, UA: NEGATIVE
Nitrite, UA: NEGATIVE
Protein, UA: NEGATIVE
RBC, UA: NEGATIVE
Specific Gravity, UA: 1.012 (ref 1.005–1.030)
Urobilinogen, Ur: 0.2 mg/dL (ref 0.2–1.0)
pH, UA: 6 (ref 5.0–7.5)

## 2016-01-26 LAB — URINE CULTURE

## 2016-01-29 ENCOUNTER — Telehealth: Payer: Self-pay | Admitting: *Deleted

## 2016-01-29 NOTE — Telephone Encounter (Signed)
The wandering is a new thing.  The rest is Amanda Adkins.  She probably is not taking her meds right.  She won't agree to stay in SNF and does not have funds for ALF.  We can do an FL-2.  [facility] time [facility] got [facility]  She really needs a guardian b/c her family has never been supportive.

## 2016-01-29 NOTE — Telephone Encounter (Signed)
Amanda CanningLaNay Adkins with [facility] called and stated that they are going to need a FL2 form for placement. Stated that [facility] called them stating that patient is having Increased Confusion, wandering the streets and for patient's safety they need to place her in a facility with 24hr care. Informed Amanda RuaLaNay that patient had an appointment on Friday and we could complete FL2 form at that time and she agreed.   Amanda Adkins with [facility] also called and stated that they called [facility] due to Increase Confusion, not sure if patient is taking her medications right, Mood has worsened, Wandering the streets and not a lot of cooperation from patient's family. Patient keeps telling the [facility] aides to leave and patient is alone too long. [facility] is seeking placement for patient and needs FL2 Form Completed. Patient has an appointment on Friday with Dr. Renato Gailseed. Added to notes to complete at appointment.

## 2016-01-30 ENCOUNTER — Other Ambulatory Visit: Payer: Self-pay | Admitting: Internal Medicine

## 2016-02-01 ENCOUNTER — Encounter: Payer: Self-pay | Admitting: Internal Medicine

## 2016-02-01 ENCOUNTER — Ambulatory Visit (INDEPENDENT_AMBULATORY_CARE_PROVIDER_SITE_OTHER): Payer: Medicare Other | Admitting: Internal Medicine

## 2016-02-01 VITALS — BP 130/60 | HR 78 | Temp 98.6°F | Ht 59.0 in | Wt 188.0 lb

## 2016-02-01 DIAGNOSIS — E1121 Type 2 diabetes mellitus with diabetic nephropathy: Secondary | ICD-10-CM | POA: Diagnosis not present

## 2016-02-01 DIAGNOSIS — F22 Delusional disorders: Secondary | ICD-10-CM | POA: Diagnosis not present

## 2016-02-01 DIAGNOSIS — F0390 Unspecified dementia without behavioral disturbance: Secondary | ICD-10-CM | POA: Diagnosis not present

## 2016-02-01 DIAGNOSIS — K8689 Other specified diseases of pancreas: Secondary | ICD-10-CM

## 2016-02-01 DIAGNOSIS — J449 Chronic obstructive pulmonary disease, unspecified: Secondary | ICD-10-CM | POA: Diagnosis not present

## 2016-02-01 DIAGNOSIS — Z609 Problem related to social environment, unspecified: Secondary | ICD-10-CM | POA: Diagnosis not present

## 2016-02-01 DIAGNOSIS — M1712 Unilateral primary osteoarthritis, left knee: Secondary | ICD-10-CM | POA: Diagnosis not present

## 2016-02-01 DIAGNOSIS — N3942 Incontinence without sensory awareness: Secondary | ICD-10-CM

## 2016-02-01 DIAGNOSIS — E1149 Type 2 diabetes mellitus with other diabetic neurological complication: Secondary | ICD-10-CM | POA: Diagnosis not present

## 2016-02-01 DIAGNOSIS — B372 Candidiasis of skin and nail: Secondary | ICD-10-CM | POA: Diagnosis not present

## 2016-02-01 DIAGNOSIS — Z659 Problem related to unspecified psychosocial circumstances: Secondary | ICD-10-CM

## 2016-02-01 DIAGNOSIS — L409 Psoriasis, unspecified: Secondary | ICD-10-CM | POA: Diagnosis not present

## 2016-02-01 DIAGNOSIS — F0392 Unspecified dementia, unspecified severity, with psychotic disturbance: Secondary | ICD-10-CM

## 2016-02-01 LAB — POCT URINALYSIS DIPSTICK
Bilirubin, UA: NEGATIVE
Blood, UA: NEGATIVE
Glucose, UA: NEGATIVE
Ketones, UA: NEGATIVE
Leukocytes, UA: NEGATIVE
Nitrite, UA: NEGATIVE
Protein, UA: NEGATIVE
Spec Grav, UA: 1.015
Urobilinogen, UA: NEGATIVE
pH, UA: 6

## 2016-02-01 MED ORDER — IPRATROPIUM BROMIDE HFA 17 MCG/ACT IN AERS: 2.0000 | INHALATION_SPRAY | Freq: Four times a day (QID) | RESPIRATORY_TRACT | Status: DC | PRN
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
## 2016-02-02 LAB — CBC WITH DIFFERENTIAL/PLATELET
Basophils Absolute: 0 10*3/uL (ref 0.0–0.2)
Basos: 0 %
EOS (ABSOLUTE): 0.4 10*3/uL (ref 0.0–0.4)
Eos: 5 %
Hematocrit: 37.1 % (ref 34.0–46.6)
Hemoglobin: 11.9 g/dL (ref 11.1–15.9)
Immature Grans (Abs): 0 10*3/uL (ref 0.0–0.1)
Immature Granulocytes: 0 %
Lymphocytes Absolute: 4 10*3/uL — ABNORMAL HIGH (ref 0.7–3.1)
Lymphs: 43 %
MCH: 28.7 pg (ref 26.6–33.0)
MCHC: 32.1 g/dL (ref 31.5–35.7)
MCV: 90 fL (ref 79–97)
Monocytes Absolute: 0.5 10*3/uL (ref 0.1–0.9)
Monocytes: 5 %
Neutrophils Absolute: 4.4 10*3/uL (ref 1.4–7.0)
Neutrophils: 47 %
Platelets: 230 10*3/uL (ref 150–379)
RBC: 4.14 x10E6/uL (ref 3.77–5.28)
RDW: 16 % — ABNORMAL HIGH (ref 12.3–15.4)
WBC: 9.4 10*3/uL (ref 3.4–10.8)

## 2016-02-02 LAB — HEMOGLOBIN A1C
Est. average glucose Bld gHb Est-mCnc: 131 mg/dL
Hgb A1c MFr Bld: 6.2 % — ABNORMAL HIGH (ref 4.8–5.6)

## 2016-02-02 LAB — COMPREHENSIVE METABOLIC PANEL
ALT: 8 IU/L (ref 0–32)
AST: 13 IU/L (ref 0–40)
Albumin/Globulin Ratio: 1.3 (ref 1.2–2.2)
Albumin: 4.1 g/dL (ref 3.5–4.8)
Alkaline Phosphatase: 111 IU/L (ref 39–117)
BUN/Creatinine Ratio: 26 (ref 11–26)
BUN: 18 mg/dL (ref 8–27)
Bilirubin Total: 0.2 mg/dL (ref 0.0–1.2)
CO2: 26 mmol/L (ref 18–29)
Calcium: 9.8 mg/dL (ref 8.7–10.3)
Chloride: 101 mmol/L (ref 96–106)
Creatinine, Ser: 0.68 mg/dL (ref 0.57–1.00)
GFR calc Af Amer: 99 mL/min/{1.73_m2} (ref >59)
GFR calc non Af Amer: 86 mL/min/{1.73_m2} (ref >59)
Globulin, Total: 3.2 g/dL (ref 1.5–4.5)
Glucose: 83 mg/dL (ref 65–99)
Potassium: 4.3 mmol/L (ref 3.5–5.2)
Sodium: 142 mmol/L (ref 134–144)
Total Protein: 7.3 g/dL (ref 6.0–8.5)

## 2016-02-03 LAB — URINE CULTURE

## 2016-02-04 ENCOUNTER — Telehealth: Payer: Self-pay

## 2016-02-04 NOTE — Telephone Encounter (Signed)
Amanda Adkins came into the office left message, wondering if Dr. Renato Gailseed could admit Amanda Adkins to the hospital, she is wandering off from home and memory loss. They want placed in hospital to have 24 hour care until a facility placement comes available. Per Dr. Renato Gailseed she can not admit to hospital unless it is a medical reason. Called back and spoke with Amanda Adkins grandaugher, Dahlia Adkins at work. Someone had suggest this, thanks for calling.

## 2016-02-05 ENCOUNTER — Ambulatory Visit: Payer: Medicare Other | Admitting: Podiatry

## 2016-02-05 ENCOUNTER — Telehealth: Payer: Self-pay | Admitting: *Deleted

## 2016-02-05 NOTE — Telephone Encounter (Signed)
French Anaracy with [facility] called and requested verbal orders to recert patient for another 60 days.  Verbal order given.

## 2016-02-11 ENCOUNTER — Telehealth: Payer: Self-pay | Admitting: *Deleted

## 2016-02-11 NOTE — Telephone Encounter (Signed)
[facility] wanted [facility] about [facility]'s FL2 form.  I spoke with Dr. Renato Gailseed and she stated that it was discussed at patient's last appointment and will be completed by Thursday.  Ms [name] notified.

## 2016-02-14 DIAGNOSIS — E119 Type 2 diabetes mellitus without complications: Secondary | ICD-10-CM

## 2016-02-14 DIAGNOSIS — F0281 Dementia in other diseases classified elsewhere with behavioral disturbance: Secondary | ICD-10-CM

## 2016-02-14 DIAGNOSIS — G309 Alzheimer's disease, unspecified: Secondary | ICD-10-CM

## 2016-02-14 DIAGNOSIS — R32 Unspecified urinary incontinence: Secondary | ICD-10-CM

## 2016-02-15 NOTE — Telephone Encounter (Signed)
Social worker Lanae calling asking if FL-2 form is complete? Please advise

## 2016-02-16 NOTE — Telephone Encounter (Signed)
It was done.  Unfortunately, I didn't put it in the fax pile Friday.  We can send it Monday.  Pt is not going to stay in a facility when placed, but we can try.

## 2016-02-18 NOTE — Telephone Encounter (Signed)
FL2 Form Completed and faxed to [name] Fax#: [phone].

## 2016-02-21 ENCOUNTER — Telehealth: Payer: Self-pay

## 2016-02-21 NOTE — Telephone Encounter (Signed)
Received a call that a recently faxed FL2 form for placement had box marked incorrectly. Social worker would like to know if she can come by and get the formed corrected today. She says that there is an urgent need to have the form completed today.    Please advise.

## 2016-02-21 NOTE — Telephone Encounter (Signed)
I'm reading this at 4:55pm.  Which box is wrong anyway?

## 2016-02-25 NOTE — Telephone Encounter (Signed)
[facility] [place] [phone] [facility] to [facility]'s wandering, [address]. Given to Dr. Renato Gailseed to review and initial. To be faxed back to Fax#: [phone]

## 2016-02-26 ENCOUNTER — Other Ambulatory Visit: Payer: Self-pay | Admitting: Internal Medicine

## 2016-02-28 ENCOUNTER — Other Ambulatory Visit: Payer: Self-pay | Admitting: Internal Medicine

## 2016-03-05 ENCOUNTER — Telehealth: Payer: Self-pay | Admitting: *Deleted

## 2016-03-05 NOTE — Telephone Encounter (Signed)
Amanda Adkins dropped off copy of FL2 form stating that Amanda Adkins is needing only the current medication list and treatment of patient. She stated that they do not need past medication or diagnosis just current. She also stated that she needed the original FL2 Form to pick up to [facility].  I called Amanda Adkins (Resident Care Director) [phone] and she stated that the list of medications we provided and the medication she brought in did not match. She also stated that the Diagnosis only needed to be listed for her main Diagnosis.  I called Amanda Adkins [phone], after talking with Amanda Adkins, and we agree that patient needs an appointment and bring in all her medications to fill out another FL2 Form. LMOM for Amanda to return call to schedule an appointment.

## 2016-03-11 ENCOUNTER — Other Ambulatory Visit: Payer: Self-pay | Admitting: *Deleted

## 2016-03-11 ENCOUNTER — Other Ambulatory Visit: Payer: Self-pay | Admitting: Internal Medicine

## 2016-03-11 ENCOUNTER — Telehealth: Payer: Self-pay

## 2016-03-11 MED ORDER — TRAMADOL HCL 50 MG PO TABS: ORAL_TABLET | ORAL | Status: DC

## 2016-03-11 MED ORDER — DIAZEPAM 2 MG PO TABS: 2.0000 mg | ORAL_TABLET | Freq: Three times a day (TID) | ORAL | Status: DC | PRN

## 2016-03-11 NOTE — Telephone Encounter (Signed)
Spoke with Lanae from Department of Social Services, Lanae stressed that we need to contact [facility] @ [phone] (Speak with Burnard LeighMary Dorkin) to orchestrate placement for the patient.  I called Burnard LeighMary Dorkin and she re-stated what is mentioned in phone notes dated 02/11/16 and 02/21/16, medication list and problem list needs to be updated and precise according to patient's medication bottles. I informed Corrie DandyMary that we have reached out to the patient's grand-daughter for an appointment to resolve these issues concerning FL2

## 2016-03-11 NOTE — Telephone Encounter (Signed)
I called Amanda Adkins and requested that she have Amanda Adkins (grand-daughter) give Amanda Adkins a call.

## 2016-03-11 NOTE — Telephone Encounter (Signed)
Spoke with Amanda Adkins. Patient with pending appointment for Monday 03-17-16, I offered for patient to be seen this Thursday @ 3:30 pm, patient unable to come in Thursday due to caregivers schedule. Amanda Adkins aware patient needs to bring all medication bottles.   I called [name] back to give her a status update, left message on voicemail.

## 2016-03-13 ENCOUNTER — Ambulatory Visit: Payer: Self-pay | Admitting: Internal Medicine

## 2016-03-13 NOTE — Telephone Encounter (Signed)
Patient has an appointment on 03/17/2016. Paperwork placed in CMA cabinet.

## 2016-03-17 ENCOUNTER — Ambulatory Visit (INDEPENDENT_AMBULATORY_CARE_PROVIDER_SITE_OTHER): Payer: Medicare Other | Admitting: Internal Medicine

## 2016-03-17 ENCOUNTER — Encounter: Payer: Self-pay | Admitting: Internal Medicine

## 2016-03-17 VITALS — BP 120/80 | HR 91 | Temp 98.7°F | Ht 59.0 in | Wt 185.0 lb

## 2016-03-17 DIAGNOSIS — F0390 Unspecified dementia without behavioral disturbance: Secondary | ICD-10-CM | POA: Diagnosis not present

## 2016-03-17 DIAGNOSIS — F0150 Vascular dementia without behavioral disturbance: Secondary | ICD-10-CM | POA: Diagnosis not present

## 2016-03-17 DIAGNOSIS — K219 Gastro-esophageal reflux disease without esophagitis: Secondary | ICD-10-CM | POA: Diagnosis not present

## 2016-03-17 DIAGNOSIS — K8689 Other specified diseases of pancreas: Secondary | ICD-10-CM

## 2016-03-17 DIAGNOSIS — G8929 Other chronic pain: Secondary | ICD-10-CM | POA: Diagnosis not present

## 2016-03-17 DIAGNOSIS — M25512 Pain in left shoulder: Secondary | ICD-10-CM | POA: Diagnosis not present

## 2016-03-17 DIAGNOSIS — Z111 Encounter for screening for respiratory tuberculosis: Secondary | ICD-10-CM | POA: Diagnosis not present

## 2016-03-17 DIAGNOSIS — I10 Essential (primary) hypertension: Secondary | ICD-10-CM

## 2016-03-17 DIAGNOSIS — F0392 Unspecified dementia, unspecified severity, with psychotic disturbance: Secondary | ICD-10-CM

## 2016-03-17 DIAGNOSIS — M170 Bilateral primary osteoarthritis of knee: Secondary | ICD-10-CM | POA: Diagnosis not present

## 2016-03-17 DIAGNOSIS — E1142 Type 2 diabetes mellitus with diabetic polyneuropathy: Secondary | ICD-10-CM | POA: Diagnosis not present

## 2016-03-17 DIAGNOSIS — M25511 Pain in right shoulder: Secondary | ICD-10-CM | POA: Diagnosis not present

## 2016-03-17 DIAGNOSIS — Z720 Tobacco use: Secondary | ICD-10-CM | POA: Diagnosis not present

## 2016-03-17 NOTE — Progress Notes (Signed)
[facility]  [facility] Provider:  Yousaf Sainato L. Renato Adkins, D.O., C.M.D.  Code Status: DNR Goals of Care:  Advanced Directives 03/17/2016  Does patient have an advance directive? No  Would patient like information on creating an advanced directive? No - patient declined information   Chief Complaint  Patient presents with  . Medical Management of Chronic Issues    6 week follow-up, granddaughter Amanda Adkins    HPI: Patient is a 75 y.o. female seen today for medical management of chronic diseases (6 wk f/u after visit for FL-2 completion).    Amanda DandyMary says she does not know what's going on.  Her granddaughter is trying to get her into assisted living.  The ALF apparently thought her problems and meds were not current b/c of the extensive list of problems and meds.  We went through all of the pills, creams, capsules, ointments, etc. That she had with her today against our list in this system and then matched the list to [facility] which was up to date as of that date and I have not changed her meds since.  Over the years, I have given multiple lists with indications, contacted people filling her pills and her mail order pharmacy to keep it straight.  Many of the creams and ointments she is prescribed are not being used b/c pt does not know/remember how to use them and lives by herself w/o help.    Past Medical History  Diagnosis Date  . Diabetes mellitus without complication (HCC)   . Anxiety   . GERD (gastroesophageal reflux disease)   . Vitamin B12 deficiency   . Hyperlipidemia   . Tobacco use disorder   . Posttraumatic stress disorder   . Hypertension   . Allergy   . Allergic rhinitis due to pollen   . Osteoporosis   . Other malaise and fatigue   . Abnormality of gait   . Hepatomegaly   . History of fall   . Muscle weakness (generalized)   . Diarrhea   . Unspecified constipation   . Abdominal pain, generalized   . Kyphosis (acquired) (postural)   . Posttraumatic stress  disorder   . Other psoriasis   . Abnormality of gait   . Other B-complex deficiencies   . Dementia in conditions classified elsewhere without behavioral disturbance   . Pain   . Other malaise and fatigue   . Personal history of fall   . Hypercalcemia   . Senile osteoporosis   . Cervicitis and endocervicitis   . Unspecified pruritic disorder   . Closed dislocation of shoulder, unspecified site   . Leukocytosis, unspecified   . Other specified pre-operative examination   . Abdominal pain, generalized   . Tinnitus   . Pain in joint, shoulder region   . Pain in joint, lower leg   . Impacted cerumen   . Diarrhea   . Hypopotassemia   . Diverticulosis of colon (without mention of hemorrhage)   . Tobacco use disorder   . Reflux esophagitis   . Cough   . Abnormality of gait   . Acute bronchitis   . 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 Hepatomegaly   . Dermatitis   . Senile dementia, uncomplicated   . Acute sinusitis, unspecified   . Type II or unspecified type diabetes mellitus without mention of complication, uncontrolled   . Contact with or exposure to other communicable diseases(V01.89)   .  Candidiasis of vulva and vagina   . Type II or unspecified type diabetes mellitus without mention of complication, not stated as uncontrolled   . Other and unspecified hyperlipidemia   . Anxiety state, unspecified   . Unspecified essential hypertension   . Allergic rhinitis due to pollen   . Pain in joint, site unspecified   . Stiffness of joint, not elsewhere classified, unspecified site   . Abdominal pain, unspecified site   . Vascular dementia   . Shoulder dislocation   . Psoriasis   . Constipation, chronic   . Senile osteoporosis   . Osteoarthritis of both knees   . Hearing loss sensory, bilateral   . Tobacco abuse   . Chronic pain of both shoulders     Past Surgical History  Procedure Laterality Date  .  Bladder surgery    . Spine surgery    . Orif patella Left 04/08/2013    Procedure: OPEN REDUCTION INTERNAL (ORIF) FIXATION PATELLA;  Surgeon: Eldred Manges, MD;  Location: [facility] OR;  Service: Orthopedics;  Laterality: Left;  Open Reduction Internal Fixation Left Patella Fracture    Allergies  Allergen Reactions  . Buspar [Buspirone] Hives  . Percocet [Oxycodone-Acetaminophen]     Hallucination  . Vicodin [Hydrocodone-Acetaminophen]     hallucination      Medication List       This list is accurate as of: 03/17/16 11:21 AM.  Always use your most recent med list.               ACCU-CHEK AVIVA PLUS test strip  Generic drug:  glucose blood  USE AS INSTRUCTED TO TEST BLOOD SUGAR     ACCU-CHEK FASTCLIX LANCETS Misc  CHECK BLOOD SUGAR EVERY DAY     acetaminophen 500 MG tablet  Commonly known as:  TYLENOL  Take 2 tablets (1,000 mg total) by mouth 3 (three) times daily. For arthritis pain     atenolol 50 MG tablet  Commonly known as:  TENORMIN  TAKE 1 TABLET BY MOUTH EVERY DAY     atorvastatin 10 MG tablet  Commonly known as:  LIPITOR  TAKE ONE TABLET BY MOUTH DAILY.     Calcipotriene 0.005 % solution  APPLY TO SCALP DAILY     CREON 36000 UNITS Cpep capsule  Generic drug:  lipase/protease/amylase  TAKE 1 CAPSULE BY MOUTH THREE TIMES A DAY BEFORE MEALS     diazepam 2 MG tablet  Commonly known as:  VALIUM  Take 1 tablet (2 mg total) by mouth every 8 (eight) hours as needed. for anxiety     dicyclomine 10 MG capsule  Commonly known as:  BENTYL  TAKE 1 CAPSULE BY MOUTH 30 MINUTES BEFORE MEALS AND AT BEDTIME.     donepezil 10 MG tablet  Commonly known as:  ARICEPT  TAKE 1 TABLET BY MOUTH EVERY NIGHT AT BEDTIME     DULoxetine 30 MG capsule  Commonly known as:  CYMBALTA  TAKE 1 CAPSULE BY MOUTH DAILY     esomeprazole 40 MG capsule  Commonly known as:  NEXIUM  TAKE 1 CAPSULE BY MOUTH DAILY AT 12PM NOON     fluocinolone 0.025 % ointment  Commonly known as:  SYNALAR    APPLY TO AFFECTED AREA ON ARMS     furosemide 20 MG tablet  Commonly known as:  LASIX  TAKE 1 TABLET BY MOUTH EVERY DAY ** STOP HYDROCHLOROTHIAZIDE     gabapentin 300 MG capsule  Commonly known as:  NEURONTIN  TAKE 1 CAPSULE BY MOUTH EVERY DAY FOR NERVE PAIN     hydrocortisone 2.5 % lotion  APPLY TO NECK AREA TWICE DAILY AS NEEDED FOR ITCHY RASH     ipratropium 17 MCG/ACT inhaler  Commonly known as:  ATROVENT HFA  Inhale 2 puffs into the lungs every 6 (six) hours as needed for wheezing.     metFORMIN 500 MG tablet  Commonly known as:  GLUCOPHAGE  TAKE 1 TABLET BY MOUTH TWICE DAILY WITH A MEAL FOR DIABETES     NAMENDA XR 28 MG Cp24 24 hr capsule  Generic drug:  memantine  TAKE 1 CAPSULE BY MOUTH DAILY     NON FORMULARY  Formula 1117 cream, 360 grams, with 5 refills.   Use as directed for knee pain     nystatin cream  Commonly known as:  MYCOSTATIN  APPLY TO AFFECTED AREA TWICE DAILY.     potassium chloride SA 20 MEQ tablet  Commonly known as:  K-DUR,KLOR-CON  TAKE 1 TABLET BY MOUTH 2 TIMES A DAY     PROAIR HFA 108 (90 Base) MCG/ACT inhaler  Generic drug:  albuterol  INHALE 2 PUFFS UP TO 4 TIMES DAILY AS NEEDED TO HELP BREATHING/WHEEZING/COUGH     ramipril 10 MG capsule  Commonly known as:  ALTACE  TAKE 1 CAPSULE BY MOUTH EVERY DAY FOR BLOOD PRESSURE     risperiDONE 0.25 MG tablet  Commonly known as:  RISPERDAL  TAKE 1 TABLET BY MOUTH EVERY NIGHT AT BEDTIME     sucralfate 1 g tablet  Commonly known as:  CARAFATE  TAKE 1 TABLET BY MOUTH BEFORE MEALS AND AT BEDTIME TO PROTECT STOMACH     traMADol 50 MG tablet  Commonly known as:  ULTRAM  TAKE 1 TABLET BY MOUTH EVERY 8 HOURS AS NEEDED FOR SEVERE PAIN (IN KNEE AND ABDOMEN)     traZODone 150 MG tablet  Commonly known as:  DESYREL  TAKE 1 TABLET BY MOUTH NIGHTLY TO HELP WITH NERVES AND REST        Review of Systems:  Review of Systems  Constitutional: Positive for malaise/fatigue. Negative for fever and  chills.  HENT: Negative for congestion.   Eyes: Negative for blurred vision.       Glasses  Respiratory: Positive for cough, sputum production and wheezing. Negative for shortness of breath.   Cardiovascular: Negative for chest pain, palpitations and leg swelling.  Gastrointestinal: Positive for heartburn and constipation. Negative for abdominal pain, blood in stool and melena.  Genitourinary: Positive for urgency and frequency. Negative for dysuria.       Negative for UTI last visit, has incontinence  Musculoskeletal: Positive for joint pain. Negative for falls.  Skin: Positive for itching and rash.       Psoriasis of scalp, posterior neck, posterior arms, candidal skin infection in skin folds  Neurological: Positive for weakness. Negative for dizziness, loss of consciousness and headaches.  Endo/Heme/Allergies: Does not bruise/bleed easily.  Psychiatric/Behavioral: Positive for depression and memory loss. The patient is nervous/anxious.        Paranoid delusions--says family is taking her money    Health Maintenance  Topic Date Due  . Janet Berlin  07/26/1960  . ZOSTAVAX  07/25/2000  . OPHTHALMOLOGY EXAM  07/22/2015  . INFLUENZA VACCINE  06/10/2016  . HEMOGLOBIN A1C  08/03/2016  . FOOT EXAM  09/10/2016  . COLONOSCOPY  07/21/2022  . DEXA SCAN  Completed  . PNA vac Low Risk Adult  Completed    Physical  Exam: Filed Vitals:   03/17/16 1104  BP: 120/80  Pulse: 91  Temp: 98.7 F (37.1 C)  TempSrc: Oral  Height: 4\' 11"  (1.499 m)  Weight: 185 lb (83.915 kg)  SpO2: 96%   Body mass index is 37.35 kg/(m^2). Physical Exam  Constitutional: No distress.  Cardiovascular: Normal rate, regular rhythm, normal heart sounds and intact distal pulses.   Pulmonary/Chest: Effort normal.  Rhonchi, exp wheezes  Abdominal: Soft. Bowel sounds are normal.  Musculoskeletal: Normal range of motion. She exhibits tenderness.  Of lower back and knees; is to be using walker to ambulate at all times   Neurological: She is alert.  Skin: Skin is warm and dry.  Scaly plaques on scalp and erythema/moisture beneath abdominal skin folds  Psychiatric: She has a normal mood and affect.    Labs reviewed: Basic Metabolic Panel:  Recent Labs  16/10/96 1422 09/21/15 1207 02/01/16 1107  NA 142 144 142  K 4.1 4.2 4.3  CL 100 102 101  CO2 26 25 26   GLUCOSE 123* 83 83  BUN 13 20 18   CREATININE 0.75 0.82 0.68  CALCIUM 9.6 10.7* 9.8   Liver Function Tests:  Recent Labs  06/21/15 1422 09/21/15 1207 02/01/16 1107  AST 15 14 13   ALT 14 10 8   ALKPHOS 87 127* 111  BILITOT <0.2 0.3 0.2  PROT 7.2 7.4 7.3  ALBUMIN 4.2 4.1 4.1   No results for input(s): LIPASE, AMYLASE in the last 8760 hours. No results for input(s): AMMONIA in the last 8760 hours. CBC:  Recent Labs  06/21/15 1422 09/21/15 1207 02/01/16 1107  WBC 9.7 10.0 9.4  NEUTROABS 3.5 4.0 4.4  HCT 37.3 39.0 37.1  MCV 88 88 90  PLT 239 212 230   Lipid Panel:  Recent Labs  09/21/15 1207  CHOL 145  HDL 45  LDLCALC 74  TRIG 132  CHOLHDL 3.2   Lab Results  Component Value Date   HGBA1C 6.2* 02/01/2016    Assessment/Plan 1. Essential hypertension, benign -bp at goal with her current regimen with atenolol, lasix with potassium, ramipril only  2. Gastroesophageal reflux disease without esophagitis -stable, cont on nexium before meal and carafate  3. Controlled type 2 diabetes mellitus with diabetic polyneuropathy, without long-term current use of insulin (HCC) -has been well controlled -cont lipitor, ramipril, metformin 500mg  po bid -suspect she is not getting regular meals at home--weights fluctuate and unable to prepare a good meal for herself  4. Vascular dementia, without behavioral disturbance -cont namenda and aricept--change to namzaric if covered by insurance when in ALF  5. 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 Primary osteoarthritis of both knees -cont tylenol for mild pain, tramadol for more severe pain  7. Tobacco abuse -ongoing--says she smokes on the patio -goes through spells where she is not smoking until family buy her cigarettes again  8. Pancreatic insufficiency (HCC) -cont on creon for the diarrhea she has from her diabetes  9. Chronic pain of both shoulders -cont tylenol and tramadol for pain -avoid stronger narcotics which will worsen confusion and delusions  Has probable COPD, but no PFTs--cont albuterol inhaler as needed for wheezing and sob (proair and ventolin and atrovent are the same)  Labs/tests ordered:   Orders Placed This Encounter  Procedures  . TB Skin Test    Order Specific Question:  Has patient ever tested positive?    Answer:  No  return Wednesday for CMA visit to read PPD  Next appt:  3 mos med mgt  Latrese Carolan L. Freddy Spadafora, D.O. Geriatrics [facility] [address], [place] [address] Cell Phone (Mon-Fri 8am-5pm):  [phone] On Call:  [phone] & follow prompts after 5pm & weekends Office Phone:  [phone] Office Fax:  [phone]

## 2016-03-20 LAB — TB SKIN TEST
Induration: 0 mm
TB Skin Test: NEGATIVE

## 2016-03-26 DIAGNOSIS — E119 Type 2 diabetes mellitus without complications: Secondary | ICD-10-CM | POA: Diagnosis not present

## 2016-03-26 DIAGNOSIS — K219 Gastro-esophageal reflux disease without esophagitis: Secondary | ICD-10-CM | POA: Diagnosis not present

## 2016-03-26 DIAGNOSIS — I10 Essential (primary) hypertension: Secondary | ICD-10-CM | POA: Diagnosis not present

## 2016-03-28 DIAGNOSIS — E11618 Type 2 diabetes mellitus with other diabetic arthropathy: Secondary | ICD-10-CM | POA: Diagnosis not present

## 2016-03-28 DIAGNOSIS — I10 Essential (primary) hypertension: Secondary | ICD-10-CM | POA: Diagnosis not present

## 2016-03-28 DIAGNOSIS — Z87891 Personal history of nicotine dependence: Secondary | ICD-10-CM | POA: Diagnosis not present

## 2016-03-28 DIAGNOSIS — E785 Hyperlipidemia, unspecified: Secondary | ICD-10-CM | POA: Diagnosis not present

## 2016-03-28 DIAGNOSIS — Z79891 Long term (current) use of opiate analgesic: Secondary | ICD-10-CM | POA: Diagnosis not present

## 2016-03-28 DIAGNOSIS — L409 Psoriasis, unspecified: Secondary | ICD-10-CM | POA: Diagnosis not present

## 2016-03-28 DIAGNOSIS — Z7984 Long term (current) use of oral hypoglycemic drugs: Secondary | ICD-10-CM | POA: Diagnosis not present

## 2016-03-28 DIAGNOSIS — E119 Type 2 diabetes mellitus without complications: Secondary | ICD-10-CM | POA: Diagnosis not present

## 2016-03-28 DIAGNOSIS — M170 Bilateral primary osteoarthritis of knee: Secondary | ICD-10-CM | POA: Diagnosis not present

## 2016-03-28 DIAGNOSIS — J449 Chronic obstructive pulmonary disease, unspecified: Secondary | ICD-10-CM | POA: Diagnosis not present

## 2016-03-31 DIAGNOSIS — Z7984 Long term (current) use of oral hypoglycemic drugs: Secondary | ICD-10-CM | POA: Diagnosis not present

## 2016-03-31 DIAGNOSIS — L409 Psoriasis, unspecified: Secondary | ICD-10-CM | POA: Diagnosis not present

## 2016-03-31 DIAGNOSIS — E785 Hyperlipidemia, unspecified: Secondary | ICD-10-CM | POA: Diagnosis not present

## 2016-03-31 DIAGNOSIS — M170 Bilateral primary osteoarthritis of knee: Secondary | ICD-10-CM | POA: Diagnosis not present

## 2016-03-31 DIAGNOSIS — Z79891 Long term (current) use of opiate analgesic: Secondary | ICD-10-CM | POA: Diagnosis not present

## 2016-03-31 DIAGNOSIS — J449 Chronic obstructive pulmonary disease, unspecified: Secondary | ICD-10-CM | POA: Diagnosis not present

## 2016-03-31 DIAGNOSIS — E11618 Type 2 diabetes mellitus with other diabetic arthropathy: Secondary | ICD-10-CM | POA: Diagnosis not present

## 2016-03-31 DIAGNOSIS — I10 Essential (primary) hypertension: Secondary | ICD-10-CM | POA: Diagnosis not present

## 2016-03-31 DIAGNOSIS — Z87891 Personal history of nicotine dependence: Secondary | ICD-10-CM | POA: Diagnosis not present

## 2016-04-02 DIAGNOSIS — E785 Hyperlipidemia, unspecified: Secondary | ICD-10-CM | POA: Diagnosis not present

## 2016-04-02 DIAGNOSIS — J449 Chronic obstructive pulmonary disease, unspecified: Secondary | ICD-10-CM | POA: Diagnosis not present

## 2016-04-02 DIAGNOSIS — Z87891 Personal history of nicotine dependence: Secondary | ICD-10-CM | POA: Diagnosis not present

## 2016-04-02 DIAGNOSIS — M170 Bilateral primary osteoarthritis of knee: Secondary | ICD-10-CM | POA: Diagnosis not present

## 2016-04-02 DIAGNOSIS — R451 Restlessness and agitation: Secondary | ICD-10-CM | POA: Diagnosis not present

## 2016-04-02 DIAGNOSIS — Z79891 Long term (current) use of opiate analgesic: Secondary | ICD-10-CM | POA: Diagnosis not present

## 2016-04-02 DIAGNOSIS — Z7984 Long term (current) use of oral hypoglycemic drugs: Secondary | ICD-10-CM | POA: Diagnosis not present

## 2016-04-02 DIAGNOSIS — L409 Psoriasis, unspecified: Secondary | ICD-10-CM | POA: Diagnosis not present

## 2016-04-02 DIAGNOSIS — E11618 Type 2 diabetes mellitus with other diabetic arthropathy: Secondary | ICD-10-CM | POA: Diagnosis not present

## 2016-04-02 DIAGNOSIS — I10 Essential (primary) hypertension: Secondary | ICD-10-CM | POA: Diagnosis not present

## 2016-04-03 DIAGNOSIS — Z7984 Long term (current) use of oral hypoglycemic drugs: Secondary | ICD-10-CM | POA: Diagnosis not present

## 2016-04-03 DIAGNOSIS — Z87891 Personal history of nicotine dependence: Secondary | ICD-10-CM | POA: Diagnosis not present

## 2016-04-03 DIAGNOSIS — E785 Hyperlipidemia, unspecified: Secondary | ICD-10-CM | POA: Diagnosis not present

## 2016-04-03 DIAGNOSIS — E11618 Type 2 diabetes mellitus with other diabetic arthropathy: Secondary | ICD-10-CM | POA: Diagnosis not present

## 2016-04-03 DIAGNOSIS — Z79891 Long term (current) use of opiate analgesic: Secondary | ICD-10-CM | POA: Diagnosis not present

## 2016-04-03 DIAGNOSIS — I10 Essential (primary) hypertension: Secondary | ICD-10-CM | POA: Diagnosis not present

## 2016-04-03 DIAGNOSIS — M170 Bilateral primary osteoarthritis of knee: Secondary | ICD-10-CM | POA: Diagnosis not present

## 2016-04-03 DIAGNOSIS — L409 Psoriasis, unspecified: Secondary | ICD-10-CM | POA: Diagnosis not present

## 2016-04-03 DIAGNOSIS — J449 Chronic obstructive pulmonary disease, unspecified: Secondary | ICD-10-CM | POA: Diagnosis not present

## 2016-04-09 DIAGNOSIS — E785 Hyperlipidemia, unspecified: Secondary | ICD-10-CM | POA: Diagnosis not present

## 2016-04-09 DIAGNOSIS — J449 Chronic obstructive pulmonary disease, unspecified: Secondary | ICD-10-CM | POA: Diagnosis not present

## 2016-04-09 DIAGNOSIS — I10 Essential (primary) hypertension: Secondary | ICD-10-CM | POA: Diagnosis not present

## 2016-04-09 DIAGNOSIS — Z7984 Long term (current) use of oral hypoglycemic drugs: Secondary | ICD-10-CM | POA: Diagnosis not present

## 2016-04-09 DIAGNOSIS — L409 Psoriasis, unspecified: Secondary | ICD-10-CM | POA: Diagnosis not present

## 2016-04-09 DIAGNOSIS — Z87891 Personal history of nicotine dependence: Secondary | ICD-10-CM | POA: Diagnosis not present

## 2016-04-09 DIAGNOSIS — Z79891 Long term (current) use of opiate analgesic: Secondary | ICD-10-CM | POA: Diagnosis not present

## 2016-04-09 DIAGNOSIS — E11618 Type 2 diabetes mellitus with other diabetic arthropathy: Secondary | ICD-10-CM | POA: Diagnosis not present

## 2016-04-09 DIAGNOSIS — M170 Bilateral primary osteoarthritis of knee: Secondary | ICD-10-CM | POA: Diagnosis not present

## 2016-04-10 DIAGNOSIS — M170 Bilateral primary osteoarthritis of knee: Secondary | ICD-10-CM | POA: Diagnosis not present

## 2016-04-10 DIAGNOSIS — Z79891 Long term (current) use of opiate analgesic: Secondary | ICD-10-CM | POA: Diagnosis not present

## 2016-04-10 DIAGNOSIS — Z87891 Personal history of nicotine dependence: Secondary | ICD-10-CM | POA: Diagnosis not present

## 2016-04-10 DIAGNOSIS — L409 Psoriasis, unspecified: Secondary | ICD-10-CM | POA: Diagnosis not present

## 2016-04-10 DIAGNOSIS — E785 Hyperlipidemia, unspecified: Secondary | ICD-10-CM | POA: Diagnosis not present

## 2016-04-10 DIAGNOSIS — J449 Chronic obstructive pulmonary disease, unspecified: Secondary | ICD-10-CM | POA: Diagnosis not present

## 2016-04-10 DIAGNOSIS — I10 Essential (primary) hypertension: Secondary | ICD-10-CM | POA: Diagnosis not present

## 2016-04-10 DIAGNOSIS — E11618 Type 2 diabetes mellitus with other diabetic arthropathy: Secondary | ICD-10-CM | POA: Diagnosis not present

## 2016-04-10 DIAGNOSIS — Z7984 Long term (current) use of oral hypoglycemic drugs: Secondary | ICD-10-CM | POA: Diagnosis not present

## 2016-04-14 DIAGNOSIS — E785 Hyperlipidemia, unspecified: Secondary | ICD-10-CM | POA: Diagnosis not present

## 2016-04-14 DIAGNOSIS — Z87891 Personal history of nicotine dependence: Secondary | ICD-10-CM | POA: Diagnosis not present

## 2016-04-14 DIAGNOSIS — I10 Essential (primary) hypertension: Secondary | ICD-10-CM | POA: Diagnosis not present

## 2016-04-14 DIAGNOSIS — Z79891 Long term (current) use of opiate analgesic: Secondary | ICD-10-CM | POA: Diagnosis not present

## 2016-04-14 DIAGNOSIS — M170 Bilateral primary osteoarthritis of knee: Secondary | ICD-10-CM | POA: Diagnosis not present

## 2016-04-14 DIAGNOSIS — J449 Chronic obstructive pulmonary disease, unspecified: Secondary | ICD-10-CM | POA: Diagnosis not present

## 2016-04-14 DIAGNOSIS — E11618 Type 2 diabetes mellitus with other diabetic arthropathy: Secondary | ICD-10-CM | POA: Diagnosis not present

## 2016-04-14 DIAGNOSIS — L409 Psoriasis, unspecified: Secondary | ICD-10-CM | POA: Diagnosis not present

## 2016-04-14 DIAGNOSIS — Z7984 Long term (current) use of oral hypoglycemic drugs: Secondary | ICD-10-CM | POA: Diagnosis not present

## 2016-04-18 DIAGNOSIS — Z87891 Personal history of nicotine dependence: Secondary | ICD-10-CM | POA: Diagnosis not present

## 2016-04-18 DIAGNOSIS — J449 Chronic obstructive pulmonary disease, unspecified: Secondary | ICD-10-CM | POA: Diagnosis not present

## 2016-04-18 DIAGNOSIS — Z7984 Long term (current) use of oral hypoglycemic drugs: Secondary | ICD-10-CM | POA: Diagnosis not present

## 2016-04-18 DIAGNOSIS — E11618 Type 2 diabetes mellitus with other diabetic arthropathy: Secondary | ICD-10-CM | POA: Diagnosis not present

## 2016-04-18 DIAGNOSIS — M170 Bilateral primary osteoarthritis of knee: Secondary | ICD-10-CM | POA: Diagnosis not present

## 2016-04-18 DIAGNOSIS — E785 Hyperlipidemia, unspecified: Secondary | ICD-10-CM | POA: Diagnosis not present

## 2016-04-18 DIAGNOSIS — Z79891 Long term (current) use of opiate analgesic: Secondary | ICD-10-CM | POA: Diagnosis not present

## 2016-04-18 DIAGNOSIS — L409 Psoriasis, unspecified: Secondary | ICD-10-CM | POA: Diagnosis not present

## 2016-04-18 DIAGNOSIS — I10 Essential (primary) hypertension: Secondary | ICD-10-CM | POA: Diagnosis not present

## 2016-04-21 DIAGNOSIS — E785 Hyperlipidemia, unspecified: Secondary | ICD-10-CM | POA: Diagnosis not present

## 2016-04-21 DIAGNOSIS — L409 Psoriasis, unspecified: Secondary | ICD-10-CM | POA: Diagnosis not present

## 2016-04-21 DIAGNOSIS — E11618 Type 2 diabetes mellitus with other diabetic arthropathy: Secondary | ICD-10-CM | POA: Diagnosis not present

## 2016-04-21 DIAGNOSIS — Z79891 Long term (current) use of opiate analgesic: Secondary | ICD-10-CM | POA: Diagnosis not present

## 2016-04-21 DIAGNOSIS — J449 Chronic obstructive pulmonary disease, unspecified: Secondary | ICD-10-CM | POA: Diagnosis not present

## 2016-04-21 DIAGNOSIS — M170 Bilateral primary osteoarthritis of knee: Secondary | ICD-10-CM | POA: Diagnosis not present

## 2016-04-21 DIAGNOSIS — Z7984 Long term (current) use of oral hypoglycemic drugs: Secondary | ICD-10-CM | POA: Diagnosis not present

## 2016-04-21 DIAGNOSIS — I10 Essential (primary) hypertension: Secondary | ICD-10-CM | POA: Diagnosis not present

## 2016-04-21 DIAGNOSIS — Z87891 Personal history of nicotine dependence: Secondary | ICD-10-CM | POA: Diagnosis not present

## 2016-04-23 DIAGNOSIS — E785 Hyperlipidemia, unspecified: Secondary | ICD-10-CM | POA: Diagnosis not present

## 2016-04-23 DIAGNOSIS — I10 Essential (primary) hypertension: Secondary | ICD-10-CM | POA: Diagnosis not present

## 2016-04-23 DIAGNOSIS — J449 Chronic obstructive pulmonary disease, unspecified: Secondary | ICD-10-CM | POA: Diagnosis not present

## 2016-04-23 DIAGNOSIS — L409 Psoriasis, unspecified: Secondary | ICD-10-CM | POA: Diagnosis not present

## 2016-04-23 DIAGNOSIS — M170 Bilateral primary osteoarthritis of knee: Secondary | ICD-10-CM | POA: Diagnosis not present

## 2016-04-23 DIAGNOSIS — E11618 Type 2 diabetes mellitus with other diabetic arthropathy: Secondary | ICD-10-CM | POA: Diagnosis not present

## 2016-04-23 DIAGNOSIS — Z87891 Personal history of nicotine dependence: Secondary | ICD-10-CM | POA: Diagnosis not present

## 2016-04-23 DIAGNOSIS — Z79891 Long term (current) use of opiate analgesic: Secondary | ICD-10-CM | POA: Diagnosis not present

## 2016-04-23 DIAGNOSIS — Z7984 Long term (current) use of oral hypoglycemic drugs: Secondary | ICD-10-CM | POA: Diagnosis not present

## 2016-04-30 DIAGNOSIS — E785 Hyperlipidemia, unspecified: Secondary | ICD-10-CM | POA: Diagnosis not present

## 2016-04-30 DIAGNOSIS — I10 Essential (primary) hypertension: Secondary | ICD-10-CM | POA: Diagnosis not present

## 2016-04-30 DIAGNOSIS — Z79891 Long term (current) use of opiate analgesic: Secondary | ICD-10-CM | POA: Diagnosis not present

## 2016-04-30 DIAGNOSIS — M170 Bilateral primary osteoarthritis of knee: Secondary | ICD-10-CM | POA: Diagnosis not present

## 2016-04-30 DIAGNOSIS — L409 Psoriasis, unspecified: Secondary | ICD-10-CM | POA: Diagnosis not present

## 2016-04-30 DIAGNOSIS — E11618 Type 2 diabetes mellitus with other diabetic arthropathy: Secondary | ICD-10-CM | POA: Diagnosis not present

## 2016-04-30 DIAGNOSIS — Z87891 Personal history of nicotine dependence: Secondary | ICD-10-CM | POA: Diagnosis not present

## 2016-04-30 DIAGNOSIS — Z7984 Long term (current) use of oral hypoglycemic drugs: Secondary | ICD-10-CM | POA: Diagnosis not present

## 2016-04-30 DIAGNOSIS — F0150 Vascular dementia without behavioral disturbance: Secondary | ICD-10-CM | POA: Diagnosis not present

## 2016-04-30 DIAGNOSIS — J449 Chronic obstructive pulmonary disease, unspecified: Secondary | ICD-10-CM | POA: Diagnosis not present

## 2016-05-02 DIAGNOSIS — E785 Hyperlipidemia, unspecified: Secondary | ICD-10-CM | POA: Diagnosis not present

## 2016-05-02 DIAGNOSIS — Z7984 Long term (current) use of oral hypoglycemic drugs: Secondary | ICD-10-CM | POA: Diagnosis not present

## 2016-05-02 DIAGNOSIS — Z87891 Personal history of nicotine dependence: Secondary | ICD-10-CM | POA: Diagnosis not present

## 2016-05-02 DIAGNOSIS — I10 Essential (primary) hypertension: Secondary | ICD-10-CM | POA: Diagnosis not present

## 2016-05-02 DIAGNOSIS — L409 Psoriasis, unspecified: Secondary | ICD-10-CM | POA: Diagnosis not present

## 2016-05-02 DIAGNOSIS — Z79891 Long term (current) use of opiate analgesic: Secondary | ICD-10-CM | POA: Diagnosis not present

## 2016-05-02 DIAGNOSIS — J449 Chronic obstructive pulmonary disease, unspecified: Secondary | ICD-10-CM | POA: Diagnosis not present

## 2016-05-02 DIAGNOSIS — E11618 Type 2 diabetes mellitus with other diabetic arthropathy: Secondary | ICD-10-CM | POA: Diagnosis not present

## 2016-05-02 DIAGNOSIS — M170 Bilateral primary osteoarthritis of knee: Secondary | ICD-10-CM | POA: Diagnosis not present

## 2016-05-05 DIAGNOSIS — Z79891 Long term (current) use of opiate analgesic: Secondary | ICD-10-CM | POA: Diagnosis not present

## 2016-05-05 DIAGNOSIS — I10 Essential (primary) hypertension: Secondary | ICD-10-CM | POA: Diagnosis not present

## 2016-05-05 DIAGNOSIS — Z87891 Personal history of nicotine dependence: Secondary | ICD-10-CM | POA: Diagnosis not present

## 2016-05-05 DIAGNOSIS — E11618 Type 2 diabetes mellitus with other diabetic arthropathy: Secondary | ICD-10-CM | POA: Diagnosis not present

## 2016-05-05 DIAGNOSIS — Z7984 Long term (current) use of oral hypoglycemic drugs: Secondary | ICD-10-CM | POA: Diagnosis not present

## 2016-05-05 DIAGNOSIS — E785 Hyperlipidemia, unspecified: Secondary | ICD-10-CM | POA: Diagnosis not present

## 2016-05-05 DIAGNOSIS — M170 Bilateral primary osteoarthritis of knee: Secondary | ICD-10-CM | POA: Diagnosis not present

## 2016-05-05 DIAGNOSIS — J449 Chronic obstructive pulmonary disease, unspecified: Secondary | ICD-10-CM | POA: Diagnosis not present

## 2016-05-05 DIAGNOSIS — L409 Psoriasis, unspecified: Secondary | ICD-10-CM | POA: Diagnosis not present

## 2016-05-08 DIAGNOSIS — E785 Hyperlipidemia, unspecified: Secondary | ICD-10-CM | POA: Diagnosis not present

## 2016-05-08 DIAGNOSIS — Z79891 Long term (current) use of opiate analgesic: Secondary | ICD-10-CM | POA: Diagnosis not present

## 2016-05-08 DIAGNOSIS — L409 Psoriasis, unspecified: Secondary | ICD-10-CM | POA: Diagnosis not present

## 2016-05-08 DIAGNOSIS — I10 Essential (primary) hypertension: Secondary | ICD-10-CM | POA: Diagnosis not present

## 2016-05-08 DIAGNOSIS — Z87891 Personal history of nicotine dependence: Secondary | ICD-10-CM | POA: Diagnosis not present

## 2016-05-08 DIAGNOSIS — J449 Chronic obstructive pulmonary disease, unspecified: Secondary | ICD-10-CM | POA: Diagnosis not present

## 2016-05-08 DIAGNOSIS — E11618 Type 2 diabetes mellitus with other diabetic arthropathy: Secondary | ICD-10-CM | POA: Diagnosis not present

## 2016-05-08 DIAGNOSIS — Z7984 Long term (current) use of oral hypoglycemic drugs: Secondary | ICD-10-CM | POA: Diagnosis not present

## 2016-05-08 DIAGNOSIS — M170 Bilateral primary osteoarthritis of knee: Secondary | ICD-10-CM | POA: Diagnosis not present

## 2016-05-14 DIAGNOSIS — E785 Hyperlipidemia, unspecified: Secondary | ICD-10-CM | POA: Diagnosis not present

## 2016-05-14 DIAGNOSIS — E11618 Type 2 diabetes mellitus with other diabetic arthropathy: Secondary | ICD-10-CM | POA: Diagnosis not present

## 2016-05-14 DIAGNOSIS — J449 Chronic obstructive pulmonary disease, unspecified: Secondary | ICD-10-CM | POA: Diagnosis not present

## 2016-05-14 DIAGNOSIS — L409 Psoriasis, unspecified: Secondary | ICD-10-CM | POA: Diagnosis not present

## 2016-05-14 DIAGNOSIS — Z7984 Long term (current) use of oral hypoglycemic drugs: Secondary | ICD-10-CM | POA: Diagnosis not present

## 2016-05-14 DIAGNOSIS — M170 Bilateral primary osteoarthritis of knee: Secondary | ICD-10-CM | POA: Diagnosis not present

## 2016-05-14 DIAGNOSIS — Z87891 Personal history of nicotine dependence: Secondary | ICD-10-CM | POA: Diagnosis not present

## 2016-05-14 DIAGNOSIS — I10 Essential (primary) hypertension: Secondary | ICD-10-CM | POA: Diagnosis not present

## 2016-05-14 DIAGNOSIS — Z79891 Long term (current) use of opiate analgesic: Secondary | ICD-10-CM | POA: Diagnosis not present

## 2016-05-16 DIAGNOSIS — E785 Hyperlipidemia, unspecified: Secondary | ICD-10-CM | POA: Diagnosis not present

## 2016-05-16 DIAGNOSIS — Z7984 Long term (current) use of oral hypoglycemic drugs: Secondary | ICD-10-CM | POA: Diagnosis not present

## 2016-05-16 DIAGNOSIS — Z79891 Long term (current) use of opiate analgesic: Secondary | ICD-10-CM | POA: Diagnosis not present

## 2016-05-16 DIAGNOSIS — J449 Chronic obstructive pulmonary disease, unspecified: Secondary | ICD-10-CM | POA: Diagnosis not present

## 2016-05-16 DIAGNOSIS — M170 Bilateral primary osteoarthritis of knee: Secondary | ICD-10-CM | POA: Diagnosis not present

## 2016-05-16 DIAGNOSIS — L409 Psoriasis, unspecified: Secondary | ICD-10-CM | POA: Diagnosis not present

## 2016-05-16 DIAGNOSIS — E11618 Type 2 diabetes mellitus with other diabetic arthropathy: Secondary | ICD-10-CM | POA: Diagnosis not present

## 2016-05-16 DIAGNOSIS — Z87891 Personal history of nicotine dependence: Secondary | ICD-10-CM | POA: Diagnosis not present

## 2016-05-16 DIAGNOSIS — I10 Essential (primary) hypertension: Secondary | ICD-10-CM | POA: Diagnosis not present

## 2016-05-22 DIAGNOSIS — E11618 Type 2 diabetes mellitus with other diabetic arthropathy: Secondary | ICD-10-CM | POA: Diagnosis not present

## 2016-05-22 DIAGNOSIS — Z79891 Long term (current) use of opiate analgesic: Secondary | ICD-10-CM | POA: Diagnosis not present

## 2016-05-22 DIAGNOSIS — E785 Hyperlipidemia, unspecified: Secondary | ICD-10-CM | POA: Diagnosis not present

## 2016-05-22 DIAGNOSIS — J449 Chronic obstructive pulmonary disease, unspecified: Secondary | ICD-10-CM | POA: Diagnosis not present

## 2016-05-22 DIAGNOSIS — Z7984 Long term (current) use of oral hypoglycemic drugs: Secondary | ICD-10-CM | POA: Diagnosis not present

## 2016-05-22 DIAGNOSIS — M170 Bilateral primary osteoarthritis of knee: Secondary | ICD-10-CM | POA: Diagnosis not present

## 2016-05-22 DIAGNOSIS — Z87891 Personal history of nicotine dependence: Secondary | ICD-10-CM | POA: Diagnosis not present

## 2016-05-22 DIAGNOSIS — I10 Essential (primary) hypertension: Secondary | ICD-10-CM | POA: Diagnosis not present

## 2016-05-22 DIAGNOSIS — L409 Psoriasis, unspecified: Secondary | ICD-10-CM | POA: Diagnosis not present

## 2016-05-23 DIAGNOSIS — E11618 Type 2 diabetes mellitus with other diabetic arthropathy: Secondary | ICD-10-CM | POA: Diagnosis not present

## 2016-05-23 DIAGNOSIS — L409 Psoriasis, unspecified: Secondary | ICD-10-CM | POA: Diagnosis not present

## 2016-05-23 DIAGNOSIS — Z79891 Long term (current) use of opiate analgesic: Secondary | ICD-10-CM | POA: Diagnosis not present

## 2016-05-23 DIAGNOSIS — M170 Bilateral primary osteoarthritis of knee: Secondary | ICD-10-CM | POA: Diagnosis not present

## 2016-05-23 DIAGNOSIS — Z7984 Long term (current) use of oral hypoglycemic drugs: Secondary | ICD-10-CM | POA: Diagnosis not present

## 2016-05-23 DIAGNOSIS — J449 Chronic obstructive pulmonary disease, unspecified: Secondary | ICD-10-CM | POA: Diagnosis not present

## 2016-05-23 DIAGNOSIS — E785 Hyperlipidemia, unspecified: Secondary | ICD-10-CM | POA: Diagnosis not present

## 2016-05-23 DIAGNOSIS — Z87891 Personal history of nicotine dependence: Secondary | ICD-10-CM | POA: Diagnosis not present

## 2016-05-23 DIAGNOSIS — I10 Essential (primary) hypertension: Secondary | ICD-10-CM | POA: Diagnosis not present

## 2016-05-27 DIAGNOSIS — L409 Psoriasis, unspecified: Secondary | ICD-10-CM | POA: Diagnosis not present

## 2016-05-27 DIAGNOSIS — Z7984 Long term (current) use of oral hypoglycemic drugs: Secondary | ICD-10-CM | POA: Diagnosis not present

## 2016-05-27 DIAGNOSIS — M170 Bilateral primary osteoarthritis of knee: Secondary | ICD-10-CM | POA: Diagnosis not present

## 2016-05-27 DIAGNOSIS — J449 Chronic obstructive pulmonary disease, unspecified: Secondary | ICD-10-CM | POA: Diagnosis not present

## 2016-05-27 DIAGNOSIS — I10 Essential (primary) hypertension: Secondary | ICD-10-CM | POA: Diagnosis not present

## 2016-05-27 DIAGNOSIS — E785 Hyperlipidemia, unspecified: Secondary | ICD-10-CM | POA: Diagnosis not present

## 2016-05-27 DIAGNOSIS — Z5181 Encounter for therapeutic drug level monitoring: Secondary | ICD-10-CM | POA: Diagnosis not present

## 2016-05-27 DIAGNOSIS — E11618 Type 2 diabetes mellitus with other diabetic arthropathy: Secondary | ICD-10-CM | POA: Diagnosis not present

## 2016-05-30 DIAGNOSIS — I10 Essential (primary) hypertension: Secondary | ICD-10-CM | POA: Diagnosis not present

## 2016-05-30 DIAGNOSIS — Z5181 Encounter for therapeutic drug level monitoring: Secondary | ICD-10-CM | POA: Diagnosis not present

## 2016-05-30 DIAGNOSIS — J449 Chronic obstructive pulmonary disease, unspecified: Secondary | ICD-10-CM | POA: Diagnosis not present

## 2016-05-30 DIAGNOSIS — L409 Psoriasis, unspecified: Secondary | ICD-10-CM | POA: Diagnosis not present

## 2016-05-30 DIAGNOSIS — E785 Hyperlipidemia, unspecified: Secondary | ICD-10-CM | POA: Diagnosis not present

## 2016-05-30 DIAGNOSIS — M170 Bilateral primary osteoarthritis of knee: Secondary | ICD-10-CM | POA: Diagnosis not present

## 2016-05-30 DIAGNOSIS — E11618 Type 2 diabetes mellitus with other diabetic arthropathy: Secondary | ICD-10-CM | POA: Diagnosis not present

## 2016-05-30 DIAGNOSIS — Z7984 Long term (current) use of oral hypoglycemic drugs: Secondary | ICD-10-CM | POA: Diagnosis not present

## 2016-06-02 DIAGNOSIS — Z5181 Encounter for therapeutic drug level monitoring: Secondary | ICD-10-CM | POA: Diagnosis not present

## 2016-06-02 DIAGNOSIS — J449 Chronic obstructive pulmonary disease, unspecified: Secondary | ICD-10-CM | POA: Diagnosis not present

## 2016-06-02 DIAGNOSIS — E785 Hyperlipidemia, unspecified: Secondary | ICD-10-CM | POA: Diagnosis not present

## 2016-06-02 DIAGNOSIS — Z7984 Long term (current) use of oral hypoglycemic drugs: Secondary | ICD-10-CM | POA: Diagnosis not present

## 2016-06-02 DIAGNOSIS — I10 Essential (primary) hypertension: Secondary | ICD-10-CM | POA: Diagnosis not present

## 2016-06-02 DIAGNOSIS — M170 Bilateral primary osteoarthritis of knee: Secondary | ICD-10-CM | POA: Diagnosis not present

## 2016-06-02 DIAGNOSIS — L409 Psoriasis, unspecified: Secondary | ICD-10-CM | POA: Diagnosis not present

## 2016-06-02 DIAGNOSIS — E11618 Type 2 diabetes mellitus with other diabetic arthropathy: Secondary | ICD-10-CM | POA: Diagnosis not present

## 2016-06-04 DIAGNOSIS — Z7984 Long term (current) use of oral hypoglycemic drugs: Secondary | ICD-10-CM | POA: Diagnosis not present

## 2016-06-04 DIAGNOSIS — J449 Chronic obstructive pulmonary disease, unspecified: Secondary | ICD-10-CM | POA: Diagnosis not present

## 2016-06-04 DIAGNOSIS — L409 Psoriasis, unspecified: Secondary | ICD-10-CM | POA: Diagnosis not present

## 2016-06-04 DIAGNOSIS — E785 Hyperlipidemia, unspecified: Secondary | ICD-10-CM | POA: Diagnosis not present

## 2016-06-04 DIAGNOSIS — M170 Bilateral primary osteoarthritis of knee: Secondary | ICD-10-CM | POA: Diagnosis not present

## 2016-06-04 DIAGNOSIS — E11618 Type 2 diabetes mellitus with other diabetic arthropathy: Secondary | ICD-10-CM | POA: Diagnosis not present

## 2016-06-04 DIAGNOSIS — N39498 Other specified urinary incontinence: Secondary | ICD-10-CM | POA: Diagnosis not present

## 2016-06-04 DIAGNOSIS — I10 Essential (primary) hypertension: Secondary | ICD-10-CM | POA: Diagnosis not present

## 2016-06-04 DIAGNOSIS — Z5181 Encounter for therapeutic drug level monitoring: Secondary | ICD-10-CM | POA: Diagnosis not present

## 2016-06-04 DIAGNOSIS — F0150 Vascular dementia without behavioral disturbance: Secondary | ICD-10-CM | POA: Diagnosis not present

## 2016-06-05 DIAGNOSIS — E11618 Type 2 diabetes mellitus with other diabetic arthropathy: Secondary | ICD-10-CM | POA: Diagnosis not present

## 2016-06-05 DIAGNOSIS — L409 Psoriasis, unspecified: Secondary | ICD-10-CM | POA: Diagnosis not present

## 2016-06-05 DIAGNOSIS — Z5181 Encounter for therapeutic drug level monitoring: Secondary | ICD-10-CM | POA: Diagnosis not present

## 2016-06-05 DIAGNOSIS — J449 Chronic obstructive pulmonary disease, unspecified: Secondary | ICD-10-CM | POA: Diagnosis not present

## 2016-06-05 DIAGNOSIS — Z7984 Long term (current) use of oral hypoglycemic drugs: Secondary | ICD-10-CM | POA: Diagnosis not present

## 2016-06-05 DIAGNOSIS — E785 Hyperlipidemia, unspecified: Secondary | ICD-10-CM | POA: Diagnosis not present

## 2016-06-05 DIAGNOSIS — I10 Essential (primary) hypertension: Secondary | ICD-10-CM | POA: Diagnosis not present

## 2016-06-05 DIAGNOSIS — M170 Bilateral primary osteoarthritis of knee: Secondary | ICD-10-CM | POA: Diagnosis not present

## 2016-06-06 DIAGNOSIS — G301 Alzheimer's disease with late onset: Secondary | ICD-10-CM | POA: Diagnosis not present

## 2016-06-06 DIAGNOSIS — Z780 Asymptomatic menopausal state: Secondary | ICD-10-CM | POA: Diagnosis not present

## 2016-06-06 DIAGNOSIS — I10 Essential (primary) hypertension: Secondary | ICD-10-CM | POA: Diagnosis not present

## 2016-06-06 DIAGNOSIS — R112 Nausea with vomiting, unspecified: Secondary | ICD-10-CM | POA: Diagnosis not present

## 2016-06-06 DIAGNOSIS — R1084 Generalized abdominal pain: Secondary | ICD-10-CM | POA: Diagnosis not present

## 2016-06-06 DIAGNOSIS — J449 Chronic obstructive pulmonary disease, unspecified: Secondary | ICD-10-CM | POA: Diagnosis not present

## 2016-06-06 DIAGNOSIS — E860 Dehydration: Secondary | ICD-10-CM | POA: Diagnosis not present

## 2016-06-06 DIAGNOSIS — Z79899 Other long term (current) drug therapy: Secondary | ICD-10-CM | POA: Diagnosis not present

## 2016-06-06 DIAGNOSIS — Z9049 Acquired absence of other specified parts of digestive tract: Secondary | ICD-10-CM | POA: Diagnosis not present

## 2016-06-06 DIAGNOSIS — E872 Acidosis: Secondary | ICD-10-CM | POA: Diagnosis not present

## 2016-06-06 DIAGNOSIS — K5669 Other intestinal obstruction: Secondary | ICD-10-CM | POA: Diagnosis not present

## 2016-06-06 DIAGNOSIS — K5660 Unspecified intestinal obstruction: Secondary | ICD-10-CM | POA: Diagnosis not present

## 2016-06-06 DIAGNOSIS — E119 Type 2 diabetes mellitus without complications: Secondary | ICD-10-CM | POA: Diagnosis not present

## 2016-06-06 DIAGNOSIS — Z87891 Personal history of nicotine dependence: Secondary | ICD-10-CM | POA: Diagnosis not present

## 2016-06-06 DIAGNOSIS — R109 Unspecified abdominal pain: Secondary | ICD-10-CM | POA: Diagnosis not present

## 2016-06-06 DIAGNOSIS — K565 Intestinal adhesions [bands] with obstruction (postprocedural) (postinfection): Secondary | ICD-10-CM | POA: Diagnosis not present

## 2016-06-06 DIAGNOSIS — K219 Gastro-esophageal reflux disease without esophagitis: Secondary | ICD-10-CM | POA: Diagnosis not present

## 2016-06-13 ENCOUNTER — Emergency Department (HOSPITAL_COMMUNITY)
Admission: EM | Admit: 2016-06-13 | Discharge: 2016-06-14 | Disposition: A | Discharge status: Home / Self Care | Payer: Medicare Other | Attending: Emergency Medicine | Admitting: Emergency Medicine

## 2016-06-13 ENCOUNTER — Emergency Department (HOSPITAL_COMMUNITY): Payer: Medicare Other

## 2016-06-13 ENCOUNTER — Encounter (HOSPITAL_COMMUNITY): Payer: Self-pay | Admitting: Emergency Medicine

## 2016-06-13 DIAGNOSIS — Z79891 Long term (current) use of opiate analgesic: Secondary | ICD-10-CM | POA: Insufficient documentation

## 2016-06-13 DIAGNOSIS — R109 Unspecified abdominal pain: Secondary | ICD-10-CM | POA: Diagnosis not present

## 2016-06-13 DIAGNOSIS — R140 Abdominal distension (gaseous): Secondary | ICD-10-CM | POA: Diagnosis not present

## 2016-06-13 DIAGNOSIS — Z7951 Long term (current) use of inhaled steroids: Secondary | ICD-10-CM | POA: Insufficient documentation

## 2016-06-13 DIAGNOSIS — E119 Type 2 diabetes mellitus without complications: Secondary | ICD-10-CM | POA: Insufficient documentation

## 2016-06-13 DIAGNOSIS — F0390 Unspecified dementia without behavioral disturbance: Secondary | ICD-10-CM | POA: Insufficient documentation

## 2016-06-13 DIAGNOSIS — E785 Hyperlipidemia, unspecified: Secondary | ICD-10-CM | POA: Diagnosis not present

## 2016-06-13 DIAGNOSIS — E11618 Type 2 diabetes mellitus with other diabetic arthropathy: Secondary | ICD-10-CM | POA: Diagnosis not present

## 2016-06-13 DIAGNOSIS — L409 Psoriasis, unspecified: Secondary | ICD-10-CM | POA: Diagnosis not present

## 2016-06-13 DIAGNOSIS — I10 Essential (primary) hypertension: Secondary | ICD-10-CM | POA: Diagnosis not present

## 2016-06-13 DIAGNOSIS — Z5181 Encounter for therapeutic drug level monitoring: Secondary | ICD-10-CM | POA: Diagnosis not present

## 2016-06-13 DIAGNOSIS — Z7984 Long term (current) use of oral hypoglycemic drugs: Secondary | ICD-10-CM | POA: Insufficient documentation

## 2016-06-13 DIAGNOSIS — M170 Bilateral primary osteoarthritis of knee: Secondary | ICD-10-CM | POA: Diagnosis not present

## 2016-06-13 DIAGNOSIS — Z79899 Other long term (current) drug therapy: Secondary | ICD-10-CM | POA: Insufficient documentation

## 2016-06-13 DIAGNOSIS — Z87891 Personal history of nicotine dependence: Secondary | ICD-10-CM | POA: Insufficient documentation

## 2016-06-13 DIAGNOSIS — J449 Chronic obstructive pulmonary disease, unspecified: Secondary | ICD-10-CM | POA: Diagnosis not present

## 2016-06-13 DIAGNOSIS — K5790 Diverticulosis of intestine, part unspecified, without perforation or abscess without bleeding: Secondary | ICD-10-CM | POA: Diagnosis not present

## 2016-06-13 LAB — URINALYSIS, ROUTINE W REFLEX MICROSCOPIC
BILIRUBIN URINE: NEGATIVE
Glucose, UA: NEGATIVE mg/dL
Hgb urine dipstick: NEGATIVE
KETONES UR: NEGATIVE mg/dL
NITRITE: NEGATIVE
PH: 5.5 (ref 5.0–8.0)
Protein, ur: NEGATIVE mg/dL
Specific Gravity, Urine: 1.017 (ref 1.005–1.030)

## 2016-06-13 LAB — COMPREHENSIVE METABOLIC PANEL
ALK PHOS: 75 U/L (ref 38–126)
ALT: 14 U/L (ref 14–54)
ANION GAP: 8 (ref 5–15)
AST: 16 U/L (ref 15–41)
Albumin: 3.9 g/dL (ref 3.5–5.0)
BILIRUBIN TOTAL: 0.5 mg/dL (ref 0.3–1.2)
BUN: 8 mg/dL (ref 6–20)
CALCIUM: 9.8 mg/dL (ref 8.9–10.3)
CO2: 23 mmol/L (ref 22–32)
Chloride: 102 mmol/L (ref 101–111)
Creatinine, Ser: 0.71 mg/dL (ref 0.44–1.00)
GFR calc Af Amer: >60 mL/min (ref >60)
GLUCOSE: 114 mg/dL — AB (ref 65–99)
POTASSIUM: 4.4 mmol/L (ref 3.5–5.1)
Sodium: 133 mmol/L — ABNORMAL LOW (ref 135–145)
TOTAL PROTEIN: 7.5 g/dL (ref 6.5–8.1)

## 2016-06-13 LAB — CBC
HEMATOCRIT: 35.9 % — AB (ref 36.0–46.0)
HEMOGLOBIN: 11.8 g/dL — AB (ref 12.0–15.0)
MCH: 29.1 pg (ref 26.0–34.0)
MCHC: 32.9 g/dL (ref 30.0–36.0)
MCV: 88.6 fL (ref 78.0–100.0)
Platelets: 257 10*3/uL (ref 150–400)
RBC: 4.05 MIL/uL (ref 3.87–5.11)
RDW: 14.9 % (ref 11.5–15.5)
WBC: 11.9 10*3/uL — AB (ref 4.0–10.5)

## 2016-06-13 LAB — URINE MICROSCOPIC-ADD ON: RBC / HPF: NONE SEEN RBC/hpf (ref 0–5)

## 2016-06-13 LAB — LIPASE, BLOOD: Lipase: 28 U/L (ref 11–51)

## 2016-06-13 MED ORDER — IOPAMIDOL (ISOVUE-300) INJECTION 61%
100.0000 mL | Freq: Once | INTRAVENOUS | Status: AC | PRN
  Administered 2016-06-13: 100 mL via INTRAVENOUS

## 2016-06-13 NOTE — ED Notes (Signed)
Pt stated "I can't hardly pee" after this writer requested a urine sample.

## 2016-06-13 NOTE — ED Triage Notes (Signed)
Pt was seen at [facility] on Friday and was admitted and discharged yesterday. Pt states she was seen for a SBO. Pt states she had a NG tube placed there. Pt states that she has felt boated since last Friday, but the bloating got worse today. Pt states her abdomen is more distended than normal. LBM was this morning

## 2016-06-14 NOTE — ED Provider Notes (Signed)
[facility]-EMERGENCY DEPT Provider Note   CSN: [id number] Arrival date & time: 06/13/16  1807  First Provider Contact:  None       History   Chief Complaint Chief Complaint  Patient presents with  . Abdominal Pain    HPI Amanda Adkins is a 75 y.o. female.  75 year old female with past medical history including dementia, type 2 diabetes mellitus, diverticulosis, SBO who presents with abdominal bloating. The patient was admitted to an outside hospital for SBO and discharged yesterday. Urine her hospitalization, she had an NG tube placed and obstruction resolved medically. She reports that she has felt bloated for the past one week but the bleeding got worse today. She reports passing gas and having a normal bowel movement within the last 24 hours after discharge. No nausea or vomiting. She states that she is very hungry but is afraid to eat a large meal. No fevers or urinary symptoms. No abdominal pain, however granddaughter reports that the patient was complaining of abdominal pain earlier today.  LEVEL 5 CAVEAT DUE TO DEMENTIA   The history is provided by the patient and a relative.  Abdominal Pain      Past Medical History:  Diagnosis Date  . Abdominal pain, generalized   . Abdominal pain, generalized   . Abdominal pain, unspecified site   . Abnormality of gait   . Abnormality of gait   . Abnormality of gait   . Acute bronchitis   . Acute sinusitis, unspecified   . Allergic rhinitis due to pollen   . Allergic rhinitis due to pollen   . Allergy   . Anxiety   . Anxiety state, unspecified   . Candidiasis of vulva and vagina   . Cervicitis and endocervicitis   . Chronic pain of both shoulders   . Closed dislocation of shoulder, unspecified site   . Constipation, chronic   . Contact with or exposure to other communicable diseases(V01.89)   . Cough   . Dementia in conditions classified elsewhere without behavioral disturbance   . Dermatitis   . Diabetes mellitus without  complication (HCC)   . Diarrhea   . Diarrhea   . Diverticulosis of colon (without mention of hemorrhage)   . GERD (gastroesophageal reflux disease)   . Hearing loss sensory, bilateral   . Hepatomegaly   . Hepatomegaly   . History of fall   . Hypercalcemia   . Hyperlipidemia   . Hypertension   . Hypopotassemia   . Impacted cerumen   . Kyphosis (acquired) (postural)   . Leukocytosis, unspecified   . Muscle weakness (generalized)   . Osteoarthritis of both knees   . Osteoporosis   . Other and unspecified hyperlipidemia   . Other B-complex deficiencies   . Other malaise and fatigue   . Other malaise and fatigue   . Other psoriasis   . Other specified pre-operative examination   . Pain   . Pain in joint, lower leg   . Pain in joint, shoulder region   . Pain in joint, site unspecified   . Personal history of fall   . Posttraumatic stress disorder   . Posttraumatic stress disorder   . Psoriasis   . Reflux esophagitis   . Senile dementia, uncomplicated   . Senile osteoporosis   . Senile osteoporosis   . Shoulder dislocation   . Stiffness of joint, not elsewhere classified, unspecified site   . Tinnitus   . Tobacco abuse   . Tobacco use disorder   .  Tobacco use disorder   . 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 Type II or unspecified type diabetes mellitus without mention of complication, not stated as uncontrolled   . Type II or unspecified type diabetes mellitus without mention of complication, uncontrolled   . Unspecified constipation   . Unspecified essential hypertension   . Unspecified pruritic disorder   . Vascular dementia   . Vitamin B12 deficiency     Patient Active Problem List   Diagnosis Date Noted  . Candidal skin infection 02/02/2014  . 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 Pancreatic insufficiency  (HCC) 02/02/2014  . GERD (gastroesophageal reflux disease) 06/16/2013  . Weakness generalized 06/09/2013  . 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 Left patella fracture 04/07/2013  . Essential hypertension, benign   . Stiffness of joint, not elsewhere classified, unspecified site   . Pain in joint, site unspecified   . Chronic pain of both shoulders   . Tobacco abuse   . Osteoarthritis of both knees   . Shoulder dislocation   . Vascular dementia   . DYSPNEA 09/24/2009    Past Surgical History:  Procedure Laterality Date  . BLADDER SURGERY    . ORIF PATELLA Left 04/08/2013   Procedure: OPEN REDUCTION INTERNAL (ORIF) FIXATION PATELLA;  Surgeon: Eldred Manges, MD;  Location: [facility] OR;  Service: Orthopedics;  Laterality: Left;  Open Reduction Internal Fixation Left Patella Fracture  . SPINE SURGERY    . tubual ligation      OB History    No data available       Home Medications    Prior to Admission medications   Medication Sig Start Date End Date Taking? Authorizing Provider  acetaminophen (TYLENOL) 500 MG tablet Take 1,000 mg by mouth 3 (three) times daily.   Yes Historical Provider, MD  albuterol (PROVENTIL HFA;VENTOLIN HFA) 108 (90 Base) MCG/ACT inhaler Inhale 2 puffs into the lungs every 6 (six) hours as needed for wheezing or shortness of breath.   Yes Historical Provider, MD  atenolol (TENORMIN) 50 MG tablet Take 50 mg by mouth daily.   Yes Historical Provider, MD  atorvastatin (LIPITOR) 10 MG tablet Take 10 mg by mouth daily.   Yes Historical Provider, MD  Calcipotriene 0.005 % solution Apply 1 application topically 2 (two) times daily.   Yes Historical Provider, MD  diazepam (VALIUM) 2 MG tablet Take 2 mg by mouth every 8 (eight) hours as needed for anxiety.   Yes Historical Provider, MD  dicyclomine (BENTYL) 10 MG capsule Take 10 mg by mouth 4 (four) times daily -  before meals and at bedtime.   Yes Historical Provider, MD  divalproex  (DEPAKOTE) 125 MG DR tablet Take 125 mg by mouth 3 (three) times daily.   Yes Historical Provider, MD  donepezil (ARICEPT) 10 MG tablet Take 10 mg by mouth at bedtime.   Yes Historical Provider, MD  DULoxetine (CYMBALTA) 30 MG capsule Take 30 mg by mouth daily.   Yes Historical Provider, MD  esomeprazole (NEXIUM) 40 MG capsule Take 40 mg by mouth daily before breakfast.   Yes Historical Provider, MD  fluocinolone (SYNALAR) 0.025 % cream Apply 1 application topically 2 (two) times daily as needed (for rash).   Yes Historical Provider, MD  furosemide (LASIX) 20 MG tablet Take 20 mg by mouth daily.   Yes Historical Provider,  MD  gabapentin (NEURONTIN) 300 MG capsule Take 300 mg by mouth daily.   Yes Historical Provider, MD  ipratropium (ATROVENT HFA) 17 MCG/ACT inhaler Inhale 2 puffs into the lungs every 6 (six) hours as needed for wheezing. 02/01/16  Yes Tiffany L Reed, DO  lipase/protease/amylase (CREON) 36000 UNITS CPEP capsule Take 36,000 Units by mouth 3 (three) times daily before meals.   Yes Historical Provider, MD  memantine (NAMENDA XR) 28 MG CP24 24 hr capsule Take 28 mg by mouth daily.   Yes Historical Provider, MD  metFORMIN (GLUCOPHAGE) 500 MG tablet Take 500 mg by mouth 2 (two) times daily with a meal.   Yes Historical Provider, MD  nystatin cream (MYCOSTATIN) Apply 1 application topically 2 (two) times daily as needed (for rash/redness). Pt applies under breasts/stomach folds.   Yes Historical Provider, MD  potassium chloride SA (K-DUR,KLOR-CON) 20 MEQ tablet Take 20 mEq by mouth 2 (two) times daily.   Yes Historical Provider, MD  ramipril (ALTACE) 10 MG capsule Take 10 mg by mouth daily.   Yes Historical Provider, MD  risperiDONE (RISPERDAL) 0.25 MG tablet Take 0.25 mg by mouth at bedtime.   Yes Historical Provider, MD    Family History No family history on file.  Social History Social History  Substance Use Topics  . Smoking status: Former Smoker    Packs/day: 1.00    Years:  40.00    Types: Cigarettes  . Smokeless tobacco: Former Neurosurgeon    Quit date: 05/03/2013  . Alcohol use No     Allergies   Buspar [buspirone]; Percocet [oxycodone-acetaminophen]; and Vicodin [hydrocodone-acetaminophen]   Review of Systems Review of Systems  Unable to perform ROS: Dementia  Gastrointestinal: Positive for abdominal pain.     Physical Exam Updated Vital Signs BP 174/96 (BP Location: Left Arm)   Pulse 66   Temp 98.2 F (36.8 C) (Oral)   Resp 16   Ht 5\' 2"  (1.575 m)   Wt 185 lb (83.9 kg)   SpO2 98%   BMI 33.84 kg/m   Physical Exam  Constitutional: She appears well-developed and well-nourished. No distress.  HENT:  Head: Normocephalic and atraumatic.  Moist mucous membranes  Eyes: Conjunctivae are normal.  Neck: Neck supple.  Cardiovascular: Normal rate, regular rhythm and normal heart sounds.   No murmur heard. Pulmonary/Chest: Effort normal and breath sounds normal.  Abdominal: Soft. Bowel sounds are normal. She exhibits no distension. There is no tenderness.  Musculoskeletal: She exhibits no edema.  Neurological: She is alert.  Oriented to person and place, Fluent speech  Skin: Skin is warm and dry.  Psychiatric: She has a normal mood and affect.  Nursing note and vitals reviewed.    ED Treatments / Results  Labs (all labs ordered are listed, but only abnormal results are displayed) Labs Reviewed  COMPREHENSIVE METABOLIC PANEL - Abnormal; Notable for the following:       Result Value   Sodium 133 (*)    Glucose, Bld 114 (*)    All other components within normal limits  CBC - Abnormal; Notable for the following:    WBC 11.9 (*)    Hemoglobin 11.8 (*)    HCT 35.9 (*)    All other components within normal limits  URINALYSIS, ROUTINE W REFLEX MICROSCOPIC (NOT AT [facility]) - Abnormal; Notable for the following:    APPearance CLOUDY (*)    Leukocytes, UA SMALL (*)    All other components within normal limits  URINE MICROSCOPIC-ADD ON - Abnormal;  Notable for the following:    Squamous Epithelial / LPF 0-5 (*)    Bacteria, UA MANY (*)    All other components within normal limits  URINE CULTURE  LIPASE, BLOOD    EKG  EKG Interpretation None       Radiology Ct Abdomen Pelvis W Contrast  Result Date: 06/14/2016 CLINICAL DATA:  Recent small bowel obstruction. Lower abdominal pain and bloating. Recent hospital admission, discharged yesterday. EXAM: CT ABDOMEN AND PELVIS WITH CONTRAST TECHNIQUE: Multidetector CT imaging of the abdomen and pelvis was performed using the standard protocol following bolus administration of intravenous contrast. CONTRAST:  ISOVUE-300 IOPAMIDOL (ISOVUE-300) INJECTION 61% COMPARISON:  CTA 161096 week prior FINDINGS: Lower chest: Minimal dependent atelectasis. Coronary artery calcifications. No pleural fluid. Liver: No focal lesion. Punctate calcification adjacent of the right lobe. Hepatobiliary: Clips in the gallbladder fossa postcholecystectomy. Common bile duct is normal, mild intrahepatic biliary dilatation likely sequela of prior cholecystectomy. Pancreas: No ductal dilatation or inflammation. Spleen: Normal. Adrenal glands: No nodule. Kidneys: Symmetric renal enhancement. No hydronephrosis. 13 mm cyst in the upper right kidney is unchanged. Exophytic 19 mm cyst from the upper left kidney is also unchanged. Symmetric renal excretion on delayed phase imaging. Partially duplicated left renal collecting system. Stomach/Bowel: Stomach is decompressed. The previous prominent fluid-filled small bowel loops on prior exam have resolved. No dilated bowel. No evidence of obstruction. Multifocal colonic diverticulosis most prominent distally. No diverticulitis. Small volume of stool throughout the colon without colonic wall thickening. The appendix is normal. Vascular/Lymphatic: No retroperitoneal adenopathy. Abdominal aorta is normal in caliber. Dense aortic atherosclerosis without aneurysm. Ectatic infrarenal abdominal  aorta maximal dimension 2.8 cm is unchanged. Reproductive: Adnexal structures suboptimally defined by CT. Question atrophic uterus versus prior hysterectomy. Previous small cyst in the right ovary is unchanged. Bladder: Minimally distended, no wall thickening. Other: No free air, free fluid, or intra-abdominal fluid collection. Previous free fluid in the pelvis has resolved. Musculoskeletal: There are no acute or suspicious osseous abnormalities. Advanced multilevel degenerative change in the spine. Chronic compression deformities in the spine are stable. IMPRESSION: 1. Resolve small bowel obstruction. No recurrent small bowel dilatation or distention. 2. No acute abnormality. 3. Multifocal colonic diverticulosis without diverticulitis. 4. Abdominal atherosclerosis as well as coronary artery calcifications. Electronically Signed   By: Rubye Oaks M.D.   On: 06/14/2016 00:14    Procedures Procedures (including critical care time)  Medications Ordered in ED Medications  iopamidol (ISOVUE-300) 61 % injection 100 mL (100 mLs Intravenous Contrast Given 06/13/16 2355)     Initial Impression / Assessment and Plan / ED Course  I have reviewed the triage vital signs and the nursing notes.  Pertinent labs that were available during my care of the patient were reviewed by me and considered in my medical decision making (see chart for details).  Clinical Course  Comment By Time  CT shows no evidence of obstruction or acute findings. Laurence Spates, MD 08/05 [phone]  Patient with recent SBO, discharged from  OSH yesterday after medical management, as in for evaluation of ongoing bloating that she has had since discharge. She reports BM and passing gas since discharge. No vomiting. She was well-appearing with reassuring vital signs. Normal bowel sounds and no abdominal tenderness.   UA shows small leukocytes, some squamous cells, 6-30 wbc's, many bacteria. Patient denies any urinary symptoms, therefore  sent urine culture. WBC 11.9, the remainder of her labs are unremarkable. Because of her recent bowel obstruction and complaints of pain  earlier today, obtained CT to rule out ongoing obstruction. CT shows resolution of the obstruction in no acute findings. I am reassured by the patient's well appearance, reassuring labs, and desire to eat. She has tolerated juice and crackers with no difficulty and wants to go home. I have reviewed return precautions and the patient's granddaughter has voiced understanding. Patient discharged in satisfactory condition.  Final Clinical Impressions(s) / ED Diagnoses   Final diagnoses:  None    New Prescriptions New Prescriptions   No medications on file     Laurence Spates, MD 06/14/16 [phone]

## 2016-06-15 LAB — URINE CULTURE: SPECIAL REQUESTS: NORMAL

## 2016-06-16 ENCOUNTER — Ambulatory Visit: Payer: Medicare Other | Admitting: Internal Medicine

## 2016-06-16 DIAGNOSIS — M170 Bilateral primary osteoarthritis of knee: Secondary | ICD-10-CM | POA: Diagnosis not present

## 2016-06-16 DIAGNOSIS — Z7984 Long term (current) use of oral hypoglycemic drugs: Secondary | ICD-10-CM | POA: Diagnosis not present

## 2016-06-16 DIAGNOSIS — Z5181 Encounter for therapeutic drug level monitoring: Secondary | ICD-10-CM | POA: Diagnosis not present

## 2016-06-16 DIAGNOSIS — I10 Essential (primary) hypertension: Secondary | ICD-10-CM | POA: Diagnosis not present

## 2016-06-16 DIAGNOSIS — L409 Psoriasis, unspecified: Secondary | ICD-10-CM | POA: Diagnosis not present

## 2016-06-16 DIAGNOSIS — E11618 Type 2 diabetes mellitus with other diabetic arthropathy: Secondary | ICD-10-CM | POA: Diagnosis not present

## 2016-06-16 DIAGNOSIS — J449 Chronic obstructive pulmonary disease, unspecified: Secondary | ICD-10-CM | POA: Diagnosis not present

## 2016-06-16 DIAGNOSIS — E785 Hyperlipidemia, unspecified: Secondary | ICD-10-CM | POA: Diagnosis not present

## 2016-06-17 DIAGNOSIS — E785 Hyperlipidemia, unspecified: Secondary | ICD-10-CM | POA: Diagnosis not present

## 2016-06-17 DIAGNOSIS — Z5181 Encounter for therapeutic drug level monitoring: Secondary | ICD-10-CM | POA: Diagnosis not present

## 2016-06-17 DIAGNOSIS — L409 Psoriasis, unspecified: Secondary | ICD-10-CM | POA: Diagnosis not present

## 2016-06-17 DIAGNOSIS — J449 Chronic obstructive pulmonary disease, unspecified: Secondary | ICD-10-CM | POA: Diagnosis not present

## 2016-06-17 DIAGNOSIS — Z7984 Long term (current) use of oral hypoglycemic drugs: Secondary | ICD-10-CM | POA: Diagnosis not present

## 2016-06-17 DIAGNOSIS — M170 Bilateral primary osteoarthritis of knee: Secondary | ICD-10-CM | POA: Diagnosis not present

## 2016-06-17 DIAGNOSIS — I10 Essential (primary) hypertension: Secondary | ICD-10-CM | POA: Diagnosis not present

## 2016-06-17 DIAGNOSIS — E11618 Type 2 diabetes mellitus with other diabetic arthropathy: Secondary | ICD-10-CM | POA: Diagnosis not present

## 2016-06-19 DIAGNOSIS — K5669 Other intestinal obstruction: Secondary | ICD-10-CM | POA: Diagnosis not present

## 2016-06-19 DIAGNOSIS — I10 Essential (primary) hypertension: Secondary | ICD-10-CM | POA: Diagnosis not present

## 2016-06-19 DIAGNOSIS — K219 Gastro-esophageal reflux disease without esophagitis: Secondary | ICD-10-CM | POA: Diagnosis not present

## 2016-06-19 DIAGNOSIS — R198 Other specified symptoms and signs involving the digestive system and abdomen: Secondary | ICD-10-CM | POA: Diagnosis not present

## 2016-06-20 DIAGNOSIS — E11618 Type 2 diabetes mellitus with other diabetic arthropathy: Secondary | ICD-10-CM | POA: Diagnosis not present

## 2016-06-20 DIAGNOSIS — L409 Psoriasis, unspecified: Secondary | ICD-10-CM | POA: Diagnosis not present

## 2016-06-20 DIAGNOSIS — I10 Essential (primary) hypertension: Secondary | ICD-10-CM | POA: Diagnosis not present

## 2016-06-20 DIAGNOSIS — E785 Hyperlipidemia, unspecified: Secondary | ICD-10-CM | POA: Diagnosis not present

## 2016-06-20 DIAGNOSIS — J449 Chronic obstructive pulmonary disease, unspecified: Secondary | ICD-10-CM | POA: Diagnosis not present

## 2016-06-20 DIAGNOSIS — M170 Bilateral primary osteoarthritis of knee: Secondary | ICD-10-CM | POA: Diagnosis not present

## 2016-06-20 DIAGNOSIS — Z5181 Encounter for therapeutic drug level monitoring: Secondary | ICD-10-CM | POA: Diagnosis not present

## 2016-06-20 DIAGNOSIS — Z7984 Long term (current) use of oral hypoglycemic drugs: Secondary | ICD-10-CM | POA: Diagnosis not present

## 2016-06-23 DIAGNOSIS — Z5181 Encounter for therapeutic drug level monitoring: Secondary | ICD-10-CM | POA: Diagnosis not present

## 2016-06-23 DIAGNOSIS — L409 Psoriasis, unspecified: Secondary | ICD-10-CM | POA: Diagnosis not present

## 2016-06-23 DIAGNOSIS — E11618 Type 2 diabetes mellitus with other diabetic arthropathy: Secondary | ICD-10-CM | POA: Diagnosis not present

## 2016-06-23 DIAGNOSIS — J449 Chronic obstructive pulmonary disease, unspecified: Secondary | ICD-10-CM | POA: Diagnosis not present

## 2016-06-23 DIAGNOSIS — I10 Essential (primary) hypertension: Secondary | ICD-10-CM | POA: Diagnosis not present

## 2016-06-23 DIAGNOSIS — M170 Bilateral primary osteoarthritis of knee: Secondary | ICD-10-CM | POA: Diagnosis not present

## 2016-06-23 DIAGNOSIS — Z7984 Long term (current) use of oral hypoglycemic drugs: Secondary | ICD-10-CM | POA: Diagnosis not present

## 2016-06-23 DIAGNOSIS — E785 Hyperlipidemia, unspecified: Secondary | ICD-10-CM | POA: Diagnosis not present

## 2016-06-24 DIAGNOSIS — Z7984 Long term (current) use of oral hypoglycemic drugs: Secondary | ICD-10-CM | POA: Diagnosis not present

## 2016-06-24 DIAGNOSIS — I10 Essential (primary) hypertension: Secondary | ICD-10-CM | POA: Diagnosis not present

## 2016-06-24 DIAGNOSIS — E785 Hyperlipidemia, unspecified: Secondary | ICD-10-CM | POA: Diagnosis not present

## 2016-06-24 DIAGNOSIS — Z5181 Encounter for therapeutic drug level monitoring: Secondary | ICD-10-CM | POA: Diagnosis not present

## 2016-06-24 DIAGNOSIS — J449 Chronic obstructive pulmonary disease, unspecified: Secondary | ICD-10-CM | POA: Diagnosis not present

## 2016-06-24 DIAGNOSIS — M170 Bilateral primary osteoarthritis of knee: Secondary | ICD-10-CM | POA: Diagnosis not present

## 2016-06-24 DIAGNOSIS — L409 Psoriasis, unspecified: Secondary | ICD-10-CM | POA: Diagnosis not present

## 2016-06-24 DIAGNOSIS — E11618 Type 2 diabetes mellitus with other diabetic arthropathy: Secondary | ICD-10-CM | POA: Diagnosis not present

## 2016-06-25 DIAGNOSIS — Z7984 Long term (current) use of oral hypoglycemic drugs: Secondary | ICD-10-CM | POA: Diagnosis not present

## 2016-06-25 DIAGNOSIS — L409 Psoriasis, unspecified: Secondary | ICD-10-CM | POA: Diagnosis not present

## 2016-06-25 DIAGNOSIS — I10 Essential (primary) hypertension: Secondary | ICD-10-CM | POA: Diagnosis not present

## 2016-06-25 DIAGNOSIS — E11618 Type 2 diabetes mellitus with other diabetic arthropathy: Secondary | ICD-10-CM | POA: Diagnosis not present

## 2016-06-25 DIAGNOSIS — Z5181 Encounter for therapeutic drug level monitoring: Secondary | ICD-10-CM | POA: Diagnosis not present

## 2016-06-25 DIAGNOSIS — K5669 Other intestinal obstruction: Secondary | ICD-10-CM | POA: Diagnosis not present

## 2016-06-25 DIAGNOSIS — Z5189 Encounter for other specified aftercare: Secondary | ICD-10-CM | POA: Diagnosis not present

## 2016-06-25 DIAGNOSIS — E785 Hyperlipidemia, unspecified: Secondary | ICD-10-CM | POA: Diagnosis not present

## 2016-06-25 DIAGNOSIS — M170 Bilateral primary osteoarthritis of knee: Secondary | ICD-10-CM | POA: Diagnosis not present

## 2016-06-25 DIAGNOSIS — J449 Chronic obstructive pulmonary disease, unspecified: Secondary | ICD-10-CM | POA: Diagnosis not present

## 2016-06-25 DIAGNOSIS — K219 Gastro-esophageal reflux disease without esophagitis: Secondary | ICD-10-CM | POA: Diagnosis not present

## 2016-06-25 DIAGNOSIS — F0150 Vascular dementia without behavioral disturbance: Secondary | ICD-10-CM | POA: Diagnosis not present

## 2016-06-26 DIAGNOSIS — E785 Hyperlipidemia, unspecified: Secondary | ICD-10-CM | POA: Diagnosis not present

## 2016-06-26 DIAGNOSIS — E11618 Type 2 diabetes mellitus with other diabetic arthropathy: Secondary | ICD-10-CM | POA: Diagnosis not present

## 2016-06-26 DIAGNOSIS — M170 Bilateral primary osteoarthritis of knee: Secondary | ICD-10-CM | POA: Diagnosis not present

## 2016-06-26 DIAGNOSIS — Z7984 Long term (current) use of oral hypoglycemic drugs: Secondary | ICD-10-CM | POA: Diagnosis not present

## 2016-06-26 DIAGNOSIS — Z5181 Encounter for therapeutic drug level monitoring: Secondary | ICD-10-CM | POA: Diagnosis not present

## 2016-06-26 DIAGNOSIS — L409 Psoriasis, unspecified: Secondary | ICD-10-CM | POA: Diagnosis not present

## 2016-06-26 DIAGNOSIS — J449 Chronic obstructive pulmonary disease, unspecified: Secondary | ICD-10-CM | POA: Diagnosis not present

## 2016-06-26 DIAGNOSIS — I10 Essential (primary) hypertension: Secondary | ICD-10-CM | POA: Diagnosis not present

## 2016-06-27 DIAGNOSIS — I10 Essential (primary) hypertension: Secondary | ICD-10-CM | POA: Diagnosis not present

## 2016-06-27 DIAGNOSIS — Z5181 Encounter for therapeutic drug level monitoring: Secondary | ICD-10-CM | POA: Diagnosis not present

## 2016-06-27 DIAGNOSIS — M170 Bilateral primary osteoarthritis of knee: Secondary | ICD-10-CM | POA: Diagnosis not present

## 2016-06-27 DIAGNOSIS — E11618 Type 2 diabetes mellitus with other diabetic arthropathy: Secondary | ICD-10-CM | POA: Diagnosis not present

## 2016-06-27 DIAGNOSIS — L409 Psoriasis, unspecified: Secondary | ICD-10-CM | POA: Diagnosis not present

## 2016-06-27 DIAGNOSIS — J449 Chronic obstructive pulmonary disease, unspecified: Secondary | ICD-10-CM | POA: Diagnosis not present

## 2016-06-27 DIAGNOSIS — Z7984 Long term (current) use of oral hypoglycemic drugs: Secondary | ICD-10-CM | POA: Diagnosis not present

## 2016-06-27 DIAGNOSIS — E785 Hyperlipidemia, unspecified: Secondary | ICD-10-CM | POA: Diagnosis not present

## 2016-06-30 DIAGNOSIS — E11618 Type 2 diabetes mellitus with other diabetic arthropathy: Secondary | ICD-10-CM | POA: Diagnosis not present

## 2016-06-30 DIAGNOSIS — I10 Essential (primary) hypertension: Secondary | ICD-10-CM | POA: Diagnosis not present

## 2016-06-30 DIAGNOSIS — L409 Psoriasis, unspecified: Secondary | ICD-10-CM | POA: Diagnosis not present

## 2016-06-30 DIAGNOSIS — M170 Bilateral primary osteoarthritis of knee: Secondary | ICD-10-CM | POA: Diagnosis not present

## 2016-06-30 DIAGNOSIS — Z7984 Long term (current) use of oral hypoglycemic drugs: Secondary | ICD-10-CM | POA: Diagnosis not present

## 2016-06-30 DIAGNOSIS — Z5181 Encounter for therapeutic drug level monitoring: Secondary | ICD-10-CM | POA: Diagnosis not present

## 2016-06-30 DIAGNOSIS — J449 Chronic obstructive pulmonary disease, unspecified: Secondary | ICD-10-CM | POA: Diagnosis not present

## 2016-06-30 DIAGNOSIS — E785 Hyperlipidemia, unspecified: Secondary | ICD-10-CM | POA: Diagnosis not present

## 2016-07-01 DIAGNOSIS — E785 Hyperlipidemia, unspecified: Secondary | ICD-10-CM | POA: Diagnosis not present

## 2016-07-01 DIAGNOSIS — Z7984 Long term (current) use of oral hypoglycemic drugs: Secondary | ICD-10-CM | POA: Diagnosis not present

## 2016-07-01 DIAGNOSIS — L409 Psoriasis, unspecified: Secondary | ICD-10-CM | POA: Diagnosis not present

## 2016-07-01 DIAGNOSIS — I10 Essential (primary) hypertension: Secondary | ICD-10-CM | POA: Diagnosis not present

## 2016-07-01 DIAGNOSIS — E11618 Type 2 diabetes mellitus with other diabetic arthropathy: Secondary | ICD-10-CM | POA: Diagnosis not present

## 2016-07-01 DIAGNOSIS — M170 Bilateral primary osteoarthritis of knee: Secondary | ICD-10-CM | POA: Diagnosis not present

## 2016-07-01 DIAGNOSIS — Z5181 Encounter for therapeutic drug level monitoring: Secondary | ICD-10-CM | POA: Diagnosis not present

## 2016-07-01 DIAGNOSIS — J449 Chronic obstructive pulmonary disease, unspecified: Secondary | ICD-10-CM | POA: Diagnosis not present

## 2016-07-02 DIAGNOSIS — K5669 Other intestinal obstruction: Secondary | ICD-10-CM | POA: Diagnosis not present

## 2016-07-02 DIAGNOSIS — N39498 Other specified urinary incontinence: Secondary | ICD-10-CM | POA: Diagnosis not present

## 2016-07-03 DIAGNOSIS — E785 Hyperlipidemia, unspecified: Secondary | ICD-10-CM | POA: Diagnosis not present

## 2016-07-03 DIAGNOSIS — J449 Chronic obstructive pulmonary disease, unspecified: Secondary | ICD-10-CM | POA: Diagnosis not present

## 2016-07-03 DIAGNOSIS — E11618 Type 2 diabetes mellitus with other diabetic arthropathy: Secondary | ICD-10-CM | POA: Diagnosis not present

## 2016-07-03 DIAGNOSIS — M170 Bilateral primary osteoarthritis of knee: Secondary | ICD-10-CM | POA: Diagnosis not present

## 2016-07-03 DIAGNOSIS — L409 Psoriasis, unspecified: Secondary | ICD-10-CM | POA: Diagnosis not present

## 2016-07-03 DIAGNOSIS — Z7984 Long term (current) use of oral hypoglycemic drugs: Secondary | ICD-10-CM | POA: Diagnosis not present

## 2016-07-03 DIAGNOSIS — I10 Essential (primary) hypertension: Secondary | ICD-10-CM | POA: Diagnosis not present

## 2016-07-03 DIAGNOSIS — Z5181 Encounter for therapeutic drug level monitoring: Secondary | ICD-10-CM | POA: Diagnosis not present

## 2016-07-04 DIAGNOSIS — E785 Hyperlipidemia, unspecified: Secondary | ICD-10-CM | POA: Diagnosis not present

## 2016-07-04 DIAGNOSIS — I10 Essential (primary) hypertension: Secondary | ICD-10-CM | POA: Diagnosis not present

## 2016-07-04 DIAGNOSIS — L409 Psoriasis, unspecified: Secondary | ICD-10-CM | POA: Diagnosis not present

## 2016-07-04 DIAGNOSIS — J449 Chronic obstructive pulmonary disease, unspecified: Secondary | ICD-10-CM | POA: Diagnosis not present

## 2016-07-04 DIAGNOSIS — Z7984 Long term (current) use of oral hypoglycemic drugs: Secondary | ICD-10-CM | POA: Diagnosis not present

## 2016-07-04 DIAGNOSIS — Z5181 Encounter for therapeutic drug level monitoring: Secondary | ICD-10-CM | POA: Diagnosis not present

## 2016-07-04 DIAGNOSIS — M170 Bilateral primary osteoarthritis of knee: Secondary | ICD-10-CM | POA: Diagnosis not present

## 2016-07-04 DIAGNOSIS — E11618 Type 2 diabetes mellitus with other diabetic arthropathy: Secondary | ICD-10-CM | POA: Diagnosis not present

## 2016-07-07 DIAGNOSIS — I10 Essential (primary) hypertension: Secondary | ICD-10-CM | POA: Diagnosis not present

## 2016-07-07 DIAGNOSIS — L409 Psoriasis, unspecified: Secondary | ICD-10-CM | POA: Diagnosis not present

## 2016-07-07 DIAGNOSIS — M170 Bilateral primary osteoarthritis of knee: Secondary | ICD-10-CM | POA: Diagnosis not present

## 2016-07-07 DIAGNOSIS — E11618 Type 2 diabetes mellitus with other diabetic arthropathy: Secondary | ICD-10-CM | POA: Diagnosis not present

## 2016-07-07 DIAGNOSIS — Z5181 Encounter for therapeutic drug level monitoring: Secondary | ICD-10-CM | POA: Diagnosis not present

## 2016-07-07 DIAGNOSIS — J449 Chronic obstructive pulmonary disease, unspecified: Secondary | ICD-10-CM | POA: Diagnosis not present

## 2016-07-07 DIAGNOSIS — Z7984 Long term (current) use of oral hypoglycemic drugs: Secondary | ICD-10-CM | POA: Diagnosis not present

## 2016-07-07 DIAGNOSIS — E785 Hyperlipidemia, unspecified: Secondary | ICD-10-CM | POA: Diagnosis not present

## 2016-07-08 DIAGNOSIS — Z5181 Encounter for therapeutic drug level monitoring: Secondary | ICD-10-CM | POA: Diagnosis not present

## 2016-07-08 DIAGNOSIS — Z7984 Long term (current) use of oral hypoglycemic drugs: Secondary | ICD-10-CM | POA: Diagnosis not present

## 2016-07-08 DIAGNOSIS — L409 Psoriasis, unspecified: Secondary | ICD-10-CM | POA: Diagnosis not present

## 2016-07-08 DIAGNOSIS — M170 Bilateral primary osteoarthritis of knee: Secondary | ICD-10-CM | POA: Diagnosis not present

## 2016-07-08 DIAGNOSIS — I10 Essential (primary) hypertension: Secondary | ICD-10-CM | POA: Diagnosis not present

## 2016-07-08 DIAGNOSIS — E785 Hyperlipidemia, unspecified: Secondary | ICD-10-CM | POA: Diagnosis not present

## 2016-07-08 DIAGNOSIS — J449 Chronic obstructive pulmonary disease, unspecified: Secondary | ICD-10-CM | POA: Diagnosis not present

## 2016-07-08 DIAGNOSIS — E11618 Type 2 diabetes mellitus with other diabetic arthropathy: Secondary | ICD-10-CM | POA: Diagnosis not present

## 2016-07-09 DIAGNOSIS — E119 Type 2 diabetes mellitus without complications: Secondary | ICD-10-CM | POA: Diagnosis not present

## 2016-07-09 DIAGNOSIS — K219 Gastro-esophageal reflux disease without esophagitis: Secondary | ICD-10-CM | POA: Diagnosis not present

## 2016-07-10 DIAGNOSIS — I10 Essential (primary) hypertension: Secondary | ICD-10-CM | POA: Diagnosis not present

## 2016-07-10 DIAGNOSIS — E11618 Type 2 diabetes mellitus with other diabetic arthropathy: Secondary | ICD-10-CM | POA: Diagnosis not present

## 2016-07-10 DIAGNOSIS — M170 Bilateral primary osteoarthritis of knee: Secondary | ICD-10-CM | POA: Diagnosis not present

## 2016-07-10 DIAGNOSIS — J449 Chronic obstructive pulmonary disease, unspecified: Secondary | ICD-10-CM | POA: Diagnosis not present

## 2016-07-10 DIAGNOSIS — L409 Psoriasis, unspecified: Secondary | ICD-10-CM | POA: Diagnosis not present

## 2016-07-10 DIAGNOSIS — Z5181 Encounter for therapeutic drug level monitoring: Secondary | ICD-10-CM | POA: Diagnosis not present

## 2016-07-10 DIAGNOSIS — E785 Hyperlipidemia, unspecified: Secondary | ICD-10-CM | POA: Diagnosis not present

## 2016-07-10 DIAGNOSIS — Z7984 Long term (current) use of oral hypoglycemic drugs: Secondary | ICD-10-CM | POA: Diagnosis not present

## 2016-07-11 DIAGNOSIS — Z7984 Long term (current) use of oral hypoglycemic drugs: Secondary | ICD-10-CM | POA: Diagnosis not present

## 2016-07-11 DIAGNOSIS — M170 Bilateral primary osteoarthritis of knee: Secondary | ICD-10-CM | POA: Diagnosis not present

## 2016-07-11 DIAGNOSIS — I10 Essential (primary) hypertension: Secondary | ICD-10-CM | POA: Diagnosis not present

## 2016-07-11 DIAGNOSIS — E11618 Type 2 diabetes mellitus with other diabetic arthropathy: Secondary | ICD-10-CM | POA: Diagnosis not present

## 2016-07-11 DIAGNOSIS — J449 Chronic obstructive pulmonary disease, unspecified: Secondary | ICD-10-CM | POA: Diagnosis not present

## 2016-07-11 DIAGNOSIS — L409 Psoriasis, unspecified: Secondary | ICD-10-CM | POA: Diagnosis not present

## 2016-07-11 DIAGNOSIS — E785 Hyperlipidemia, unspecified: Secondary | ICD-10-CM | POA: Diagnosis not present

## 2016-07-11 DIAGNOSIS — Z5181 Encounter for therapeutic drug level monitoring: Secondary | ICD-10-CM | POA: Diagnosis not present

## 2016-07-15 DIAGNOSIS — I10 Essential (primary) hypertension: Secondary | ICD-10-CM | POA: Diagnosis not present

## 2016-07-15 DIAGNOSIS — E785 Hyperlipidemia, unspecified: Secondary | ICD-10-CM | POA: Diagnosis not present

## 2016-07-15 DIAGNOSIS — M170 Bilateral primary osteoarthritis of knee: Secondary | ICD-10-CM | POA: Diagnosis not present

## 2016-07-15 DIAGNOSIS — J449 Chronic obstructive pulmonary disease, unspecified: Secondary | ICD-10-CM | POA: Diagnosis not present

## 2016-07-15 DIAGNOSIS — L409 Psoriasis, unspecified: Secondary | ICD-10-CM | POA: Diagnosis not present

## 2016-07-15 DIAGNOSIS — Z5181 Encounter for therapeutic drug level monitoring: Secondary | ICD-10-CM | POA: Diagnosis not present

## 2016-07-15 DIAGNOSIS — E11618 Type 2 diabetes mellitus with other diabetic arthropathy: Secondary | ICD-10-CM | POA: Diagnosis not present

## 2016-07-15 DIAGNOSIS — Z7984 Long term (current) use of oral hypoglycemic drugs: Secondary | ICD-10-CM | POA: Diagnosis not present

## 2016-07-16 DIAGNOSIS — M170 Bilateral primary osteoarthritis of knee: Secondary | ICD-10-CM | POA: Diagnosis not present

## 2016-07-16 DIAGNOSIS — Z5181 Encounter for therapeutic drug level monitoring: Secondary | ICD-10-CM | POA: Diagnosis not present

## 2016-07-16 DIAGNOSIS — L409 Psoriasis, unspecified: Secondary | ICD-10-CM | POA: Diagnosis not present

## 2016-07-16 DIAGNOSIS — I10 Essential (primary) hypertension: Secondary | ICD-10-CM | POA: Diagnosis not present

## 2016-07-16 DIAGNOSIS — E785 Hyperlipidemia, unspecified: Secondary | ICD-10-CM | POA: Diagnosis not present

## 2016-07-16 DIAGNOSIS — F0150 Vascular dementia without behavioral disturbance: Secondary | ICD-10-CM | POA: Diagnosis not present

## 2016-07-16 DIAGNOSIS — J449 Chronic obstructive pulmonary disease, unspecified: Secondary | ICD-10-CM | POA: Diagnosis not present

## 2016-07-16 DIAGNOSIS — E11618 Type 2 diabetes mellitus with other diabetic arthropathy: Secondary | ICD-10-CM | POA: Diagnosis not present

## 2016-07-16 DIAGNOSIS — Z7984 Long term (current) use of oral hypoglycemic drugs: Secondary | ICD-10-CM | POA: Diagnosis not present

## 2016-07-16 DIAGNOSIS — R198 Other specified symptoms and signs involving the digestive system and abdomen: Secondary | ICD-10-CM | POA: Diagnosis not present

## 2016-07-17 DIAGNOSIS — E11618 Type 2 diabetes mellitus with other diabetic arthropathy: Secondary | ICD-10-CM | POA: Diagnosis not present

## 2016-07-17 DIAGNOSIS — K5900 Constipation, unspecified: Secondary | ICD-10-CM | POA: Diagnosis not present

## 2016-07-17 DIAGNOSIS — E785 Hyperlipidemia, unspecified: Secondary | ICD-10-CM | POA: Diagnosis not present

## 2016-07-17 DIAGNOSIS — M170 Bilateral primary osteoarthritis of knee: Secondary | ICD-10-CM | POA: Diagnosis not present

## 2016-07-17 DIAGNOSIS — Z1211 Encounter for screening for malignant neoplasm of colon: Secondary | ICD-10-CM | POA: Diagnosis not present

## 2016-07-17 DIAGNOSIS — Z7984 Long term (current) use of oral hypoglycemic drugs: Secondary | ICD-10-CM | POA: Diagnosis not present

## 2016-07-17 DIAGNOSIS — Z5181 Encounter for therapeutic drug level monitoring: Secondary | ICD-10-CM | POA: Diagnosis not present

## 2016-07-17 DIAGNOSIS — I10 Essential (primary) hypertension: Secondary | ICD-10-CM | POA: Diagnosis not present

## 2016-07-17 DIAGNOSIS — L409 Psoriasis, unspecified: Secondary | ICD-10-CM | POA: Diagnosis not present

## 2016-07-17 DIAGNOSIS — J449 Chronic obstructive pulmonary disease, unspecified: Secondary | ICD-10-CM | POA: Diagnosis not present

## 2016-07-19 DIAGNOSIS — J449 Chronic obstructive pulmonary disease, unspecified: Secondary | ICD-10-CM | POA: Diagnosis not present

## 2016-07-19 DIAGNOSIS — E11618 Type 2 diabetes mellitus with other diabetic arthropathy: Secondary | ICD-10-CM | POA: Diagnosis not present

## 2016-07-19 DIAGNOSIS — Z5181 Encounter for therapeutic drug level monitoring: Secondary | ICD-10-CM | POA: Diagnosis not present

## 2016-07-19 DIAGNOSIS — E785 Hyperlipidemia, unspecified: Secondary | ICD-10-CM | POA: Diagnosis not present

## 2016-07-19 DIAGNOSIS — Z7984 Long term (current) use of oral hypoglycemic drugs: Secondary | ICD-10-CM | POA: Diagnosis not present

## 2016-07-19 DIAGNOSIS — M170 Bilateral primary osteoarthritis of knee: Secondary | ICD-10-CM | POA: Diagnosis not present

## 2016-07-19 DIAGNOSIS — L409 Psoriasis, unspecified: Secondary | ICD-10-CM | POA: Diagnosis not present

## 2016-07-19 DIAGNOSIS — I10 Essential (primary) hypertension: Secondary | ICD-10-CM | POA: Diagnosis not present

## 2016-07-21 DIAGNOSIS — M170 Bilateral primary osteoarthritis of knee: Secondary | ICD-10-CM | POA: Diagnosis not present

## 2016-07-21 DIAGNOSIS — L409 Psoriasis, unspecified: Secondary | ICD-10-CM | POA: Diagnosis not present

## 2016-07-21 DIAGNOSIS — Z7984 Long term (current) use of oral hypoglycemic drugs: Secondary | ICD-10-CM | POA: Diagnosis not present

## 2016-07-21 DIAGNOSIS — I10 Essential (primary) hypertension: Secondary | ICD-10-CM | POA: Diagnosis not present

## 2016-07-21 DIAGNOSIS — Z5181 Encounter for therapeutic drug level monitoring: Secondary | ICD-10-CM | POA: Diagnosis not present

## 2016-07-21 DIAGNOSIS — E11618 Type 2 diabetes mellitus with other diabetic arthropathy: Secondary | ICD-10-CM | POA: Diagnosis not present

## 2016-07-21 DIAGNOSIS — E785 Hyperlipidemia, unspecified: Secondary | ICD-10-CM | POA: Diagnosis not present

## 2016-07-21 DIAGNOSIS — J449 Chronic obstructive pulmonary disease, unspecified: Secondary | ICD-10-CM | POA: Diagnosis not present

## 2016-07-22 DIAGNOSIS — Z5181 Encounter for therapeutic drug level monitoring: Secondary | ICD-10-CM | POA: Diagnosis not present

## 2016-07-22 DIAGNOSIS — E11618 Type 2 diabetes mellitus with other diabetic arthropathy: Secondary | ICD-10-CM | POA: Diagnosis not present

## 2016-07-22 DIAGNOSIS — L409 Psoriasis, unspecified: Secondary | ICD-10-CM | POA: Diagnosis not present

## 2016-07-22 DIAGNOSIS — Z7984 Long term (current) use of oral hypoglycemic drugs: Secondary | ICD-10-CM | POA: Diagnosis not present

## 2016-07-22 DIAGNOSIS — I10 Essential (primary) hypertension: Secondary | ICD-10-CM | POA: Diagnosis not present

## 2016-07-22 DIAGNOSIS — E785 Hyperlipidemia, unspecified: Secondary | ICD-10-CM | POA: Diagnosis not present

## 2016-07-22 DIAGNOSIS — M170 Bilateral primary osteoarthritis of knee: Secondary | ICD-10-CM | POA: Diagnosis not present

## 2016-07-22 DIAGNOSIS — J449 Chronic obstructive pulmonary disease, unspecified: Secondary | ICD-10-CM | POA: Diagnosis not present

## 2016-07-23 DIAGNOSIS — L409 Psoriasis, unspecified: Secondary | ICD-10-CM | POA: Diagnosis not present

## 2016-07-23 DIAGNOSIS — E11618 Type 2 diabetes mellitus with other diabetic arthropathy: Secondary | ICD-10-CM | POA: Diagnosis not present

## 2016-07-23 DIAGNOSIS — J449 Chronic obstructive pulmonary disease, unspecified: Secondary | ICD-10-CM | POA: Diagnosis not present

## 2016-07-23 DIAGNOSIS — Z5181 Encounter for therapeutic drug level monitoring: Secondary | ICD-10-CM | POA: Diagnosis not present

## 2016-07-23 DIAGNOSIS — Z7984 Long term (current) use of oral hypoglycemic drugs: Secondary | ICD-10-CM | POA: Diagnosis not present

## 2016-07-23 DIAGNOSIS — M170 Bilateral primary osteoarthritis of knee: Secondary | ICD-10-CM | POA: Diagnosis not present

## 2016-07-23 DIAGNOSIS — E785 Hyperlipidemia, unspecified: Secondary | ICD-10-CM | POA: Diagnosis not present

## 2016-07-23 DIAGNOSIS — I10 Essential (primary) hypertension: Secondary | ICD-10-CM | POA: Diagnosis not present

## 2016-07-24 DIAGNOSIS — Z5181 Encounter for therapeutic drug level monitoring: Secondary | ICD-10-CM | POA: Diagnosis not present

## 2016-07-24 DIAGNOSIS — M170 Bilateral primary osteoarthritis of knee: Secondary | ICD-10-CM | POA: Diagnosis not present

## 2016-07-24 DIAGNOSIS — L409 Psoriasis, unspecified: Secondary | ICD-10-CM | POA: Diagnosis not present

## 2016-07-24 DIAGNOSIS — E11618 Type 2 diabetes mellitus with other diabetic arthropathy: Secondary | ICD-10-CM | POA: Diagnosis not present

## 2016-07-24 DIAGNOSIS — J449 Chronic obstructive pulmonary disease, unspecified: Secondary | ICD-10-CM | POA: Diagnosis not present

## 2016-07-24 DIAGNOSIS — Z7984 Long term (current) use of oral hypoglycemic drugs: Secondary | ICD-10-CM | POA: Diagnosis not present

## 2016-07-24 DIAGNOSIS — E785 Hyperlipidemia, unspecified: Secondary | ICD-10-CM | POA: Diagnosis not present

## 2016-07-24 DIAGNOSIS — I10 Essential (primary) hypertension: Secondary | ICD-10-CM | POA: Diagnosis not present

## 2016-07-28 DIAGNOSIS — G300 Alzheimer's disease with early onset: Secondary | ICD-10-CM | POA: Diagnosis not present

## 2016-07-28 DIAGNOSIS — M170 Bilateral primary osteoarthritis of knee: Secondary | ICD-10-CM | POA: Diagnosis not present

## 2016-07-28 DIAGNOSIS — R451 Restlessness and agitation: Secondary | ICD-10-CM | POA: Diagnosis not present

## 2016-07-28 DIAGNOSIS — I10 Essential (primary) hypertension: Secondary | ICD-10-CM | POA: Diagnosis not present

## 2016-07-28 DIAGNOSIS — M6281 Muscle weakness (generalized): Secondary | ICD-10-CM | POA: Diagnosis not present

## 2016-07-28 DIAGNOSIS — Z741 Need for assistance with personal care: Secondary | ICD-10-CM | POA: Diagnosis not present

## 2016-07-28 DIAGNOSIS — Z7984 Long term (current) use of oral hypoglycemic drugs: Secondary | ICD-10-CM | POA: Diagnosis not present

## 2016-07-28 DIAGNOSIS — E11618 Type 2 diabetes mellitus with other diabetic arthropathy: Secondary | ICD-10-CM | POA: Diagnosis not present

## 2016-07-28 DIAGNOSIS — M25511 Pain in right shoulder: Secondary | ICD-10-CM | POA: Diagnosis not present

## 2016-07-28 DIAGNOSIS — L409 Psoriasis, unspecified: Secondary | ICD-10-CM | POA: Diagnosis not present

## 2016-07-28 DIAGNOSIS — Z79891 Long term (current) use of opiate analgesic: Secondary | ICD-10-CM | POA: Diagnosis not present

## 2016-07-29 DIAGNOSIS — M25511 Pain in right shoulder: Secondary | ICD-10-CM | POA: Diagnosis not present

## 2016-07-29 DIAGNOSIS — M6281 Muscle weakness (generalized): Secondary | ICD-10-CM | POA: Diagnosis not present

## 2016-07-29 DIAGNOSIS — I10 Essential (primary) hypertension: Secondary | ICD-10-CM | POA: Diagnosis not present

## 2016-07-29 DIAGNOSIS — Z7984 Long term (current) use of oral hypoglycemic drugs: Secondary | ICD-10-CM | POA: Diagnosis not present

## 2016-07-29 DIAGNOSIS — L409 Psoriasis, unspecified: Secondary | ICD-10-CM | POA: Diagnosis not present

## 2016-07-29 DIAGNOSIS — R451 Restlessness and agitation: Secondary | ICD-10-CM | POA: Diagnosis not present

## 2016-07-29 DIAGNOSIS — Z741 Need for assistance with personal care: Secondary | ICD-10-CM | POA: Diagnosis not present

## 2016-07-29 DIAGNOSIS — Z79891 Long term (current) use of opiate analgesic: Secondary | ICD-10-CM | POA: Diagnosis not present

## 2016-07-29 DIAGNOSIS — E11618 Type 2 diabetes mellitus with other diabetic arthropathy: Secondary | ICD-10-CM | POA: Diagnosis not present

## 2016-07-29 DIAGNOSIS — G300 Alzheimer's disease with early onset: Secondary | ICD-10-CM | POA: Diagnosis not present

## 2016-07-29 DIAGNOSIS — M170 Bilateral primary osteoarthritis of knee: Secondary | ICD-10-CM | POA: Diagnosis not present

## 2016-07-30 DIAGNOSIS — Z79891 Long term (current) use of opiate analgesic: Secondary | ICD-10-CM | POA: Diagnosis not present

## 2016-07-30 DIAGNOSIS — M170 Bilateral primary osteoarthritis of knee: Secondary | ICD-10-CM | POA: Diagnosis not present

## 2016-07-30 DIAGNOSIS — I10 Essential (primary) hypertension: Secondary | ICD-10-CM | POA: Diagnosis not present

## 2016-07-30 DIAGNOSIS — E11618 Type 2 diabetes mellitus with other diabetic arthropathy: Secondary | ICD-10-CM | POA: Diagnosis not present

## 2016-07-30 DIAGNOSIS — M6281 Muscle weakness (generalized): Secondary | ICD-10-CM | POA: Diagnosis not present

## 2016-07-30 DIAGNOSIS — Z7984 Long term (current) use of oral hypoglycemic drugs: Secondary | ICD-10-CM | POA: Diagnosis not present

## 2016-07-30 DIAGNOSIS — G300 Alzheimer's disease with early onset: Secondary | ICD-10-CM | POA: Diagnosis not present

## 2016-07-30 DIAGNOSIS — M25511 Pain in right shoulder: Secondary | ICD-10-CM | POA: Diagnosis not present

## 2016-07-30 DIAGNOSIS — R451 Restlessness and agitation: Secondary | ICD-10-CM | POA: Diagnosis not present

## 2016-07-30 DIAGNOSIS — L409 Psoriasis, unspecified: Secondary | ICD-10-CM | POA: Diagnosis not present

## 2016-07-30 DIAGNOSIS — Z741 Need for assistance with personal care: Secondary | ICD-10-CM | POA: Diagnosis not present

## 2016-07-31 DIAGNOSIS — Z7984 Long term (current) use of oral hypoglycemic drugs: Secondary | ICD-10-CM | POA: Diagnosis not present

## 2016-07-31 DIAGNOSIS — M25511 Pain in right shoulder: Secondary | ICD-10-CM | POA: Diagnosis not present

## 2016-07-31 DIAGNOSIS — G300 Alzheimer's disease with early onset: Secondary | ICD-10-CM | POA: Diagnosis not present

## 2016-07-31 DIAGNOSIS — I10 Essential (primary) hypertension: Secondary | ICD-10-CM | POA: Diagnosis not present

## 2016-07-31 DIAGNOSIS — E11618 Type 2 diabetes mellitus with other diabetic arthropathy: Secondary | ICD-10-CM | POA: Diagnosis not present

## 2016-07-31 DIAGNOSIS — Z79891 Long term (current) use of opiate analgesic: Secondary | ICD-10-CM | POA: Diagnosis not present

## 2016-07-31 DIAGNOSIS — Z1211 Encounter for screening for malignant neoplasm of colon: Secondary | ICD-10-CM | POA: Diagnosis not present

## 2016-07-31 DIAGNOSIS — Z01818 Encounter for other preprocedural examination: Secondary | ICD-10-CM | POA: Diagnosis not present

## 2016-07-31 DIAGNOSIS — L409 Psoriasis, unspecified: Secondary | ICD-10-CM | POA: Diagnosis not present

## 2016-07-31 DIAGNOSIS — M170 Bilateral primary osteoarthritis of knee: Secondary | ICD-10-CM | POA: Diagnosis not present

## 2016-07-31 DIAGNOSIS — Z741 Need for assistance with personal care: Secondary | ICD-10-CM | POA: Diagnosis not present

## 2016-07-31 DIAGNOSIS — R451 Restlessness and agitation: Secondary | ICD-10-CM | POA: Diagnosis not present

## 2016-07-31 DIAGNOSIS — M6281 Muscle weakness (generalized): Secondary | ICD-10-CM | POA: Diagnosis not present

## 2016-08-04 DIAGNOSIS — Z741 Need for assistance with personal care: Secondary | ICD-10-CM | POA: Diagnosis not present

## 2016-08-04 DIAGNOSIS — M6281 Muscle weakness (generalized): Secondary | ICD-10-CM | POA: Diagnosis not present

## 2016-08-04 DIAGNOSIS — G300 Alzheimer's disease with early onset: Secondary | ICD-10-CM | POA: Diagnosis not present

## 2016-08-04 DIAGNOSIS — M170 Bilateral primary osteoarthritis of knee: Secondary | ICD-10-CM | POA: Diagnosis not present

## 2016-08-04 DIAGNOSIS — E11618 Type 2 diabetes mellitus with other diabetic arthropathy: Secondary | ICD-10-CM | POA: Diagnosis not present

## 2016-08-04 DIAGNOSIS — R451 Restlessness and agitation: Secondary | ICD-10-CM | POA: Diagnosis not present

## 2016-08-04 DIAGNOSIS — Z79891 Long term (current) use of opiate analgesic: Secondary | ICD-10-CM | POA: Diagnosis not present

## 2016-08-04 DIAGNOSIS — M25511 Pain in right shoulder: Secondary | ICD-10-CM | POA: Diagnosis not present

## 2016-08-04 DIAGNOSIS — I10 Essential (primary) hypertension: Secondary | ICD-10-CM | POA: Diagnosis not present

## 2016-08-04 DIAGNOSIS — Z7984 Long term (current) use of oral hypoglycemic drugs: Secondary | ICD-10-CM | POA: Diagnosis not present

## 2016-08-04 DIAGNOSIS — L409 Psoriasis, unspecified: Secondary | ICD-10-CM | POA: Diagnosis not present

## 2016-08-05 DIAGNOSIS — Z7984 Long term (current) use of oral hypoglycemic drugs: Secondary | ICD-10-CM | POA: Diagnosis not present

## 2016-08-05 DIAGNOSIS — I10 Essential (primary) hypertension: Secondary | ICD-10-CM | POA: Diagnosis not present

## 2016-08-05 DIAGNOSIS — M170 Bilateral primary osteoarthritis of knee: Secondary | ICD-10-CM | POA: Diagnosis not present

## 2016-08-05 DIAGNOSIS — Z741 Need for assistance with personal care: Secondary | ICD-10-CM | POA: Diagnosis not present

## 2016-08-05 DIAGNOSIS — M25511 Pain in right shoulder: Secondary | ICD-10-CM | POA: Diagnosis not present

## 2016-08-05 DIAGNOSIS — G300 Alzheimer's disease with early onset: Secondary | ICD-10-CM | POA: Diagnosis not present

## 2016-08-05 DIAGNOSIS — R451 Restlessness and agitation: Secondary | ICD-10-CM | POA: Diagnosis not present

## 2016-08-05 DIAGNOSIS — L409 Psoriasis, unspecified: Secondary | ICD-10-CM | POA: Diagnosis not present

## 2016-08-05 DIAGNOSIS — M6281 Muscle weakness (generalized): Secondary | ICD-10-CM | POA: Diagnosis not present

## 2016-08-05 DIAGNOSIS — E11618 Type 2 diabetes mellitus with other diabetic arthropathy: Secondary | ICD-10-CM | POA: Diagnosis not present

## 2016-08-05 DIAGNOSIS — Z79891 Long term (current) use of opiate analgesic: Secondary | ICD-10-CM | POA: Diagnosis not present

## 2016-08-06 DIAGNOSIS — M170 Bilateral primary osteoarthritis of knee: Secondary | ICD-10-CM | POA: Diagnosis not present

## 2016-08-06 DIAGNOSIS — Z741 Need for assistance with personal care: Secondary | ICD-10-CM | POA: Diagnosis not present

## 2016-08-06 DIAGNOSIS — M6281 Muscle weakness (generalized): Secondary | ICD-10-CM | POA: Diagnosis not present

## 2016-08-06 DIAGNOSIS — G300 Alzheimer's disease with early onset: Secondary | ICD-10-CM | POA: Diagnosis not present

## 2016-08-06 DIAGNOSIS — Z7984 Long term (current) use of oral hypoglycemic drugs: Secondary | ICD-10-CM | POA: Diagnosis not present

## 2016-08-06 DIAGNOSIS — L409 Psoriasis, unspecified: Secondary | ICD-10-CM | POA: Diagnosis not present

## 2016-08-06 DIAGNOSIS — R451 Restlessness and agitation: Secondary | ICD-10-CM | POA: Diagnosis not present

## 2016-08-06 DIAGNOSIS — M25511 Pain in right shoulder: Secondary | ICD-10-CM | POA: Diagnosis not present

## 2016-08-06 DIAGNOSIS — Z79891 Long term (current) use of opiate analgesic: Secondary | ICD-10-CM | POA: Diagnosis not present

## 2016-08-06 DIAGNOSIS — I10 Essential (primary) hypertension: Secondary | ICD-10-CM | POA: Diagnosis not present

## 2016-08-06 DIAGNOSIS — E11618 Type 2 diabetes mellitus with other diabetic arthropathy: Secondary | ICD-10-CM | POA: Diagnosis not present

## 2016-08-07 DIAGNOSIS — M6281 Muscle weakness (generalized): Secondary | ICD-10-CM | POA: Diagnosis not present

## 2016-08-07 DIAGNOSIS — Z7984 Long term (current) use of oral hypoglycemic drugs: Secondary | ICD-10-CM | POA: Diagnosis not present

## 2016-08-07 DIAGNOSIS — E11618 Type 2 diabetes mellitus with other diabetic arthropathy: Secondary | ICD-10-CM | POA: Diagnosis not present

## 2016-08-07 DIAGNOSIS — L409 Psoriasis, unspecified: Secondary | ICD-10-CM | POA: Diagnosis not present

## 2016-08-07 DIAGNOSIS — R451 Restlessness and agitation: Secondary | ICD-10-CM | POA: Diagnosis not present

## 2016-08-07 DIAGNOSIS — Z741 Need for assistance with personal care: Secondary | ICD-10-CM | POA: Diagnosis not present

## 2016-08-07 DIAGNOSIS — G300 Alzheimer's disease with early onset: Secondary | ICD-10-CM | POA: Diagnosis not present

## 2016-08-07 DIAGNOSIS — I10 Essential (primary) hypertension: Secondary | ICD-10-CM | POA: Diagnosis not present

## 2016-08-07 DIAGNOSIS — Z79891 Long term (current) use of opiate analgesic: Secondary | ICD-10-CM | POA: Diagnosis not present

## 2016-08-07 DIAGNOSIS — M25511 Pain in right shoulder: Secondary | ICD-10-CM | POA: Diagnosis not present

## 2016-08-07 DIAGNOSIS — M170 Bilateral primary osteoarthritis of knee: Secondary | ICD-10-CM | POA: Diagnosis not present

## 2016-08-12 DIAGNOSIS — L409 Psoriasis, unspecified: Secondary | ICD-10-CM | POA: Diagnosis not present

## 2016-08-12 DIAGNOSIS — M170 Bilateral primary osteoarthritis of knee: Secondary | ICD-10-CM | POA: Diagnosis not present

## 2016-08-12 DIAGNOSIS — I10 Essential (primary) hypertension: Secondary | ICD-10-CM | POA: Diagnosis not present

## 2016-08-12 DIAGNOSIS — E11618 Type 2 diabetes mellitus with other diabetic arthropathy: Secondary | ICD-10-CM | POA: Diagnosis not present

## 2016-08-12 DIAGNOSIS — Z79891 Long term (current) use of opiate analgesic: Secondary | ICD-10-CM | POA: Diagnosis not present

## 2016-08-12 DIAGNOSIS — G300 Alzheimer's disease with early onset: Secondary | ICD-10-CM | POA: Diagnosis not present

## 2016-08-12 DIAGNOSIS — M25511 Pain in right shoulder: Secondary | ICD-10-CM | POA: Diagnosis not present

## 2016-08-12 DIAGNOSIS — Z7984 Long term (current) use of oral hypoglycemic drugs: Secondary | ICD-10-CM | POA: Diagnosis not present

## 2016-08-12 DIAGNOSIS — M6281 Muscle weakness (generalized): Secondary | ICD-10-CM | POA: Diagnosis not present

## 2016-08-12 DIAGNOSIS — Z741 Need for assistance with personal care: Secondary | ICD-10-CM | POA: Diagnosis not present

## 2016-08-12 DIAGNOSIS — R451 Restlessness and agitation: Secondary | ICD-10-CM | POA: Diagnosis not present

## 2016-08-13 DIAGNOSIS — E11618 Type 2 diabetes mellitus with other diabetic arthropathy: Secondary | ICD-10-CM | POA: Diagnosis not present

## 2016-08-13 DIAGNOSIS — Z741 Need for assistance with personal care: Secondary | ICD-10-CM | POA: Diagnosis not present

## 2016-08-13 DIAGNOSIS — M25511 Pain in right shoulder: Secondary | ICD-10-CM | POA: Diagnosis not present

## 2016-08-13 DIAGNOSIS — Z7984 Long term (current) use of oral hypoglycemic drugs: Secondary | ICD-10-CM | POA: Diagnosis not present

## 2016-08-13 DIAGNOSIS — Z79891 Long term (current) use of opiate analgesic: Secondary | ICD-10-CM | POA: Diagnosis not present

## 2016-08-13 DIAGNOSIS — L409 Psoriasis, unspecified: Secondary | ICD-10-CM | POA: Diagnosis not present

## 2016-08-13 DIAGNOSIS — M6281 Muscle weakness (generalized): Secondary | ICD-10-CM | POA: Diagnosis not present

## 2016-08-13 DIAGNOSIS — R451 Restlessness and agitation: Secondary | ICD-10-CM | POA: Diagnosis not present

## 2016-08-13 DIAGNOSIS — M170 Bilateral primary osteoarthritis of knee: Secondary | ICD-10-CM | POA: Diagnosis not present

## 2016-08-13 DIAGNOSIS — I10 Essential (primary) hypertension: Secondary | ICD-10-CM | POA: Diagnosis not present

## 2016-08-13 DIAGNOSIS — G300 Alzheimer's disease with early onset: Secondary | ICD-10-CM | POA: Diagnosis not present

## 2016-08-14 DIAGNOSIS — Z7984 Long term (current) use of oral hypoglycemic drugs: Secondary | ICD-10-CM | POA: Diagnosis not present

## 2016-08-14 DIAGNOSIS — G300 Alzheimer's disease with early onset: Secondary | ICD-10-CM | POA: Diagnosis not present

## 2016-08-14 DIAGNOSIS — E11618 Type 2 diabetes mellitus with other diabetic arthropathy: Secondary | ICD-10-CM | POA: Diagnosis not present

## 2016-08-14 DIAGNOSIS — Z741 Need for assistance with personal care: Secondary | ICD-10-CM | POA: Diagnosis not present

## 2016-08-14 DIAGNOSIS — R451 Restlessness and agitation: Secondary | ICD-10-CM | POA: Diagnosis not present

## 2016-08-14 DIAGNOSIS — L409 Psoriasis, unspecified: Secondary | ICD-10-CM | POA: Diagnosis not present

## 2016-08-14 DIAGNOSIS — M25511 Pain in right shoulder: Secondary | ICD-10-CM | POA: Diagnosis not present

## 2016-08-14 DIAGNOSIS — M6281 Muscle weakness (generalized): Secondary | ICD-10-CM | POA: Diagnosis not present

## 2016-08-14 DIAGNOSIS — Z79891 Long term (current) use of opiate analgesic: Secondary | ICD-10-CM | POA: Diagnosis not present

## 2016-08-14 DIAGNOSIS — M170 Bilateral primary osteoarthritis of knee: Secondary | ICD-10-CM | POA: Diagnosis not present

## 2016-08-14 DIAGNOSIS — I10 Essential (primary) hypertension: Secondary | ICD-10-CM | POA: Diagnosis not present

## 2016-08-19 DIAGNOSIS — Z7984 Long term (current) use of oral hypoglycemic drugs: Secondary | ICD-10-CM | POA: Diagnosis not present

## 2016-08-19 DIAGNOSIS — E11618 Type 2 diabetes mellitus with other diabetic arthropathy: Secondary | ICD-10-CM | POA: Diagnosis not present

## 2016-08-19 DIAGNOSIS — Z741 Need for assistance with personal care: Secondary | ICD-10-CM | POA: Diagnosis not present

## 2016-08-19 DIAGNOSIS — M25511 Pain in right shoulder: Secondary | ICD-10-CM | POA: Diagnosis not present

## 2016-08-19 DIAGNOSIS — M170 Bilateral primary osteoarthritis of knee: Secondary | ICD-10-CM | POA: Diagnosis not present

## 2016-08-19 DIAGNOSIS — G300 Alzheimer's disease with early onset: Secondary | ICD-10-CM | POA: Diagnosis not present

## 2016-08-19 DIAGNOSIS — R451 Restlessness and agitation: Secondary | ICD-10-CM | POA: Diagnosis not present

## 2016-08-19 DIAGNOSIS — Z79891 Long term (current) use of opiate analgesic: Secondary | ICD-10-CM | POA: Diagnosis not present

## 2016-08-19 DIAGNOSIS — L409 Psoriasis, unspecified: Secondary | ICD-10-CM | POA: Diagnosis not present

## 2016-08-19 DIAGNOSIS — M6281 Muscle weakness (generalized): Secondary | ICD-10-CM | POA: Diagnosis not present

## 2016-08-19 DIAGNOSIS — I10 Essential (primary) hypertension: Secondary | ICD-10-CM | POA: Diagnosis not present

## 2016-08-20 DIAGNOSIS — E11618 Type 2 diabetes mellitus with other diabetic arthropathy: Secondary | ICD-10-CM | POA: Diagnosis not present

## 2016-08-20 DIAGNOSIS — G300 Alzheimer's disease with early onset: Secondary | ICD-10-CM | POA: Diagnosis not present

## 2016-08-20 DIAGNOSIS — I10 Essential (primary) hypertension: Secondary | ICD-10-CM | POA: Diagnosis not present

## 2016-08-20 DIAGNOSIS — M6281 Muscle weakness (generalized): Secondary | ICD-10-CM | POA: Diagnosis not present

## 2016-08-20 DIAGNOSIS — Z741 Need for assistance with personal care: Secondary | ICD-10-CM | POA: Diagnosis not present

## 2016-08-20 DIAGNOSIS — Z7984 Long term (current) use of oral hypoglycemic drugs: Secondary | ICD-10-CM | POA: Diagnosis not present

## 2016-08-20 DIAGNOSIS — R451 Restlessness and agitation: Secondary | ICD-10-CM | POA: Diagnosis not present

## 2016-08-20 DIAGNOSIS — M170 Bilateral primary osteoarthritis of knee: Secondary | ICD-10-CM | POA: Diagnosis not present

## 2016-08-20 DIAGNOSIS — Z79891 Long term (current) use of opiate analgesic: Secondary | ICD-10-CM | POA: Diagnosis not present

## 2016-08-20 DIAGNOSIS — L409 Psoriasis, unspecified: Secondary | ICD-10-CM | POA: Diagnosis not present

## 2016-08-20 DIAGNOSIS — M25511 Pain in right shoulder: Secondary | ICD-10-CM | POA: Diagnosis not present

## 2016-08-21 DIAGNOSIS — Z741 Need for assistance with personal care: Secondary | ICD-10-CM | POA: Diagnosis not present

## 2016-08-21 DIAGNOSIS — G300 Alzheimer's disease with early onset: Secondary | ICD-10-CM | POA: Diagnosis not present

## 2016-08-21 DIAGNOSIS — M6281 Muscle weakness (generalized): Secondary | ICD-10-CM | POA: Diagnosis not present

## 2016-08-21 DIAGNOSIS — Z7984 Long term (current) use of oral hypoglycemic drugs: Secondary | ICD-10-CM | POA: Diagnosis not present

## 2016-08-21 DIAGNOSIS — I10 Essential (primary) hypertension: Secondary | ICD-10-CM | POA: Diagnosis not present

## 2016-08-21 DIAGNOSIS — R451 Restlessness and agitation: Secondary | ICD-10-CM | POA: Diagnosis not present

## 2016-08-21 DIAGNOSIS — M25511 Pain in right shoulder: Secondary | ICD-10-CM | POA: Diagnosis not present

## 2016-08-21 DIAGNOSIS — L409 Psoriasis, unspecified: Secondary | ICD-10-CM | POA: Diagnosis not present

## 2016-08-21 DIAGNOSIS — E11618 Type 2 diabetes mellitus with other diabetic arthropathy: Secondary | ICD-10-CM | POA: Diagnosis not present

## 2016-08-21 DIAGNOSIS — Z79891 Long term (current) use of opiate analgesic: Secondary | ICD-10-CM | POA: Diagnosis not present

## 2016-08-21 DIAGNOSIS — M170 Bilateral primary osteoarthritis of knee: Secondary | ICD-10-CM | POA: Diagnosis not present

## 2016-08-25 DIAGNOSIS — Z79891 Long term (current) use of opiate analgesic: Secondary | ICD-10-CM | POA: Diagnosis not present

## 2016-08-25 DIAGNOSIS — I10 Essential (primary) hypertension: Secondary | ICD-10-CM | POA: Diagnosis not present

## 2016-08-25 DIAGNOSIS — Z7984 Long term (current) use of oral hypoglycemic drugs: Secondary | ICD-10-CM | POA: Diagnosis not present

## 2016-08-25 DIAGNOSIS — Z741 Need for assistance with personal care: Secondary | ICD-10-CM | POA: Diagnosis not present

## 2016-08-25 DIAGNOSIS — M6281 Muscle weakness (generalized): Secondary | ICD-10-CM | POA: Diagnosis not present

## 2016-08-25 DIAGNOSIS — E11618 Type 2 diabetes mellitus with other diabetic arthropathy: Secondary | ICD-10-CM | POA: Diagnosis not present

## 2016-08-25 DIAGNOSIS — M170 Bilateral primary osteoarthritis of knee: Secondary | ICD-10-CM | POA: Diagnosis not present

## 2016-08-25 DIAGNOSIS — R451 Restlessness and agitation: Secondary | ICD-10-CM | POA: Diagnosis not present

## 2016-08-25 DIAGNOSIS — G300 Alzheimer's disease with early onset: Secondary | ICD-10-CM | POA: Diagnosis not present

## 2016-08-25 DIAGNOSIS — M25511 Pain in right shoulder: Secondary | ICD-10-CM | POA: Diagnosis not present

## 2016-08-25 DIAGNOSIS — L409 Psoriasis, unspecified: Secondary | ICD-10-CM | POA: Diagnosis not present

## 2016-08-29 DIAGNOSIS — Z741 Need for assistance with personal care: Secondary | ICD-10-CM | POA: Diagnosis not present

## 2016-08-29 DIAGNOSIS — I10 Essential (primary) hypertension: Secondary | ICD-10-CM | POA: Diagnosis not present

## 2016-08-29 DIAGNOSIS — L409 Psoriasis, unspecified: Secondary | ICD-10-CM | POA: Diagnosis not present

## 2016-08-29 DIAGNOSIS — Z7984 Long term (current) use of oral hypoglycemic drugs: Secondary | ICD-10-CM | POA: Diagnosis not present

## 2016-08-29 DIAGNOSIS — M25511 Pain in right shoulder: Secondary | ICD-10-CM | POA: Diagnosis not present

## 2016-08-29 DIAGNOSIS — M6281 Muscle weakness (generalized): Secondary | ICD-10-CM | POA: Diagnosis not present

## 2016-08-29 DIAGNOSIS — E11618 Type 2 diabetes mellitus with other diabetic arthropathy: Secondary | ICD-10-CM | POA: Diagnosis not present

## 2016-08-29 DIAGNOSIS — Z79891 Long term (current) use of opiate analgesic: Secondary | ICD-10-CM | POA: Diagnosis not present

## 2016-08-29 DIAGNOSIS — G300 Alzheimer's disease with early onset: Secondary | ICD-10-CM | POA: Diagnosis not present

## 2016-08-29 DIAGNOSIS — R451 Restlessness and agitation: Secondary | ICD-10-CM | POA: Diagnosis not present

## 2016-08-29 DIAGNOSIS — M170 Bilateral primary osteoarthritis of knee: Secondary | ICD-10-CM | POA: Diagnosis not present

## 2016-09-02 DIAGNOSIS — Z79891 Long term (current) use of opiate analgesic: Secondary | ICD-10-CM | POA: Diagnosis not present

## 2016-09-02 DIAGNOSIS — E11618 Type 2 diabetes mellitus with other diabetic arthropathy: Secondary | ICD-10-CM | POA: Diagnosis not present

## 2016-09-02 DIAGNOSIS — M25511 Pain in right shoulder: Secondary | ICD-10-CM | POA: Diagnosis not present

## 2016-09-02 DIAGNOSIS — L409 Psoriasis, unspecified: Secondary | ICD-10-CM | POA: Diagnosis not present

## 2016-09-02 DIAGNOSIS — Z741 Need for assistance with personal care: Secondary | ICD-10-CM | POA: Diagnosis not present

## 2016-09-02 DIAGNOSIS — M6281 Muscle weakness (generalized): Secondary | ICD-10-CM | POA: Diagnosis not present

## 2016-09-02 DIAGNOSIS — I10 Essential (primary) hypertension: Secondary | ICD-10-CM | POA: Diagnosis not present

## 2016-09-02 DIAGNOSIS — M170 Bilateral primary osteoarthritis of knee: Secondary | ICD-10-CM | POA: Diagnosis not present

## 2016-09-02 DIAGNOSIS — Z7984 Long term (current) use of oral hypoglycemic drugs: Secondary | ICD-10-CM | POA: Diagnosis not present

## 2016-09-02 DIAGNOSIS — G300 Alzheimer's disease with early onset: Secondary | ICD-10-CM | POA: Diagnosis not present

## 2016-09-02 DIAGNOSIS — R451 Restlessness and agitation: Secondary | ICD-10-CM | POA: Diagnosis not present

## 2016-09-04 DIAGNOSIS — M6281 Muscle weakness (generalized): Secondary | ICD-10-CM | POA: Diagnosis not present

## 2016-09-04 DIAGNOSIS — Z741 Need for assistance with personal care: Secondary | ICD-10-CM | POA: Diagnosis not present

## 2016-09-04 DIAGNOSIS — Z7984 Long term (current) use of oral hypoglycemic drugs: Secondary | ICD-10-CM | POA: Diagnosis not present

## 2016-09-04 DIAGNOSIS — G300 Alzheimer's disease with early onset: Secondary | ICD-10-CM | POA: Diagnosis not present

## 2016-09-04 DIAGNOSIS — M170 Bilateral primary osteoarthritis of knee: Secondary | ICD-10-CM | POA: Diagnosis not present

## 2016-09-04 DIAGNOSIS — E11618 Type 2 diabetes mellitus with other diabetic arthropathy: Secondary | ICD-10-CM | POA: Diagnosis not present

## 2016-09-04 DIAGNOSIS — L409 Psoriasis, unspecified: Secondary | ICD-10-CM | POA: Diagnosis not present

## 2016-09-04 DIAGNOSIS — Z79891 Long term (current) use of opiate analgesic: Secondary | ICD-10-CM | POA: Diagnosis not present

## 2016-09-04 DIAGNOSIS — I10 Essential (primary) hypertension: Secondary | ICD-10-CM | POA: Diagnosis not present

## 2016-09-04 DIAGNOSIS — R451 Restlessness and agitation: Secondary | ICD-10-CM | POA: Diagnosis not present

## 2016-09-04 DIAGNOSIS — M25511 Pain in right shoulder: Secondary | ICD-10-CM | POA: Diagnosis not present

## 2016-09-09 DIAGNOSIS — G300 Alzheimer's disease with early onset: Secondary | ICD-10-CM | POA: Diagnosis not present

## 2016-09-09 DIAGNOSIS — M6281 Muscle weakness (generalized): Secondary | ICD-10-CM | POA: Diagnosis not present

## 2016-09-09 DIAGNOSIS — L409 Psoriasis, unspecified: Secondary | ICD-10-CM | POA: Diagnosis not present

## 2016-09-09 DIAGNOSIS — M25511 Pain in right shoulder: Secondary | ICD-10-CM | POA: Diagnosis not present

## 2016-09-09 DIAGNOSIS — M170 Bilateral primary osteoarthritis of knee: Secondary | ICD-10-CM | POA: Diagnosis not present

## 2016-09-09 DIAGNOSIS — Z741 Need for assistance with personal care: Secondary | ICD-10-CM | POA: Diagnosis not present

## 2016-09-09 DIAGNOSIS — I10 Essential (primary) hypertension: Secondary | ICD-10-CM | POA: Diagnosis not present

## 2016-09-09 DIAGNOSIS — Z79891 Long term (current) use of opiate analgesic: Secondary | ICD-10-CM | POA: Diagnosis not present

## 2016-09-09 DIAGNOSIS — Z7984 Long term (current) use of oral hypoglycemic drugs: Secondary | ICD-10-CM | POA: Diagnosis not present

## 2016-09-09 DIAGNOSIS — R451 Restlessness and agitation: Secondary | ICD-10-CM | POA: Diagnosis not present

## 2016-09-09 DIAGNOSIS — E11618 Type 2 diabetes mellitus with other diabetic arthropathy: Secondary | ICD-10-CM | POA: Diagnosis not present

## 2016-09-11 DIAGNOSIS — I10 Essential (primary) hypertension: Secondary | ICD-10-CM | POA: Diagnosis not present

## 2016-09-11 DIAGNOSIS — Z741 Need for assistance with personal care: Secondary | ICD-10-CM | POA: Diagnosis not present

## 2016-09-11 DIAGNOSIS — M170 Bilateral primary osteoarthritis of knee: Secondary | ICD-10-CM | POA: Diagnosis not present

## 2016-09-11 DIAGNOSIS — G300 Alzheimer's disease with early onset: Secondary | ICD-10-CM | POA: Diagnosis not present

## 2016-09-11 DIAGNOSIS — R451 Restlessness and agitation: Secondary | ICD-10-CM | POA: Diagnosis not present

## 2016-09-11 DIAGNOSIS — Z79891 Long term (current) use of opiate analgesic: Secondary | ICD-10-CM | POA: Diagnosis not present

## 2016-09-11 DIAGNOSIS — E11618 Type 2 diabetes mellitus with other diabetic arthropathy: Secondary | ICD-10-CM | POA: Diagnosis not present

## 2016-09-11 DIAGNOSIS — Z7984 Long term (current) use of oral hypoglycemic drugs: Secondary | ICD-10-CM | POA: Diagnosis not present

## 2016-09-11 DIAGNOSIS — L409 Psoriasis, unspecified: Secondary | ICD-10-CM | POA: Diagnosis not present

## 2016-09-11 DIAGNOSIS — M6281 Muscle weakness (generalized): Secondary | ICD-10-CM | POA: Diagnosis not present

## 2016-09-11 DIAGNOSIS — M25511 Pain in right shoulder: Secondary | ICD-10-CM | POA: Diagnosis not present

## 2016-09-16 DIAGNOSIS — Z741 Need for assistance with personal care: Secondary | ICD-10-CM | POA: Diagnosis not present

## 2016-09-16 DIAGNOSIS — Z79891 Long term (current) use of opiate analgesic: Secondary | ICD-10-CM | POA: Diagnosis not present

## 2016-09-16 DIAGNOSIS — M170 Bilateral primary osteoarthritis of knee: Secondary | ICD-10-CM | POA: Diagnosis not present

## 2016-09-16 DIAGNOSIS — I10 Essential (primary) hypertension: Secondary | ICD-10-CM | POA: Diagnosis not present

## 2016-09-16 DIAGNOSIS — Z7984 Long term (current) use of oral hypoglycemic drugs: Secondary | ICD-10-CM | POA: Diagnosis not present

## 2016-09-16 DIAGNOSIS — G300 Alzheimer's disease with early onset: Secondary | ICD-10-CM | POA: Diagnosis not present

## 2016-09-16 DIAGNOSIS — M25511 Pain in right shoulder: Secondary | ICD-10-CM | POA: Diagnosis not present

## 2016-09-16 DIAGNOSIS — R451 Restlessness and agitation: Secondary | ICD-10-CM | POA: Diagnosis not present

## 2016-09-16 DIAGNOSIS — L409 Psoriasis, unspecified: Secondary | ICD-10-CM | POA: Diagnosis not present

## 2016-09-16 DIAGNOSIS — E11618 Type 2 diabetes mellitus with other diabetic arthropathy: Secondary | ICD-10-CM | POA: Diagnosis not present

## 2016-09-16 DIAGNOSIS — M6281 Muscle weakness (generalized): Secondary | ICD-10-CM | POA: Diagnosis not present

## 2016-09-17 DIAGNOSIS — Z79891 Long term (current) use of opiate analgesic: Secondary | ICD-10-CM | POA: Diagnosis not present

## 2016-09-17 DIAGNOSIS — Z7984 Long term (current) use of oral hypoglycemic drugs: Secondary | ICD-10-CM | POA: Diagnosis not present

## 2016-09-17 DIAGNOSIS — M25511 Pain in right shoulder: Secondary | ICD-10-CM | POA: Diagnosis not present

## 2016-09-17 DIAGNOSIS — R451 Restlessness and agitation: Secondary | ICD-10-CM | POA: Diagnosis not present

## 2016-09-17 DIAGNOSIS — M6281 Muscle weakness (generalized): Secondary | ICD-10-CM | POA: Diagnosis not present

## 2016-09-17 DIAGNOSIS — I10 Essential (primary) hypertension: Secondary | ICD-10-CM | POA: Diagnosis not present

## 2016-09-17 DIAGNOSIS — G300 Alzheimer's disease with early onset: Secondary | ICD-10-CM | POA: Diagnosis not present

## 2016-09-17 DIAGNOSIS — E11618 Type 2 diabetes mellitus with other diabetic arthropathy: Secondary | ICD-10-CM | POA: Diagnosis not present

## 2016-09-17 DIAGNOSIS — L409 Psoriasis, unspecified: Secondary | ICD-10-CM | POA: Diagnosis not present

## 2016-09-17 DIAGNOSIS — M170 Bilateral primary osteoarthritis of knee: Secondary | ICD-10-CM | POA: Diagnosis not present

## 2016-09-17 DIAGNOSIS — Z741 Need for assistance with personal care: Secondary | ICD-10-CM | POA: Diagnosis not present

## 2016-09-18 DIAGNOSIS — M170 Bilateral primary osteoarthritis of knee: Secondary | ICD-10-CM | POA: Diagnosis not present

## 2016-09-18 DIAGNOSIS — Z79891 Long term (current) use of opiate analgesic: Secondary | ICD-10-CM | POA: Diagnosis not present

## 2016-09-18 DIAGNOSIS — M6281 Muscle weakness (generalized): Secondary | ICD-10-CM | POA: Diagnosis not present

## 2016-09-18 DIAGNOSIS — R451 Restlessness and agitation: Secondary | ICD-10-CM | POA: Diagnosis not present

## 2016-09-18 DIAGNOSIS — Z741 Need for assistance with personal care: Secondary | ICD-10-CM | POA: Diagnosis not present

## 2016-09-18 DIAGNOSIS — Z7984 Long term (current) use of oral hypoglycemic drugs: Secondary | ICD-10-CM | POA: Diagnosis not present

## 2016-09-18 DIAGNOSIS — G300 Alzheimer's disease with early onset: Secondary | ICD-10-CM | POA: Diagnosis not present

## 2016-09-18 DIAGNOSIS — E11618 Type 2 diabetes mellitus with other diabetic arthropathy: Secondary | ICD-10-CM | POA: Diagnosis not present

## 2016-09-18 DIAGNOSIS — L409 Psoriasis, unspecified: Secondary | ICD-10-CM | POA: Diagnosis not present

## 2016-09-18 DIAGNOSIS — M25511 Pain in right shoulder: Secondary | ICD-10-CM | POA: Diagnosis not present

## 2016-09-18 DIAGNOSIS — I10 Essential (primary) hypertension: Secondary | ICD-10-CM | POA: Diagnosis not present

## 2016-09-22 DIAGNOSIS — L409 Psoriasis, unspecified: Secondary | ICD-10-CM | POA: Diagnosis not present

## 2016-09-22 DIAGNOSIS — Z79891 Long term (current) use of opiate analgesic: Secondary | ICD-10-CM | POA: Diagnosis not present

## 2016-09-22 DIAGNOSIS — G300 Alzheimer's disease with early onset: Secondary | ICD-10-CM | POA: Diagnosis not present

## 2016-09-22 DIAGNOSIS — Z741 Need for assistance with personal care: Secondary | ICD-10-CM | POA: Diagnosis not present

## 2016-09-22 DIAGNOSIS — I10 Essential (primary) hypertension: Secondary | ICD-10-CM | POA: Diagnosis not present

## 2016-09-22 DIAGNOSIS — R451 Restlessness and agitation: Secondary | ICD-10-CM | POA: Diagnosis not present

## 2016-09-22 DIAGNOSIS — Z7984 Long term (current) use of oral hypoglycemic drugs: Secondary | ICD-10-CM | POA: Diagnosis not present

## 2016-09-22 DIAGNOSIS — M6281 Muscle weakness (generalized): Secondary | ICD-10-CM | POA: Diagnosis not present

## 2016-09-22 DIAGNOSIS — M25511 Pain in right shoulder: Secondary | ICD-10-CM | POA: Diagnosis not present

## 2016-09-22 DIAGNOSIS — E11618 Type 2 diabetes mellitus with other diabetic arthropathy: Secondary | ICD-10-CM | POA: Diagnosis not present

## 2016-09-22 DIAGNOSIS — M170 Bilateral primary osteoarthritis of knee: Secondary | ICD-10-CM | POA: Diagnosis not present

## 2016-09-23 DIAGNOSIS — Z7984 Long term (current) use of oral hypoglycemic drugs: Secondary | ICD-10-CM | POA: Diagnosis not present

## 2016-09-23 DIAGNOSIS — E11618 Type 2 diabetes mellitus with other diabetic arthropathy: Secondary | ICD-10-CM | POA: Diagnosis not present

## 2016-09-23 DIAGNOSIS — L409 Psoriasis, unspecified: Secondary | ICD-10-CM | POA: Diagnosis not present

## 2016-09-23 DIAGNOSIS — Z79891 Long term (current) use of opiate analgesic: Secondary | ICD-10-CM | POA: Diagnosis not present

## 2016-09-23 DIAGNOSIS — M6281 Muscle weakness (generalized): Secondary | ICD-10-CM | POA: Diagnosis not present

## 2016-09-23 DIAGNOSIS — M170 Bilateral primary osteoarthritis of knee: Secondary | ICD-10-CM | POA: Diagnosis not present

## 2016-09-23 DIAGNOSIS — G300 Alzheimer's disease with early onset: Secondary | ICD-10-CM | POA: Diagnosis not present

## 2016-09-23 DIAGNOSIS — I10 Essential (primary) hypertension: Secondary | ICD-10-CM | POA: Diagnosis not present

## 2016-09-23 DIAGNOSIS — Z741 Need for assistance with personal care: Secondary | ICD-10-CM | POA: Diagnosis not present

## 2016-09-23 DIAGNOSIS — R451 Restlessness and agitation: Secondary | ICD-10-CM | POA: Diagnosis not present

## 2016-09-23 DIAGNOSIS — M25511 Pain in right shoulder: Secondary | ICD-10-CM | POA: Diagnosis not present

## 2016-10-22 DIAGNOSIS — R451 Restlessness and agitation: Secondary | ICD-10-CM | POA: Diagnosis not present

## 2016-11-12 DIAGNOSIS — W1789XA Other fall from one level to another, initial encounter: Secondary | ICD-10-CM | POA: Diagnosis not present

## 2016-11-21 DIAGNOSIS — M1990 Unspecified osteoarthritis, unspecified site: Secondary | ICD-10-CM | POA: Diagnosis not present

## 2016-11-21 DIAGNOSIS — L409 Psoriasis, unspecified: Secondary | ICD-10-CM | POA: Diagnosis not present

## 2016-11-21 DIAGNOSIS — R451 Restlessness and agitation: Secondary | ICD-10-CM | POA: Diagnosis not present

## 2016-11-21 DIAGNOSIS — G300 Alzheimer's disease with early onset: Secondary | ICD-10-CM | POA: Diagnosis not present

## 2016-11-21 DIAGNOSIS — Z741 Need for assistance with personal care: Secondary | ICD-10-CM | POA: Diagnosis not present

## 2016-11-21 DIAGNOSIS — E119 Type 2 diabetes mellitus without complications: Secondary | ICD-10-CM | POA: Diagnosis not present

## 2016-11-21 DIAGNOSIS — Z7984 Long term (current) use of oral hypoglycemic drugs: Secondary | ICD-10-CM | POA: Diagnosis not present

## 2016-11-21 DIAGNOSIS — Z79891 Long term (current) use of opiate analgesic: Secondary | ICD-10-CM | POA: Diagnosis not present

## 2016-11-24 DIAGNOSIS — R451 Restlessness and agitation: Secondary | ICD-10-CM | POA: Diagnosis not present

## 2016-11-24 DIAGNOSIS — M1990 Unspecified osteoarthritis, unspecified site: Secondary | ICD-10-CM | POA: Diagnosis not present

## 2016-11-24 DIAGNOSIS — E119 Type 2 diabetes mellitus without complications: Secondary | ICD-10-CM | POA: Diagnosis not present

## 2016-11-24 DIAGNOSIS — Z79891 Long term (current) use of opiate analgesic: Secondary | ICD-10-CM | POA: Diagnosis not present

## 2016-11-24 DIAGNOSIS — L409 Psoriasis, unspecified: Secondary | ICD-10-CM | POA: Diagnosis not present

## 2016-11-24 DIAGNOSIS — Z741 Need for assistance with personal care: Secondary | ICD-10-CM | POA: Diagnosis not present

## 2016-11-24 DIAGNOSIS — Z7984 Long term (current) use of oral hypoglycemic drugs: Secondary | ICD-10-CM | POA: Diagnosis not present

## 2016-11-24 DIAGNOSIS — G300 Alzheimer's disease with early onset: Secondary | ICD-10-CM | POA: Diagnosis not present

## 2016-11-29 DIAGNOSIS — M1990 Unspecified osteoarthritis, unspecified site: Secondary | ICD-10-CM | POA: Diagnosis not present

## 2016-11-29 DIAGNOSIS — L409 Psoriasis, unspecified: Secondary | ICD-10-CM | POA: Diagnosis not present

## 2016-11-29 DIAGNOSIS — E119 Type 2 diabetes mellitus without complications: Secondary | ICD-10-CM | POA: Diagnosis not present

## 2016-11-29 DIAGNOSIS — R451 Restlessness and agitation: Secondary | ICD-10-CM | POA: Diagnosis not present

## 2016-11-29 DIAGNOSIS — Z79891 Long term (current) use of opiate analgesic: Secondary | ICD-10-CM | POA: Diagnosis not present

## 2016-11-29 DIAGNOSIS — Z7984 Long term (current) use of oral hypoglycemic drugs: Secondary | ICD-10-CM | POA: Diagnosis not present

## 2016-11-29 DIAGNOSIS — G300 Alzheimer's disease with early onset: Secondary | ICD-10-CM | POA: Diagnosis not present

## 2016-11-29 DIAGNOSIS — Z741 Need for assistance with personal care: Secondary | ICD-10-CM | POA: Diagnosis not present

## 2016-12-13 DIAGNOSIS — J449 Chronic obstructive pulmonary disease, unspecified: Secondary | ICD-10-CM | POA: Diagnosis not present

## 2016-12-13 DIAGNOSIS — E872 Acidosis: Secondary | ICD-10-CM | POA: Diagnosis not present

## 2016-12-13 DIAGNOSIS — G301 Alzheimer's disease with late onset: Secondary | ICD-10-CM | POA: Diagnosis not present

## 2016-12-13 DIAGNOSIS — R51 Headache: Secondary | ICD-10-CM | POA: Diagnosis not present

## 2016-12-13 DIAGNOSIS — R410 Disorientation, unspecified: Secondary | ICD-10-CM | POA: Diagnosis not present

## 2016-12-13 DIAGNOSIS — S299XXA Unspecified injury of thorax, initial encounter: Secondary | ICD-10-CM | POA: Diagnosis not present

## 2016-12-13 DIAGNOSIS — Z79899 Other long term (current) drug therapy: Secondary | ICD-10-CM | POA: Diagnosis not present

## 2016-12-13 DIAGNOSIS — N3001 Acute cystitis with hematuria: Secondary | ICD-10-CM | POA: Diagnosis not present

## 2016-12-13 DIAGNOSIS — R5381 Other malaise: Secondary | ICD-10-CM | POA: Diagnosis not present

## 2016-12-13 DIAGNOSIS — K219 Gastro-esophageal reflux disease without esophagitis: Secondary | ICD-10-CM | POA: Diagnosis not present

## 2016-12-13 DIAGNOSIS — S0990XA Unspecified injury of head, initial encounter: Secondary | ICD-10-CM | POA: Diagnosis not present

## 2016-12-13 DIAGNOSIS — R29898 Other symptoms and signs involving the musculoskeletal system: Secondary | ICD-10-CM | POA: Diagnosis not present

## 2016-12-13 DIAGNOSIS — N3000 Acute cystitis without hematuria: Secondary | ICD-10-CM | POA: Diagnosis not present

## 2016-12-13 DIAGNOSIS — R296 Repeated falls: Secondary | ICD-10-CM | POA: Diagnosis not present

## 2016-12-13 DIAGNOSIS — Z885 Allergy status to narcotic agent status: Secondary | ICD-10-CM | POA: Diagnosis not present

## 2016-12-13 DIAGNOSIS — R531 Weakness: Secondary | ICD-10-CM | POA: Diagnosis not present

## 2016-12-13 DIAGNOSIS — Z87891 Personal history of nicotine dependence: Secondary | ICD-10-CM | POA: Diagnosis not present

## 2016-12-13 DIAGNOSIS — Z23 Encounter for immunization: Secondary | ICD-10-CM | POA: Diagnosis not present

## 2016-12-13 DIAGNOSIS — E119 Type 2 diabetes mellitus without complications: Secondary | ICD-10-CM | POA: Diagnosis not present

## 2016-12-13 DIAGNOSIS — I10 Essential (primary) hypertension: Secondary | ICD-10-CM | POA: Diagnosis not present

## 2016-12-14 DIAGNOSIS — I10 Essential (primary) hypertension: Secondary | ICD-10-CM | POA: Diagnosis not present

## 2016-12-14 DIAGNOSIS — E119 Type 2 diabetes mellitus without complications: Secondary | ICD-10-CM | POA: Diagnosis not present

## 2016-12-14 DIAGNOSIS — R29898 Other symptoms and signs involving the musculoskeletal system: Secondary | ICD-10-CM | POA: Diagnosis not present

## 2016-12-14 DIAGNOSIS — Z79899 Other long term (current) drug therapy: Secondary | ICD-10-CM | POA: Diagnosis not present

## 2016-12-14 DIAGNOSIS — Z87891 Personal history of nicotine dependence: Secondary | ICD-10-CM | POA: Diagnosis not present

## 2016-12-14 DIAGNOSIS — Z23 Encounter for immunization: Secondary | ICD-10-CM | POA: Diagnosis not present

## 2016-12-14 DIAGNOSIS — E872 Acidosis: Secondary | ICD-10-CM | POA: Diagnosis not present

## 2016-12-14 DIAGNOSIS — J449 Chronic obstructive pulmonary disease, unspecified: Secondary | ICD-10-CM | POA: Diagnosis not present

## 2016-12-14 DIAGNOSIS — G301 Alzheimer's disease with late onset: Secondary | ICD-10-CM | POA: Diagnosis not present

## 2016-12-14 DIAGNOSIS — N3001 Acute cystitis with hematuria: Secondary | ICD-10-CM | POA: Diagnosis not present

## 2016-12-14 DIAGNOSIS — K219 Gastro-esophageal reflux disease without esophagitis: Secondary | ICD-10-CM | POA: Diagnosis not present

## 2016-12-14 DIAGNOSIS — N3000 Acute cystitis without hematuria: Secondary | ICD-10-CM | POA: Diagnosis not present

## 2016-12-14 DIAGNOSIS — Z885 Allergy status to narcotic agent status: Secondary | ICD-10-CM | POA: Diagnosis not present

## 2016-12-15 DIAGNOSIS — R29898 Other symptoms and signs involving the musculoskeletal system: Secondary | ICD-10-CM | POA: Diagnosis not present

## 2016-12-15 DIAGNOSIS — K219 Gastro-esophageal reflux disease without esophagitis: Secondary | ICD-10-CM | POA: Diagnosis not present

## 2016-12-15 DIAGNOSIS — G301 Alzheimer's disease with late onset: Secondary | ICD-10-CM | POA: Diagnosis not present

## 2016-12-15 DIAGNOSIS — W19XXXA Unspecified fall, initial encounter: Secondary | ICD-10-CM | POA: Diagnosis not present

## 2016-12-15 DIAGNOSIS — E872 Acidosis: Secondary | ICD-10-CM | POA: Diagnosis not present

## 2016-12-15 DIAGNOSIS — Z79899 Other long term (current) drug therapy: Secondary | ICD-10-CM | POA: Diagnosis not present

## 2016-12-15 DIAGNOSIS — Z885 Allergy status to narcotic agent status: Secondary | ICD-10-CM | POA: Diagnosis not present

## 2016-12-15 DIAGNOSIS — Z23 Encounter for immunization: Secondary | ICD-10-CM | POA: Diagnosis not present

## 2016-12-15 DIAGNOSIS — I10 Essential (primary) hypertension: Secondary | ICD-10-CM | POA: Diagnosis not present

## 2016-12-15 DIAGNOSIS — J449 Chronic obstructive pulmonary disease, unspecified: Secondary | ICD-10-CM | POA: Diagnosis not present

## 2016-12-15 DIAGNOSIS — Z87891 Personal history of nicotine dependence: Secondary | ICD-10-CM | POA: Diagnosis not present

## 2016-12-15 DIAGNOSIS — N3001 Acute cystitis with hematuria: Secondary | ICD-10-CM | POA: Diagnosis not present

## 2016-12-15 DIAGNOSIS — R5381 Other malaise: Secondary | ICD-10-CM | POA: Diagnosis not present

## 2016-12-15 DIAGNOSIS — E119 Type 2 diabetes mellitus without complications: Secondary | ICD-10-CM | POA: Diagnosis not present

## 2016-12-16 DIAGNOSIS — E119 Type 2 diabetes mellitus without complications: Secondary | ICD-10-CM | POA: Diagnosis not present

## 2016-12-16 DIAGNOSIS — R29898 Other symptoms and signs involving the musculoskeletal system: Secondary | ICD-10-CM | POA: Diagnosis not present

## 2016-12-16 DIAGNOSIS — Z79899 Other long term (current) drug therapy: Secondary | ICD-10-CM | POA: Diagnosis not present

## 2016-12-16 DIAGNOSIS — K219 Gastro-esophageal reflux disease without esophagitis: Secondary | ICD-10-CM | POA: Diagnosis not present

## 2016-12-16 DIAGNOSIS — E872 Acidosis: Secondary | ICD-10-CM | POA: Diagnosis not present

## 2016-12-16 DIAGNOSIS — G301 Alzheimer's disease with late onset: Secondary | ICD-10-CM | POA: Diagnosis not present

## 2016-12-16 DIAGNOSIS — Z23 Encounter for immunization: Secondary | ICD-10-CM | POA: Diagnosis not present

## 2016-12-16 DIAGNOSIS — N3001 Acute cystitis with hematuria: Secondary | ICD-10-CM | POA: Diagnosis not present

## 2016-12-16 DIAGNOSIS — R5381 Other malaise: Secondary | ICD-10-CM | POA: Diagnosis not present

## 2016-12-16 DIAGNOSIS — N3000 Acute cystitis without hematuria: Secondary | ICD-10-CM | POA: Diagnosis not present

## 2016-12-16 DIAGNOSIS — Z87891 Personal history of nicotine dependence: Secondary | ICD-10-CM | POA: Diagnosis not present

## 2016-12-16 DIAGNOSIS — Z885 Allergy status to narcotic agent status: Secondary | ICD-10-CM | POA: Diagnosis not present

## 2016-12-16 DIAGNOSIS — I10 Essential (primary) hypertension: Secondary | ICD-10-CM | POA: Diagnosis not present

## 2016-12-16 DIAGNOSIS — J449 Chronic obstructive pulmonary disease, unspecified: Secondary | ICD-10-CM | POA: Diagnosis not present

## 2016-12-17 DIAGNOSIS — R2689 Other abnormalities of gait and mobility: Secondary | ICD-10-CM | POA: Diagnosis not present

## 2016-12-17 DIAGNOSIS — N3001 Acute cystitis with hematuria: Secondary | ICD-10-CM | POA: Diagnosis not present

## 2016-12-17 DIAGNOSIS — I10 Essential (primary) hypertension: Secondary | ICD-10-CM | POA: Diagnosis not present

## 2016-12-17 DIAGNOSIS — G309 Alzheimer's disease, unspecified: Secondary | ICD-10-CM | POA: Diagnosis not present

## 2016-12-17 DIAGNOSIS — K219 Gastro-esophageal reflux disease without esophagitis: Secondary | ICD-10-CM | POA: Diagnosis not present

## 2016-12-17 DIAGNOSIS — R5381 Other malaise: Secondary | ICD-10-CM | POA: Diagnosis not present

## 2016-12-17 DIAGNOSIS — G301 Alzheimer's disease with late onset: Secondary | ICD-10-CM | POA: Diagnosis not present

## 2016-12-17 DIAGNOSIS — R29898 Other symptoms and signs involving the musculoskeletal system: Secondary | ICD-10-CM | POA: Diagnosis not present

## 2016-12-17 DIAGNOSIS — Z79899 Other long term (current) drug therapy: Secondary | ICD-10-CM | POA: Diagnosis not present

## 2016-12-17 DIAGNOSIS — Z23 Encounter for immunization: Secondary | ICD-10-CM | POA: Diagnosis not present

## 2016-12-17 DIAGNOSIS — N3000 Acute cystitis without hematuria: Secondary | ICD-10-CM | POA: Diagnosis not present

## 2016-12-17 DIAGNOSIS — R1311 Dysphagia, oral phase: Secondary | ICD-10-CM | POA: Diagnosis not present

## 2016-12-17 DIAGNOSIS — E785 Hyperlipidemia, unspecified: Secondary | ICD-10-CM | POA: Diagnosis not present

## 2016-12-17 DIAGNOSIS — E119 Type 2 diabetes mellitus without complications: Secondary | ICD-10-CM | POA: Diagnosis not present

## 2016-12-17 DIAGNOSIS — Z87891 Personal history of nicotine dependence: Secondary | ICD-10-CM | POA: Diagnosis not present

## 2016-12-17 DIAGNOSIS — E872 Acidosis: Secondary | ICD-10-CM | POA: Diagnosis not present

## 2016-12-17 DIAGNOSIS — Z885 Allergy status to narcotic agent status: Secondary | ICD-10-CM | POA: Diagnosis not present

## 2016-12-17 DIAGNOSIS — R259 Unspecified abnormal involuntary movements: Secondary | ICD-10-CM | POA: Diagnosis not present

## 2016-12-17 DIAGNOSIS — R278 Other lack of coordination: Secondary | ICD-10-CM | POA: Diagnosis not present

## 2016-12-17 DIAGNOSIS — N390 Urinary tract infection, site not specified: Secondary | ICD-10-CM | POA: Diagnosis not present

## 2016-12-17 DIAGNOSIS — J449 Chronic obstructive pulmonary disease, unspecified: Secondary | ICD-10-CM | POA: Diagnosis not present

## 2016-12-19 DIAGNOSIS — I10 Essential (primary) hypertension: Secondary | ICD-10-CM | POA: Diagnosis not present

## 2016-12-19 DIAGNOSIS — E119 Type 2 diabetes mellitus without complications: Secondary | ICD-10-CM | POA: Diagnosis not present

## 2016-12-19 DIAGNOSIS — N390 Urinary tract infection, site not specified: Secondary | ICD-10-CM | POA: Diagnosis not present

## 2016-12-19 DIAGNOSIS — G309 Alzheimer's disease, unspecified: Secondary | ICD-10-CM | POA: Diagnosis not present

## 2016-12-21 DIAGNOSIS — I10 Essential (primary) hypertension: Secondary | ICD-10-CM | POA: Diagnosis not present

## 2016-12-21 DIAGNOSIS — J449 Chronic obstructive pulmonary disease, unspecified: Secondary | ICD-10-CM | POA: Diagnosis not present

## 2016-12-21 DIAGNOSIS — N390 Urinary tract infection, site not specified: Secondary | ICD-10-CM | POA: Diagnosis not present

## 2016-12-24 DIAGNOSIS — I10 Essential (primary) hypertension: Secondary | ICD-10-CM | POA: Diagnosis not present

## 2016-12-24 DIAGNOSIS — N390 Urinary tract infection, site not specified: Secondary | ICD-10-CM | POA: Diagnosis not present

## 2016-12-24 DIAGNOSIS — G309 Alzheimer's disease, unspecified: Secondary | ICD-10-CM | POA: Diagnosis not present

## 2016-12-24 DIAGNOSIS — E119 Type 2 diabetes mellitus without complications: Secondary | ICD-10-CM | POA: Diagnosis not present

## 2017-01-02 DIAGNOSIS — I10 Essential (primary) hypertension: Secondary | ICD-10-CM | POA: Diagnosis not present

## 2017-01-02 DIAGNOSIS — G309 Alzheimer's disease, unspecified: Secondary | ICD-10-CM | POA: Diagnosis not present

## 2017-01-02 DIAGNOSIS — N390 Urinary tract infection, site not specified: Secondary | ICD-10-CM | POA: Diagnosis not present

## 2017-01-02 DIAGNOSIS — E119 Type 2 diabetes mellitus without complications: Secondary | ICD-10-CM | POA: Diagnosis not present

## 2017-01-03 DIAGNOSIS — R278 Other lack of coordination: Secondary | ICD-10-CM | POA: Diagnosis not present

## 2017-01-03 DIAGNOSIS — R2689 Other abnormalities of gait and mobility: Secondary | ICD-10-CM | POA: Diagnosis not present

## 2017-01-03 DIAGNOSIS — I10 Essential (primary) hypertension: Secondary | ICD-10-CM | POA: Diagnosis not present

## 2017-01-03 DIAGNOSIS — E785 Hyperlipidemia, unspecified: Secondary | ICD-10-CM | POA: Diagnosis not present

## 2017-01-03 DIAGNOSIS — G309 Alzheimer's disease, unspecified: Secondary | ICD-10-CM | POA: Diagnosis not present

## 2017-01-03 DIAGNOSIS — M6281 Muscle weakness (generalized): Secondary | ICD-10-CM | POA: Diagnosis not present

## 2017-01-03 DIAGNOSIS — R1311 Dysphagia, oral phase: Secondary | ICD-10-CM | POA: Diagnosis not present

## 2017-01-05 DIAGNOSIS — R278 Other lack of coordination: Secondary | ICD-10-CM | POA: Diagnosis not present

## 2017-01-05 DIAGNOSIS — R2689 Other abnormalities of gait and mobility: Secondary | ICD-10-CM | POA: Diagnosis not present

## 2017-01-05 DIAGNOSIS — M6281 Muscle weakness (generalized): Secondary | ICD-10-CM | POA: Diagnosis not present

## 2017-01-05 DIAGNOSIS — E785 Hyperlipidemia, unspecified: Secondary | ICD-10-CM | POA: Diagnosis not present

## 2017-01-05 DIAGNOSIS — R1311 Dysphagia, oral phase: Secondary | ICD-10-CM | POA: Diagnosis not present

## 2017-01-05 DIAGNOSIS — G309 Alzheimer's disease, unspecified: Secondary | ICD-10-CM | POA: Diagnosis not present

## 2017-01-05 DIAGNOSIS — I10 Essential (primary) hypertension: Secondary | ICD-10-CM | POA: Diagnosis not present

## 2017-01-06 DIAGNOSIS — I10 Essential (primary) hypertension: Secondary | ICD-10-CM | POA: Diagnosis not present

## 2017-01-06 DIAGNOSIS — G309 Alzheimer's disease, unspecified: Secondary | ICD-10-CM | POA: Diagnosis not present

## 2017-01-06 DIAGNOSIS — M6281 Muscle weakness (generalized): Secondary | ICD-10-CM | POA: Diagnosis not present

## 2017-01-06 DIAGNOSIS — R278 Other lack of coordination: Secondary | ICD-10-CM | POA: Diagnosis not present

## 2017-01-06 DIAGNOSIS — R1311 Dysphagia, oral phase: Secondary | ICD-10-CM | POA: Diagnosis not present

## 2017-01-06 DIAGNOSIS — E785 Hyperlipidemia, unspecified: Secondary | ICD-10-CM | POA: Diagnosis not present

## 2017-01-06 DIAGNOSIS — R2689 Other abnormalities of gait and mobility: Secondary | ICD-10-CM | POA: Diagnosis not present

## 2017-01-07 DIAGNOSIS — I10 Essential (primary) hypertension: Secondary | ICD-10-CM | POA: Diagnosis not present

## 2017-01-07 DIAGNOSIS — M6281 Muscle weakness (generalized): Secondary | ICD-10-CM | POA: Diagnosis not present

## 2017-01-07 DIAGNOSIS — R1311 Dysphagia, oral phase: Secondary | ICD-10-CM | POA: Diagnosis not present

## 2017-01-07 DIAGNOSIS — G309 Alzheimer's disease, unspecified: Secondary | ICD-10-CM | POA: Diagnosis not present

## 2017-01-07 DIAGNOSIS — E785 Hyperlipidemia, unspecified: Secondary | ICD-10-CM | POA: Diagnosis not present

## 2017-01-07 DIAGNOSIS — R278 Other lack of coordination: Secondary | ICD-10-CM | POA: Diagnosis not present

## 2017-01-07 DIAGNOSIS — R2689 Other abnormalities of gait and mobility: Secondary | ICD-10-CM | POA: Diagnosis not present

## 2017-01-08 DIAGNOSIS — I10 Essential (primary) hypertension: Secondary | ICD-10-CM | POA: Diagnosis not present

## 2017-01-08 DIAGNOSIS — K219 Gastro-esophageal reflux disease without esophagitis: Secondary | ICD-10-CM | POA: Diagnosis not present

## 2017-01-08 DIAGNOSIS — R2689 Other abnormalities of gait and mobility: Secondary | ICD-10-CM | POA: Diagnosis not present

## 2017-01-08 DIAGNOSIS — M6281 Muscle weakness (generalized): Secondary | ICD-10-CM | POA: Diagnosis not present

## 2017-01-08 DIAGNOSIS — R1311 Dysphagia, oral phase: Secondary | ICD-10-CM | POA: Diagnosis not present

## 2017-01-08 DIAGNOSIS — G309 Alzheimer's disease, unspecified: Secondary | ICD-10-CM | POA: Diagnosis not present

## 2017-01-08 DIAGNOSIS — E785 Hyperlipidemia, unspecified: Secondary | ICD-10-CM | POA: Diagnosis not present

## 2017-01-08 DIAGNOSIS — R278 Other lack of coordination: Secondary | ICD-10-CM | POA: Diagnosis not present

## 2017-01-09 DIAGNOSIS — E785 Hyperlipidemia, unspecified: Secondary | ICD-10-CM | POA: Diagnosis not present

## 2017-01-09 DIAGNOSIS — R278 Other lack of coordination: Secondary | ICD-10-CM | POA: Diagnosis not present

## 2017-01-09 DIAGNOSIS — I10 Essential (primary) hypertension: Secondary | ICD-10-CM | POA: Diagnosis not present

## 2017-01-09 DIAGNOSIS — M6281 Muscle weakness (generalized): Secondary | ICD-10-CM | POA: Diagnosis not present

## 2017-01-09 DIAGNOSIS — R2689 Other abnormalities of gait and mobility: Secondary | ICD-10-CM | POA: Diagnosis not present

## 2017-01-09 DIAGNOSIS — G309 Alzheimer's disease, unspecified: Secondary | ICD-10-CM | POA: Diagnosis not present

## 2017-01-09 DIAGNOSIS — R1311 Dysphagia, oral phase: Secondary | ICD-10-CM | POA: Diagnosis not present

## 2017-01-09 DIAGNOSIS — K219 Gastro-esophageal reflux disease without esophagitis: Secondary | ICD-10-CM | POA: Diagnosis not present

## 2017-01-10 DIAGNOSIS — R2689 Other abnormalities of gait and mobility: Secondary | ICD-10-CM | POA: Diagnosis not present

## 2017-01-10 DIAGNOSIS — E785 Hyperlipidemia, unspecified: Secondary | ICD-10-CM | POA: Diagnosis not present

## 2017-01-10 DIAGNOSIS — G309 Alzheimer's disease, unspecified: Secondary | ICD-10-CM | POA: Diagnosis not present

## 2017-01-10 DIAGNOSIS — R278 Other lack of coordination: Secondary | ICD-10-CM | POA: Diagnosis not present

## 2017-01-10 DIAGNOSIS — I10 Essential (primary) hypertension: Secondary | ICD-10-CM | POA: Diagnosis not present

## 2017-01-10 DIAGNOSIS — M6281 Muscle weakness (generalized): Secondary | ICD-10-CM | POA: Diagnosis not present

## 2017-01-10 DIAGNOSIS — K219 Gastro-esophageal reflux disease without esophagitis: Secondary | ICD-10-CM | POA: Diagnosis not present

## 2017-01-10 DIAGNOSIS — R1311 Dysphagia, oral phase: Secondary | ICD-10-CM | POA: Diagnosis not present

## 2017-01-11 DIAGNOSIS — R278 Other lack of coordination: Secondary | ICD-10-CM | POA: Diagnosis not present

## 2017-01-11 DIAGNOSIS — I10 Essential (primary) hypertension: Secondary | ICD-10-CM | POA: Diagnosis not present

## 2017-01-11 DIAGNOSIS — R2689 Other abnormalities of gait and mobility: Secondary | ICD-10-CM | POA: Diagnosis not present

## 2017-01-11 DIAGNOSIS — G309 Alzheimer's disease, unspecified: Secondary | ICD-10-CM | POA: Diagnosis not present

## 2017-01-11 DIAGNOSIS — R1311 Dysphagia, oral phase: Secondary | ICD-10-CM | POA: Diagnosis not present

## 2017-01-11 DIAGNOSIS — M6281 Muscle weakness (generalized): Secondary | ICD-10-CM | POA: Diagnosis not present

## 2017-01-11 DIAGNOSIS — K219 Gastro-esophageal reflux disease without esophagitis: Secondary | ICD-10-CM | POA: Diagnosis not present

## 2017-01-11 DIAGNOSIS — E785 Hyperlipidemia, unspecified: Secondary | ICD-10-CM | POA: Diagnosis not present

## 2017-01-12 DIAGNOSIS — M6281 Muscle weakness (generalized): Secondary | ICD-10-CM | POA: Diagnosis not present

## 2017-01-12 DIAGNOSIS — E785 Hyperlipidemia, unspecified: Secondary | ICD-10-CM | POA: Diagnosis not present

## 2017-01-12 DIAGNOSIS — G309 Alzheimer's disease, unspecified: Secondary | ICD-10-CM | POA: Diagnosis not present

## 2017-01-12 DIAGNOSIS — I10 Essential (primary) hypertension: Secondary | ICD-10-CM | POA: Diagnosis not present

## 2017-01-12 DIAGNOSIS — K219 Gastro-esophageal reflux disease without esophagitis: Secondary | ICD-10-CM | POA: Diagnosis not present

## 2017-01-12 DIAGNOSIS — R278 Other lack of coordination: Secondary | ICD-10-CM | POA: Diagnosis not present

## 2017-01-12 DIAGNOSIS — R1311 Dysphagia, oral phase: Secondary | ICD-10-CM | POA: Diagnosis not present

## 2017-01-12 DIAGNOSIS — R2689 Other abnormalities of gait and mobility: Secondary | ICD-10-CM | POA: Diagnosis not present

## 2017-01-13 DIAGNOSIS — E785 Hyperlipidemia, unspecified: Secondary | ICD-10-CM | POA: Diagnosis not present

## 2017-01-13 DIAGNOSIS — R2689 Other abnormalities of gait and mobility: Secondary | ICD-10-CM | POA: Diagnosis not present

## 2017-01-13 DIAGNOSIS — M6281 Muscle weakness (generalized): Secondary | ICD-10-CM | POA: Diagnosis not present

## 2017-01-13 DIAGNOSIS — R278 Other lack of coordination: Secondary | ICD-10-CM | POA: Diagnosis not present

## 2017-01-13 DIAGNOSIS — R1311 Dysphagia, oral phase: Secondary | ICD-10-CM | POA: Diagnosis not present

## 2017-01-13 DIAGNOSIS — G309 Alzheimer's disease, unspecified: Secondary | ICD-10-CM | POA: Diagnosis not present

## 2017-01-13 DIAGNOSIS — K219 Gastro-esophageal reflux disease without esophagitis: Secondary | ICD-10-CM | POA: Diagnosis not present

## 2017-01-13 DIAGNOSIS — I10 Essential (primary) hypertension: Secondary | ICD-10-CM | POA: Diagnosis not present

## 2017-01-14 DIAGNOSIS — K219 Gastro-esophageal reflux disease without esophagitis: Secondary | ICD-10-CM | POA: Diagnosis not present

## 2017-01-14 DIAGNOSIS — R1311 Dysphagia, oral phase: Secondary | ICD-10-CM | POA: Diagnosis not present

## 2017-01-14 DIAGNOSIS — E785 Hyperlipidemia, unspecified: Secondary | ICD-10-CM | POA: Diagnosis not present

## 2017-01-14 DIAGNOSIS — G309 Alzheimer's disease, unspecified: Secondary | ICD-10-CM | POA: Diagnosis not present

## 2017-01-14 DIAGNOSIS — M6281 Muscle weakness (generalized): Secondary | ICD-10-CM | POA: Diagnosis not present

## 2017-01-14 DIAGNOSIS — R278 Other lack of coordination: Secondary | ICD-10-CM | POA: Diagnosis not present

## 2017-01-14 DIAGNOSIS — I10 Essential (primary) hypertension: Secondary | ICD-10-CM | POA: Diagnosis not present

## 2017-01-14 DIAGNOSIS — R2689 Other abnormalities of gait and mobility: Secondary | ICD-10-CM | POA: Diagnosis not present

## 2017-01-15 DIAGNOSIS — M6281 Muscle weakness (generalized): Secondary | ICD-10-CM | POA: Diagnosis not present

## 2017-01-15 DIAGNOSIS — K219 Gastro-esophageal reflux disease without esophagitis: Secondary | ICD-10-CM | POA: Diagnosis not present

## 2017-01-15 DIAGNOSIS — R2689 Other abnormalities of gait and mobility: Secondary | ICD-10-CM | POA: Diagnosis not present

## 2017-01-15 DIAGNOSIS — R278 Other lack of coordination: Secondary | ICD-10-CM | POA: Diagnosis not present

## 2017-01-15 DIAGNOSIS — R1311 Dysphagia, oral phase: Secondary | ICD-10-CM | POA: Diagnosis not present

## 2017-01-15 DIAGNOSIS — G309 Alzheimer's disease, unspecified: Secondary | ICD-10-CM | POA: Diagnosis not present

## 2017-01-15 DIAGNOSIS — E785 Hyperlipidemia, unspecified: Secondary | ICD-10-CM | POA: Diagnosis not present

## 2017-01-15 DIAGNOSIS — I10 Essential (primary) hypertension: Secondary | ICD-10-CM | POA: Diagnosis not present

## 2017-01-16 DIAGNOSIS — G309 Alzheimer's disease, unspecified: Secondary | ICD-10-CM | POA: Diagnosis not present

## 2017-01-16 DIAGNOSIS — J449 Chronic obstructive pulmonary disease, unspecified: Secondary | ICD-10-CM | POA: Diagnosis not present

## 2017-01-16 DIAGNOSIS — I10 Essential (primary) hypertension: Secondary | ICD-10-CM | POA: Diagnosis not present

## 2017-01-16 DIAGNOSIS — R2689 Other abnormalities of gait and mobility: Secondary | ICD-10-CM | POA: Diagnosis not present

## 2017-01-16 DIAGNOSIS — R278 Other lack of coordination: Secondary | ICD-10-CM | POA: Diagnosis not present

## 2017-01-16 DIAGNOSIS — E119 Type 2 diabetes mellitus without complications: Secondary | ICD-10-CM | POA: Diagnosis not present

## 2017-01-16 DIAGNOSIS — E785 Hyperlipidemia, unspecified: Secondary | ICD-10-CM | POA: Diagnosis not present

## 2017-01-16 DIAGNOSIS — R1311 Dysphagia, oral phase: Secondary | ICD-10-CM | POA: Diagnosis not present

## 2017-01-16 DIAGNOSIS — M6281 Muscle weakness (generalized): Secondary | ICD-10-CM | POA: Diagnosis not present

## 2017-01-16 DIAGNOSIS — K219 Gastro-esophageal reflux disease without esophagitis: Secondary | ICD-10-CM | POA: Diagnosis not present

## 2017-01-17 DIAGNOSIS — E785 Hyperlipidemia, unspecified: Secondary | ICD-10-CM | POA: Diagnosis not present

## 2017-01-17 DIAGNOSIS — G309 Alzheimer's disease, unspecified: Secondary | ICD-10-CM | POA: Diagnosis not present

## 2017-01-17 DIAGNOSIS — R278 Other lack of coordination: Secondary | ICD-10-CM | POA: Diagnosis not present

## 2017-01-17 DIAGNOSIS — R2689 Other abnormalities of gait and mobility: Secondary | ICD-10-CM | POA: Diagnosis not present

## 2017-01-17 DIAGNOSIS — M6281 Muscle weakness (generalized): Secondary | ICD-10-CM | POA: Diagnosis not present

## 2017-01-17 DIAGNOSIS — R1311 Dysphagia, oral phase: Secondary | ICD-10-CM | POA: Diagnosis not present

## 2017-01-17 DIAGNOSIS — I10 Essential (primary) hypertension: Secondary | ICD-10-CM | POA: Diagnosis not present

## 2017-01-17 DIAGNOSIS — K219 Gastro-esophageal reflux disease without esophagitis: Secondary | ICD-10-CM | POA: Diagnosis not present

## 2017-01-19 DIAGNOSIS — E785 Hyperlipidemia, unspecified: Secondary | ICD-10-CM | POA: Diagnosis not present

## 2017-01-19 DIAGNOSIS — R1311 Dysphagia, oral phase: Secondary | ICD-10-CM | POA: Diagnosis not present

## 2017-01-19 DIAGNOSIS — G309 Alzheimer's disease, unspecified: Secondary | ICD-10-CM | POA: Diagnosis not present

## 2017-01-19 DIAGNOSIS — M6281 Muscle weakness (generalized): Secondary | ICD-10-CM | POA: Diagnosis not present

## 2017-01-19 DIAGNOSIS — I10 Essential (primary) hypertension: Secondary | ICD-10-CM | POA: Diagnosis not present

## 2017-01-19 DIAGNOSIS — R278 Other lack of coordination: Secondary | ICD-10-CM | POA: Diagnosis not present

## 2017-01-19 DIAGNOSIS — K219 Gastro-esophageal reflux disease without esophagitis: Secondary | ICD-10-CM | POA: Diagnosis not present

## 2017-01-19 DIAGNOSIS — R2689 Other abnormalities of gait and mobility: Secondary | ICD-10-CM | POA: Diagnosis not present

## 2017-01-23 DIAGNOSIS — E785 Hyperlipidemia, unspecified: Secondary | ICD-10-CM | POA: Diagnosis not present

## 2017-01-23 DIAGNOSIS — E119 Type 2 diabetes mellitus without complications: Secondary | ICD-10-CM | POA: Diagnosis not present

## 2017-01-23 DIAGNOSIS — I10 Essential (primary) hypertension: Secondary | ICD-10-CM | POA: Diagnosis not present

## 2017-01-23 DIAGNOSIS — G309 Alzheimer's disease, unspecified: Secondary | ICD-10-CM | POA: Diagnosis not present

## 2017-01-28 DIAGNOSIS — L219 Seborrheic dermatitis, unspecified: Secondary | ICD-10-CM | POA: Diagnosis not present

## 2017-01-28 DIAGNOSIS — E785 Hyperlipidemia, unspecified: Secondary | ICD-10-CM | POA: Diagnosis not present

## 2017-01-28 DIAGNOSIS — I10 Essential (primary) hypertension: Secondary | ICD-10-CM | POA: Diagnosis not present

## 2017-01-28 DIAGNOSIS — E119 Type 2 diabetes mellitus without complications: Secondary | ICD-10-CM | POA: Diagnosis not present

## 2017-02-20 DIAGNOSIS — E119 Type 2 diabetes mellitus without complications: Secondary | ICD-10-CM | POA: Diagnosis not present

## 2017-02-20 DIAGNOSIS — J449 Chronic obstructive pulmonary disease, unspecified: Secondary | ICD-10-CM | POA: Diagnosis not present

## 2017-02-20 DIAGNOSIS — E46 Unspecified protein-calorie malnutrition: Secondary | ICD-10-CM | POA: Diagnosis not present

## 2017-02-20 DIAGNOSIS — I10 Essential (primary) hypertension: Secondary | ICD-10-CM | POA: Diagnosis not present

## 2017-03-06 DIAGNOSIS — L219 Seborrheic dermatitis, unspecified: Secondary | ICD-10-CM | POA: Diagnosis not present

## 2017-03-06 DIAGNOSIS — I10 Essential (primary) hypertension: Secondary | ICD-10-CM | POA: Diagnosis not present

## 2017-03-06 DIAGNOSIS — G309 Alzheimer's disease, unspecified: Secondary | ICD-10-CM | POA: Diagnosis not present

## 2017-03-10 DIAGNOSIS — E119 Type 2 diabetes mellitus without complications: Secondary | ICD-10-CM | POA: Diagnosis not present

## 2017-03-27 DIAGNOSIS — E119 Type 2 diabetes mellitus without complications: Secondary | ICD-10-CM | POA: Diagnosis not present

## 2017-03-27 DIAGNOSIS — I10 Essential (primary) hypertension: Secondary | ICD-10-CM | POA: Diagnosis not present

## 2017-03-27 DIAGNOSIS — J449 Chronic obstructive pulmonary disease, unspecified: Secondary | ICD-10-CM | POA: Diagnosis not present

## 2017-04-02 DIAGNOSIS — K219 Gastro-esophageal reflux disease without esophagitis: Secondary | ICD-10-CM | POA: Diagnosis not present

## 2017-04-02 DIAGNOSIS — R2689 Other abnormalities of gait and mobility: Secondary | ICD-10-CM | POA: Diagnosis not present

## 2017-04-02 DIAGNOSIS — I10 Essential (primary) hypertension: Secondary | ICD-10-CM | POA: Diagnosis not present

## 2017-04-02 DIAGNOSIS — R278 Other lack of coordination: Secondary | ICD-10-CM | POA: Diagnosis not present

## 2017-04-02 DIAGNOSIS — E119 Type 2 diabetes mellitus without complications: Secondary | ICD-10-CM | POA: Diagnosis not present

## 2017-04-02 DIAGNOSIS — E785 Hyperlipidemia, unspecified: Secondary | ICD-10-CM | POA: Diagnosis not present

## 2017-04-02 DIAGNOSIS — G309 Alzheimer's disease, unspecified: Secondary | ICD-10-CM | POA: Diagnosis not present

## 2017-04-02 DIAGNOSIS — M6281 Muscle weakness (generalized): Secondary | ICD-10-CM | POA: Diagnosis not present

## 2017-04-03 DIAGNOSIS — R278 Other lack of coordination: Secondary | ICD-10-CM | POA: Diagnosis not present

## 2017-04-03 DIAGNOSIS — K219 Gastro-esophageal reflux disease without esophagitis: Secondary | ICD-10-CM | POA: Diagnosis not present

## 2017-04-03 DIAGNOSIS — R2689 Other abnormalities of gait and mobility: Secondary | ICD-10-CM | POA: Diagnosis not present

## 2017-04-03 DIAGNOSIS — E785 Hyperlipidemia, unspecified: Secondary | ICD-10-CM | POA: Diagnosis not present

## 2017-04-03 DIAGNOSIS — M6281 Muscle weakness (generalized): Secondary | ICD-10-CM | POA: Diagnosis not present

## 2017-04-03 DIAGNOSIS — I10 Essential (primary) hypertension: Secondary | ICD-10-CM | POA: Diagnosis not present

## 2017-04-03 DIAGNOSIS — G309 Alzheimer's disease, unspecified: Secondary | ICD-10-CM | POA: Diagnosis not present

## 2017-04-03 DIAGNOSIS — E119 Type 2 diabetes mellitus without complications: Secondary | ICD-10-CM | POA: Diagnosis not present

## 2017-04-06 DIAGNOSIS — K219 Gastro-esophageal reflux disease without esophagitis: Secondary | ICD-10-CM | POA: Diagnosis not present

## 2017-04-06 DIAGNOSIS — R278 Other lack of coordination: Secondary | ICD-10-CM | POA: Diagnosis not present

## 2017-04-06 DIAGNOSIS — R2689 Other abnormalities of gait and mobility: Secondary | ICD-10-CM | POA: Diagnosis not present

## 2017-04-06 DIAGNOSIS — M6281 Muscle weakness (generalized): Secondary | ICD-10-CM | POA: Diagnosis not present

## 2017-04-06 DIAGNOSIS — E785 Hyperlipidemia, unspecified: Secondary | ICD-10-CM | POA: Diagnosis not present

## 2017-04-06 DIAGNOSIS — I10 Essential (primary) hypertension: Secondary | ICD-10-CM | POA: Diagnosis not present

## 2017-04-06 DIAGNOSIS — G309 Alzheimer's disease, unspecified: Secondary | ICD-10-CM | POA: Diagnosis not present

## 2017-04-06 DIAGNOSIS — E119 Type 2 diabetes mellitus without complications: Secondary | ICD-10-CM | POA: Diagnosis not present

## 2017-04-07 DIAGNOSIS — G309 Alzheimer's disease, unspecified: Secondary | ICD-10-CM | POA: Diagnosis not present

## 2017-04-07 DIAGNOSIS — E785 Hyperlipidemia, unspecified: Secondary | ICD-10-CM | POA: Diagnosis not present

## 2017-04-07 DIAGNOSIS — E119 Type 2 diabetes mellitus without complications: Secondary | ICD-10-CM | POA: Diagnosis not present

## 2017-04-07 DIAGNOSIS — R2689 Other abnormalities of gait and mobility: Secondary | ICD-10-CM | POA: Diagnosis not present

## 2017-04-07 DIAGNOSIS — I10 Essential (primary) hypertension: Secondary | ICD-10-CM | POA: Diagnosis not present

## 2017-04-07 DIAGNOSIS — K219 Gastro-esophageal reflux disease without esophagitis: Secondary | ICD-10-CM | POA: Diagnosis not present

## 2017-04-07 DIAGNOSIS — M6281 Muscle weakness (generalized): Secondary | ICD-10-CM | POA: Diagnosis not present

## 2017-04-07 DIAGNOSIS — R278 Other lack of coordination: Secondary | ICD-10-CM | POA: Diagnosis not present

## 2017-04-08 DIAGNOSIS — M6281 Muscle weakness (generalized): Secondary | ICD-10-CM | POA: Diagnosis not present

## 2017-04-08 DIAGNOSIS — I10 Essential (primary) hypertension: Secondary | ICD-10-CM | POA: Diagnosis not present

## 2017-04-08 DIAGNOSIS — E119 Type 2 diabetes mellitus without complications: Secondary | ICD-10-CM | POA: Diagnosis not present

## 2017-04-08 DIAGNOSIS — K219 Gastro-esophageal reflux disease without esophagitis: Secondary | ICD-10-CM | POA: Diagnosis not present

## 2017-04-08 DIAGNOSIS — R278 Other lack of coordination: Secondary | ICD-10-CM | POA: Diagnosis not present

## 2017-04-08 DIAGNOSIS — E785 Hyperlipidemia, unspecified: Secondary | ICD-10-CM | POA: Diagnosis not present

## 2017-04-08 DIAGNOSIS — G309 Alzheimer's disease, unspecified: Secondary | ICD-10-CM | POA: Diagnosis not present

## 2017-04-08 DIAGNOSIS — R2689 Other abnormalities of gait and mobility: Secondary | ICD-10-CM | POA: Diagnosis not present

## 2017-04-09 DIAGNOSIS — G309 Alzheimer's disease, unspecified: Secondary | ICD-10-CM | POA: Diagnosis not present

## 2017-04-09 DIAGNOSIS — R278 Other lack of coordination: Secondary | ICD-10-CM | POA: Diagnosis not present

## 2017-04-09 DIAGNOSIS — E119 Type 2 diabetes mellitus without complications: Secondary | ICD-10-CM | POA: Diagnosis not present

## 2017-04-09 DIAGNOSIS — K219 Gastro-esophageal reflux disease without esophagitis: Secondary | ICD-10-CM | POA: Diagnosis not present

## 2017-04-09 DIAGNOSIS — E785 Hyperlipidemia, unspecified: Secondary | ICD-10-CM | POA: Diagnosis not present

## 2017-04-09 DIAGNOSIS — I10 Essential (primary) hypertension: Secondary | ICD-10-CM | POA: Diagnosis not present

## 2017-04-09 DIAGNOSIS — R2689 Other abnormalities of gait and mobility: Secondary | ICD-10-CM | POA: Diagnosis not present

## 2017-04-09 DIAGNOSIS — M6281 Muscle weakness (generalized): Secondary | ICD-10-CM | POA: Diagnosis not present

## 2017-04-10 DIAGNOSIS — R633 Feeding difficulties: Secondary | ICD-10-CM | POA: Diagnosis not present

## 2017-04-10 DIAGNOSIS — I10 Essential (primary) hypertension: Secondary | ICD-10-CM | POA: Diagnosis not present

## 2017-04-10 DIAGNOSIS — R278 Other lack of coordination: Secondary | ICD-10-CM | POA: Diagnosis not present

## 2017-04-10 DIAGNOSIS — E119 Type 2 diabetes mellitus without complications: Secondary | ICD-10-CM | POA: Diagnosis not present

## 2017-04-10 DIAGNOSIS — R2689 Other abnormalities of gait and mobility: Secondary | ICD-10-CM | POA: Diagnosis not present

## 2017-04-10 DIAGNOSIS — K219 Gastro-esophageal reflux disease without esophagitis: Secondary | ICD-10-CM | POA: Diagnosis not present

## 2017-04-10 DIAGNOSIS — G309 Alzheimer's disease, unspecified: Secondary | ICD-10-CM | POA: Diagnosis not present

## 2017-04-10 DIAGNOSIS — J449 Chronic obstructive pulmonary disease, unspecified: Secondary | ICD-10-CM | POA: Diagnosis not present

## 2017-04-10 DIAGNOSIS — E46 Unspecified protein-calorie malnutrition: Secondary | ICD-10-CM | POA: Diagnosis not present

## 2017-04-10 DIAGNOSIS — M6281 Muscle weakness (generalized): Secondary | ICD-10-CM | POA: Diagnosis not present

## 2017-04-13 DIAGNOSIS — R278 Other lack of coordination: Secondary | ICD-10-CM | POA: Diagnosis not present

## 2017-04-13 DIAGNOSIS — K219 Gastro-esophageal reflux disease without esophagitis: Secondary | ICD-10-CM | POA: Diagnosis not present

## 2017-04-13 DIAGNOSIS — R633 Feeding difficulties: Secondary | ICD-10-CM | POA: Diagnosis not present

## 2017-04-13 DIAGNOSIS — M6281 Muscle weakness (generalized): Secondary | ICD-10-CM | POA: Diagnosis not present

## 2017-04-13 DIAGNOSIS — I10 Essential (primary) hypertension: Secondary | ICD-10-CM | POA: Diagnosis not present

## 2017-04-13 DIAGNOSIS — G309 Alzheimer's disease, unspecified: Secondary | ICD-10-CM | POA: Diagnosis not present

## 2017-04-13 DIAGNOSIS — E119 Type 2 diabetes mellitus without complications: Secondary | ICD-10-CM | POA: Diagnosis not present

## 2017-04-13 DIAGNOSIS — R2689 Other abnormalities of gait and mobility: Secondary | ICD-10-CM | POA: Diagnosis not present

## 2017-04-14 DIAGNOSIS — R278 Other lack of coordination: Secondary | ICD-10-CM | POA: Diagnosis not present

## 2017-04-14 DIAGNOSIS — K219 Gastro-esophageal reflux disease without esophagitis: Secondary | ICD-10-CM | POA: Diagnosis not present

## 2017-04-14 DIAGNOSIS — M6281 Muscle weakness (generalized): Secondary | ICD-10-CM | POA: Diagnosis not present

## 2017-04-14 DIAGNOSIS — E119 Type 2 diabetes mellitus without complications: Secondary | ICD-10-CM | POA: Diagnosis not present

## 2017-04-14 DIAGNOSIS — R2689 Other abnormalities of gait and mobility: Secondary | ICD-10-CM | POA: Diagnosis not present

## 2017-04-14 DIAGNOSIS — R633 Feeding difficulties: Secondary | ICD-10-CM | POA: Diagnosis not present

## 2017-04-14 DIAGNOSIS — G309 Alzheimer's disease, unspecified: Secondary | ICD-10-CM | POA: Diagnosis not present

## 2017-04-14 DIAGNOSIS — I10 Essential (primary) hypertension: Secondary | ICD-10-CM | POA: Diagnosis not present

## 2017-04-15 DIAGNOSIS — M6281 Muscle weakness (generalized): Secondary | ICD-10-CM | POA: Diagnosis not present

## 2017-04-15 DIAGNOSIS — R2689 Other abnormalities of gait and mobility: Secondary | ICD-10-CM | POA: Diagnosis not present

## 2017-04-15 DIAGNOSIS — K219 Gastro-esophageal reflux disease without esophagitis: Secondary | ICD-10-CM | POA: Diagnosis not present

## 2017-04-15 DIAGNOSIS — R633 Feeding difficulties: Secondary | ICD-10-CM | POA: Diagnosis not present

## 2017-04-15 DIAGNOSIS — G309 Alzheimer's disease, unspecified: Secondary | ICD-10-CM | POA: Diagnosis not present

## 2017-04-15 DIAGNOSIS — E119 Type 2 diabetes mellitus without complications: Secondary | ICD-10-CM | POA: Diagnosis not present

## 2017-04-15 DIAGNOSIS — R278 Other lack of coordination: Secondary | ICD-10-CM | POA: Diagnosis not present

## 2017-04-15 DIAGNOSIS — I10 Essential (primary) hypertension: Secondary | ICD-10-CM | POA: Diagnosis not present

## 2017-04-16 DIAGNOSIS — M6281 Muscle weakness (generalized): Secondary | ICD-10-CM | POA: Diagnosis not present

## 2017-04-16 DIAGNOSIS — I10 Essential (primary) hypertension: Secondary | ICD-10-CM | POA: Diagnosis not present

## 2017-04-16 DIAGNOSIS — R278 Other lack of coordination: Secondary | ICD-10-CM | POA: Diagnosis not present

## 2017-04-16 DIAGNOSIS — R633 Feeding difficulties: Secondary | ICD-10-CM | POA: Diagnosis not present

## 2017-04-16 DIAGNOSIS — K219 Gastro-esophageal reflux disease without esophagitis: Secondary | ICD-10-CM | POA: Diagnosis not present

## 2017-04-16 DIAGNOSIS — G309 Alzheimer's disease, unspecified: Secondary | ICD-10-CM | POA: Diagnosis not present

## 2017-04-16 DIAGNOSIS — R2689 Other abnormalities of gait and mobility: Secondary | ICD-10-CM | POA: Diagnosis not present

## 2017-04-16 DIAGNOSIS — E119 Type 2 diabetes mellitus without complications: Secondary | ICD-10-CM | POA: Diagnosis not present

## 2017-04-17 DIAGNOSIS — R2689 Other abnormalities of gait and mobility: Secondary | ICD-10-CM | POA: Diagnosis not present

## 2017-04-17 DIAGNOSIS — M6281 Muscle weakness (generalized): Secondary | ICD-10-CM | POA: Diagnosis not present

## 2017-04-17 DIAGNOSIS — K219 Gastro-esophageal reflux disease without esophagitis: Secondary | ICD-10-CM | POA: Diagnosis not present

## 2017-04-17 DIAGNOSIS — E119 Type 2 diabetes mellitus without complications: Secondary | ICD-10-CM | POA: Diagnosis not present

## 2017-04-17 DIAGNOSIS — I10 Essential (primary) hypertension: Secondary | ICD-10-CM | POA: Diagnosis not present

## 2017-04-17 DIAGNOSIS — G309 Alzheimer's disease, unspecified: Secondary | ICD-10-CM | POA: Diagnosis not present

## 2017-04-17 DIAGNOSIS — R633 Feeding difficulties: Secondary | ICD-10-CM | POA: Diagnosis not present

## 2017-04-17 DIAGNOSIS — R278 Other lack of coordination: Secondary | ICD-10-CM | POA: Diagnosis not present

## 2017-04-20 DIAGNOSIS — G309 Alzheimer's disease, unspecified: Secondary | ICD-10-CM | POA: Diagnosis not present

## 2017-04-20 DIAGNOSIS — E119 Type 2 diabetes mellitus without complications: Secondary | ICD-10-CM | POA: Diagnosis not present

## 2017-04-20 DIAGNOSIS — K219 Gastro-esophageal reflux disease without esophagitis: Secondary | ICD-10-CM | POA: Diagnosis not present

## 2017-04-20 DIAGNOSIS — R633 Feeding difficulties: Secondary | ICD-10-CM | POA: Diagnosis not present

## 2017-04-20 DIAGNOSIS — R2689 Other abnormalities of gait and mobility: Secondary | ICD-10-CM | POA: Diagnosis not present

## 2017-04-20 DIAGNOSIS — I10 Essential (primary) hypertension: Secondary | ICD-10-CM | POA: Diagnosis not present

## 2017-04-20 DIAGNOSIS — M6281 Muscle weakness (generalized): Secondary | ICD-10-CM | POA: Diagnosis not present

## 2017-04-20 DIAGNOSIS — R278 Other lack of coordination: Secondary | ICD-10-CM | POA: Diagnosis not present

## 2017-04-21 DIAGNOSIS — R633 Feeding difficulties: Secondary | ICD-10-CM | POA: Diagnosis not present

## 2017-04-21 DIAGNOSIS — R2689 Other abnormalities of gait and mobility: Secondary | ICD-10-CM | POA: Diagnosis not present

## 2017-04-21 DIAGNOSIS — E119 Type 2 diabetes mellitus without complications: Secondary | ICD-10-CM | POA: Diagnosis not present

## 2017-04-21 DIAGNOSIS — M6281 Muscle weakness (generalized): Secondary | ICD-10-CM | POA: Diagnosis not present

## 2017-04-21 DIAGNOSIS — G309 Alzheimer's disease, unspecified: Secondary | ICD-10-CM | POA: Diagnosis not present

## 2017-04-21 DIAGNOSIS — K219 Gastro-esophageal reflux disease without esophagitis: Secondary | ICD-10-CM | POA: Diagnosis not present

## 2017-04-21 DIAGNOSIS — I10 Essential (primary) hypertension: Secondary | ICD-10-CM | POA: Diagnosis not present

## 2017-04-21 DIAGNOSIS — R278 Other lack of coordination: Secondary | ICD-10-CM | POA: Diagnosis not present

## 2017-04-22 DIAGNOSIS — M6281 Muscle weakness (generalized): Secondary | ICD-10-CM | POA: Diagnosis not present

## 2017-04-22 DIAGNOSIS — E119 Type 2 diabetes mellitus without complications: Secondary | ICD-10-CM | POA: Diagnosis not present

## 2017-04-22 DIAGNOSIS — K219 Gastro-esophageal reflux disease without esophagitis: Secondary | ICD-10-CM | POA: Diagnosis not present

## 2017-04-22 DIAGNOSIS — R633 Feeding difficulties: Secondary | ICD-10-CM | POA: Diagnosis not present

## 2017-04-22 DIAGNOSIS — R278 Other lack of coordination: Secondary | ICD-10-CM | POA: Diagnosis not present

## 2017-04-22 DIAGNOSIS — I10 Essential (primary) hypertension: Secondary | ICD-10-CM | POA: Diagnosis not present

## 2017-04-22 DIAGNOSIS — G309 Alzheimer's disease, unspecified: Secondary | ICD-10-CM | POA: Diagnosis not present

## 2017-04-22 DIAGNOSIS — R2689 Other abnormalities of gait and mobility: Secondary | ICD-10-CM | POA: Diagnosis not present

## 2017-04-23 DIAGNOSIS — I10 Essential (primary) hypertension: Secondary | ICD-10-CM | POA: Diagnosis not present

## 2017-04-23 DIAGNOSIS — G309 Alzheimer's disease, unspecified: Secondary | ICD-10-CM | POA: Diagnosis not present

## 2017-04-23 DIAGNOSIS — K219 Gastro-esophageal reflux disease without esophagitis: Secondary | ICD-10-CM | POA: Diagnosis not present

## 2017-04-23 DIAGNOSIS — R2689 Other abnormalities of gait and mobility: Secondary | ICD-10-CM | POA: Diagnosis not present

## 2017-04-23 DIAGNOSIS — R278 Other lack of coordination: Secondary | ICD-10-CM | POA: Diagnosis not present

## 2017-04-23 DIAGNOSIS — M6281 Muscle weakness (generalized): Secondary | ICD-10-CM | POA: Diagnosis not present

## 2017-04-23 DIAGNOSIS — E119 Type 2 diabetes mellitus without complications: Secondary | ICD-10-CM | POA: Diagnosis not present

## 2017-04-23 DIAGNOSIS — R633 Feeding difficulties: Secondary | ICD-10-CM | POA: Diagnosis not present

## 2017-04-24 DIAGNOSIS — G309 Alzheimer's disease, unspecified: Secondary | ICD-10-CM | POA: Diagnosis not present

## 2017-04-24 DIAGNOSIS — E119 Type 2 diabetes mellitus without complications: Secondary | ICD-10-CM | POA: Diagnosis not present

## 2017-04-24 DIAGNOSIS — R633 Feeding difficulties: Secondary | ICD-10-CM | POA: Diagnosis not present

## 2017-04-24 DIAGNOSIS — R2689 Other abnormalities of gait and mobility: Secondary | ICD-10-CM | POA: Diagnosis not present

## 2017-04-24 DIAGNOSIS — K219 Gastro-esophageal reflux disease without esophagitis: Secondary | ICD-10-CM | POA: Diagnosis not present

## 2017-04-24 DIAGNOSIS — M6281 Muscle weakness (generalized): Secondary | ICD-10-CM | POA: Diagnosis not present

## 2017-04-24 DIAGNOSIS — I10 Essential (primary) hypertension: Secondary | ICD-10-CM | POA: Diagnosis not present

## 2017-04-24 DIAGNOSIS — R278 Other lack of coordination: Secondary | ICD-10-CM | POA: Diagnosis not present

## 2017-04-27 DIAGNOSIS — R633 Feeding difficulties: Secondary | ICD-10-CM | POA: Diagnosis not present

## 2017-04-27 DIAGNOSIS — K219 Gastro-esophageal reflux disease without esophagitis: Secondary | ICD-10-CM | POA: Diagnosis not present

## 2017-04-27 DIAGNOSIS — R278 Other lack of coordination: Secondary | ICD-10-CM | POA: Diagnosis not present

## 2017-04-27 DIAGNOSIS — E119 Type 2 diabetes mellitus without complications: Secondary | ICD-10-CM | POA: Diagnosis not present

## 2017-04-27 DIAGNOSIS — R2689 Other abnormalities of gait and mobility: Secondary | ICD-10-CM | POA: Diagnosis not present

## 2017-04-27 DIAGNOSIS — G309 Alzheimer's disease, unspecified: Secondary | ICD-10-CM | POA: Diagnosis not present

## 2017-04-27 DIAGNOSIS — I10 Essential (primary) hypertension: Secondary | ICD-10-CM | POA: Diagnosis not present

## 2017-04-27 DIAGNOSIS — M6281 Muscle weakness (generalized): Secondary | ICD-10-CM | POA: Diagnosis not present

## 2017-04-28 DIAGNOSIS — G309 Alzheimer's disease, unspecified: Secondary | ICD-10-CM | POA: Diagnosis not present

## 2017-04-28 DIAGNOSIS — R633 Feeding difficulties: Secondary | ICD-10-CM | POA: Diagnosis not present

## 2017-04-28 DIAGNOSIS — E119 Type 2 diabetes mellitus without complications: Secondary | ICD-10-CM | POA: Diagnosis not present

## 2017-04-28 DIAGNOSIS — I10 Essential (primary) hypertension: Secondary | ICD-10-CM | POA: Diagnosis not present

## 2017-04-28 DIAGNOSIS — R278 Other lack of coordination: Secondary | ICD-10-CM | POA: Diagnosis not present

## 2017-04-28 DIAGNOSIS — M6281 Muscle weakness (generalized): Secondary | ICD-10-CM | POA: Diagnosis not present

## 2017-04-28 DIAGNOSIS — R2689 Other abnormalities of gait and mobility: Secondary | ICD-10-CM | POA: Diagnosis not present

## 2017-04-28 DIAGNOSIS — K219 Gastro-esophageal reflux disease without esophagitis: Secondary | ICD-10-CM | POA: Diagnosis not present

## 2017-04-29 DIAGNOSIS — M6281 Muscle weakness (generalized): Secondary | ICD-10-CM | POA: Diagnosis not present

## 2017-04-29 DIAGNOSIS — I10 Essential (primary) hypertension: Secondary | ICD-10-CM | POA: Diagnosis not present

## 2017-04-29 DIAGNOSIS — R278 Other lack of coordination: Secondary | ICD-10-CM | POA: Diagnosis not present

## 2017-04-29 DIAGNOSIS — K219 Gastro-esophageal reflux disease without esophagitis: Secondary | ICD-10-CM | POA: Diagnosis not present

## 2017-04-29 DIAGNOSIS — R633 Feeding difficulties: Secondary | ICD-10-CM | POA: Diagnosis not present

## 2017-04-29 DIAGNOSIS — G309 Alzheimer's disease, unspecified: Secondary | ICD-10-CM | POA: Diagnosis not present

## 2017-04-29 DIAGNOSIS — R2689 Other abnormalities of gait and mobility: Secondary | ICD-10-CM | POA: Diagnosis not present

## 2017-04-29 DIAGNOSIS — E119 Type 2 diabetes mellitus without complications: Secondary | ICD-10-CM | POA: Diagnosis not present

## 2017-04-30 DIAGNOSIS — R278 Other lack of coordination: Secondary | ICD-10-CM | POA: Diagnosis not present

## 2017-04-30 DIAGNOSIS — M6281 Muscle weakness (generalized): Secondary | ICD-10-CM | POA: Diagnosis not present

## 2017-04-30 DIAGNOSIS — K219 Gastro-esophageal reflux disease without esophagitis: Secondary | ICD-10-CM | POA: Diagnosis not present

## 2017-04-30 DIAGNOSIS — G309 Alzheimer's disease, unspecified: Secondary | ICD-10-CM | POA: Diagnosis not present

## 2017-04-30 DIAGNOSIS — I10 Essential (primary) hypertension: Secondary | ICD-10-CM | POA: Diagnosis not present

## 2017-04-30 DIAGNOSIS — R2689 Other abnormalities of gait and mobility: Secondary | ICD-10-CM | POA: Diagnosis not present

## 2017-04-30 DIAGNOSIS — E119 Type 2 diabetes mellitus without complications: Secondary | ICD-10-CM | POA: Diagnosis not present

## 2017-04-30 DIAGNOSIS — R633 Feeding difficulties: Secondary | ICD-10-CM | POA: Diagnosis not present

## 2017-05-13 DIAGNOSIS — R569 Unspecified convulsions: Secondary | ICD-10-CM | POA: Diagnosis not present

## 2017-06-05 DIAGNOSIS — I10 Essential (primary) hypertension: Secondary | ICD-10-CM | POA: Diagnosis not present

## 2017-06-05 DIAGNOSIS — J449 Chronic obstructive pulmonary disease, unspecified: Secondary | ICD-10-CM | POA: Diagnosis not present

## 2017-06-05 DIAGNOSIS — M1990 Unspecified osteoarthritis, unspecified site: Secondary | ICD-10-CM | POA: Diagnosis not present

## 2017-07-02 ENCOUNTER — Telehealth: Payer: Self-pay

## 2017-07-02 NOTE — Telephone Encounter (Signed)
Patient on the quality measure schedule visit list. Patient was admitted to the [facility] in [place]. No longer our patient. No PCP listed.

## 2017-07-09 DIAGNOSIS — I10 Essential (primary) hypertension: Secondary | ICD-10-CM | POA: Diagnosis not present

## 2017-07-09 DIAGNOSIS — K580 Irritable bowel syndrome with diarrhea: Secondary | ICD-10-CM | POA: Diagnosis not present

## 2017-07-09 DIAGNOSIS — W19XXXA Unspecified fall, initial encounter: Secondary | ICD-10-CM | POA: Diagnosis not present

## 2017-07-09 DIAGNOSIS — G309 Alzheimer's disease, unspecified: Secondary | ICD-10-CM | POA: Diagnosis not present

## 2017-07-24 DIAGNOSIS — G8190 Hemiplegia, unspecified affecting unspecified side: Secondary | ICD-10-CM | POA: Diagnosis not present

## 2017-07-24 DIAGNOSIS — I10 Essential (primary) hypertension: Secondary | ICD-10-CM | POA: Diagnosis not present

## 2017-07-24 DIAGNOSIS — E46 Unspecified protein-calorie malnutrition: Secondary | ICD-10-CM | POA: Diagnosis not present

## 2017-07-24 DIAGNOSIS — I639 Cerebral infarction, unspecified: Secondary | ICD-10-CM | POA: Diagnosis not present

## 2017-08-14 DIAGNOSIS — Z79899 Other long term (current) drug therapy: Secondary | ICD-10-CM | POA: Diagnosis not present

## 2017-08-14 DIAGNOSIS — E785 Hyperlipidemia, unspecified: Secondary | ICD-10-CM | POA: Diagnosis not present

## 2017-08-14 DIAGNOSIS — D649 Anemia, unspecified: Secondary | ICD-10-CM | POA: Diagnosis not present

## 2017-08-14 DIAGNOSIS — I10 Essential (primary) hypertension: Secondary | ICD-10-CM | POA: Diagnosis not present

## 2017-09-18 DIAGNOSIS — E46 Unspecified protein-calorie malnutrition: Secondary | ICD-10-CM | POA: Diagnosis not present

## 2017-09-18 DIAGNOSIS — I639 Cerebral infarction, unspecified: Secondary | ICD-10-CM | POA: Diagnosis not present

## 2017-09-18 DIAGNOSIS — I10 Essential (primary) hypertension: Secondary | ICD-10-CM | POA: Diagnosis not present

## 2017-10-09 DIAGNOSIS — G309 Alzheimer's disease, unspecified: Secondary | ICD-10-CM | POA: Diagnosis not present

## 2017-10-09 DIAGNOSIS — M25531 Pain in right wrist: Secondary | ICD-10-CM | POA: Diagnosis not present

## 2017-10-09 DIAGNOSIS — I10 Essential (primary) hypertension: Secondary | ICD-10-CM | POA: Diagnosis not present

## 2017-10-09 DIAGNOSIS — S60221D Contusion of right hand, subsequent encounter: Secondary | ICD-10-CM | POA: Diagnosis not present

## 2017-10-09 DIAGNOSIS — E119 Type 2 diabetes mellitus without complications: Secondary | ICD-10-CM | POA: Diagnosis not present

## 2017-10-22 DIAGNOSIS — M6281 Muscle weakness (generalized): Secondary | ICD-10-CM | POA: Diagnosis not present

## 2017-10-22 DIAGNOSIS — R278 Other lack of coordination: Secondary | ICD-10-CM | POA: Diagnosis not present

## 2017-10-22 DIAGNOSIS — R633 Feeding difficulties: Secondary | ICD-10-CM | POA: Diagnosis not present

## 2017-10-22 DIAGNOSIS — K219 Gastro-esophageal reflux disease without esophagitis: Secondary | ICD-10-CM | POA: Diagnosis not present

## 2017-10-22 DIAGNOSIS — R2689 Other abnormalities of gait and mobility: Secondary | ICD-10-CM | POA: Diagnosis not present

## 2017-10-22 DIAGNOSIS — I10 Essential (primary) hypertension: Secondary | ICD-10-CM | POA: Diagnosis not present

## 2017-10-22 DIAGNOSIS — R262 Difficulty in walking, not elsewhere classified: Secondary | ICD-10-CM | POA: Diagnosis not present

## 2017-10-22 DIAGNOSIS — M19019 Primary osteoarthritis, unspecified shoulder: Secondary | ICD-10-CM | POA: Diagnosis not present

## 2017-10-22 DIAGNOSIS — E119 Type 2 diabetes mellitus without complications: Secondary | ICD-10-CM | POA: Diagnosis not present

## 2017-10-22 DIAGNOSIS — R1311 Dysphagia, oral phase: Secondary | ICD-10-CM | POA: Diagnosis not present

## 2017-10-22 DIAGNOSIS — G309 Alzheimer's disease, unspecified: Secondary | ICD-10-CM | POA: Diagnosis not present

## 2017-10-22 DIAGNOSIS — E785 Hyperlipidemia, unspecified: Secondary | ICD-10-CM | POA: Diagnosis not present

## 2017-10-23 DIAGNOSIS — G309 Alzheimer's disease, unspecified: Secondary | ICD-10-CM | POA: Diagnosis not present

## 2017-10-23 DIAGNOSIS — M6281 Muscle weakness (generalized): Secondary | ICD-10-CM | POA: Diagnosis not present

## 2017-10-23 DIAGNOSIS — M19019 Primary osteoarthritis, unspecified shoulder: Secondary | ICD-10-CM | POA: Diagnosis not present

## 2017-10-23 DIAGNOSIS — R262 Difficulty in walking, not elsewhere classified: Secondary | ICD-10-CM | POA: Diagnosis not present

## 2017-10-23 DIAGNOSIS — E785 Hyperlipidemia, unspecified: Secondary | ICD-10-CM | POA: Diagnosis not present

## 2017-10-23 DIAGNOSIS — K219 Gastro-esophageal reflux disease without esophagitis: Secondary | ICD-10-CM | POA: Diagnosis not present

## 2017-10-23 DIAGNOSIS — R278 Other lack of coordination: Secondary | ICD-10-CM | POA: Diagnosis not present

## 2017-10-23 DIAGNOSIS — R633 Feeding difficulties: Secondary | ICD-10-CM | POA: Diagnosis not present

## 2017-10-23 DIAGNOSIS — R1311 Dysphagia, oral phase: Secondary | ICD-10-CM | POA: Diagnosis not present

## 2017-10-23 DIAGNOSIS — R2689 Other abnormalities of gait and mobility: Secondary | ICD-10-CM | POA: Diagnosis not present

## 2017-10-23 DIAGNOSIS — E119 Type 2 diabetes mellitus without complications: Secondary | ICD-10-CM | POA: Diagnosis not present

## 2017-10-23 DIAGNOSIS — I10 Essential (primary) hypertension: Secondary | ICD-10-CM | POA: Diagnosis not present

## 2017-10-24 DIAGNOSIS — R633 Feeding difficulties: Secondary | ICD-10-CM | POA: Diagnosis not present

## 2017-10-24 DIAGNOSIS — G309 Alzheimer's disease, unspecified: Secondary | ICD-10-CM | POA: Diagnosis not present

## 2017-10-24 DIAGNOSIS — R262 Difficulty in walking, not elsewhere classified: Secondary | ICD-10-CM | POA: Diagnosis not present

## 2017-10-24 DIAGNOSIS — E119 Type 2 diabetes mellitus without complications: Secondary | ICD-10-CM | POA: Diagnosis not present

## 2017-10-24 DIAGNOSIS — I10 Essential (primary) hypertension: Secondary | ICD-10-CM | POA: Diagnosis not present

## 2017-10-24 DIAGNOSIS — M19019 Primary osteoarthritis, unspecified shoulder: Secondary | ICD-10-CM | POA: Diagnosis not present

## 2017-10-24 DIAGNOSIS — K219 Gastro-esophageal reflux disease without esophagitis: Secondary | ICD-10-CM | POA: Diagnosis not present

## 2017-10-24 DIAGNOSIS — R1311 Dysphagia, oral phase: Secondary | ICD-10-CM | POA: Diagnosis not present

## 2017-10-24 DIAGNOSIS — M6281 Muscle weakness (generalized): Secondary | ICD-10-CM | POA: Diagnosis not present

## 2017-10-24 DIAGNOSIS — E785 Hyperlipidemia, unspecified: Secondary | ICD-10-CM | POA: Diagnosis not present

## 2017-10-24 DIAGNOSIS — R2689 Other abnormalities of gait and mobility: Secondary | ICD-10-CM | POA: Diagnosis not present

## 2017-10-24 DIAGNOSIS — R278 Other lack of coordination: Secondary | ICD-10-CM | POA: Diagnosis not present

## 2017-10-26 DIAGNOSIS — M6281 Muscle weakness (generalized): Secondary | ICD-10-CM | POA: Diagnosis not present

## 2017-10-26 DIAGNOSIS — E119 Type 2 diabetes mellitus without complications: Secondary | ICD-10-CM | POA: Diagnosis not present

## 2017-10-26 DIAGNOSIS — R278 Other lack of coordination: Secondary | ICD-10-CM | POA: Diagnosis not present

## 2017-10-26 DIAGNOSIS — I10 Essential (primary) hypertension: Secondary | ICD-10-CM | POA: Diagnosis not present

## 2017-10-26 DIAGNOSIS — R633 Feeding difficulties: Secondary | ICD-10-CM | POA: Diagnosis not present

## 2017-10-26 DIAGNOSIS — M19019 Primary osteoarthritis, unspecified shoulder: Secondary | ICD-10-CM | POA: Diagnosis not present

## 2017-10-26 DIAGNOSIS — R1311 Dysphagia, oral phase: Secondary | ICD-10-CM | POA: Diagnosis not present

## 2017-10-26 DIAGNOSIS — R262 Difficulty in walking, not elsewhere classified: Secondary | ICD-10-CM | POA: Diagnosis not present

## 2017-10-26 DIAGNOSIS — G309 Alzheimer's disease, unspecified: Secondary | ICD-10-CM | POA: Diagnosis not present

## 2017-10-26 DIAGNOSIS — R2689 Other abnormalities of gait and mobility: Secondary | ICD-10-CM | POA: Diagnosis not present

## 2017-10-26 DIAGNOSIS — E785 Hyperlipidemia, unspecified: Secondary | ICD-10-CM | POA: Diagnosis not present

## 2017-10-26 DIAGNOSIS — K219 Gastro-esophageal reflux disease without esophagitis: Secondary | ICD-10-CM | POA: Diagnosis not present

## 2017-10-27 DIAGNOSIS — R278 Other lack of coordination: Secondary | ICD-10-CM | POA: Diagnosis not present

## 2017-10-27 DIAGNOSIS — K219 Gastro-esophageal reflux disease without esophagitis: Secondary | ICD-10-CM | POA: Diagnosis not present

## 2017-10-27 DIAGNOSIS — M6281 Muscle weakness (generalized): Secondary | ICD-10-CM | POA: Diagnosis not present

## 2017-10-27 DIAGNOSIS — R262 Difficulty in walking, not elsewhere classified: Secondary | ICD-10-CM | POA: Diagnosis not present

## 2017-10-27 DIAGNOSIS — R2689 Other abnormalities of gait and mobility: Secondary | ICD-10-CM | POA: Diagnosis not present

## 2017-10-27 DIAGNOSIS — M19019 Primary osteoarthritis, unspecified shoulder: Secondary | ICD-10-CM | POA: Diagnosis not present

## 2017-10-27 DIAGNOSIS — R633 Feeding difficulties: Secondary | ICD-10-CM | POA: Diagnosis not present

## 2017-10-27 DIAGNOSIS — R1311 Dysphagia, oral phase: Secondary | ICD-10-CM | POA: Diagnosis not present

## 2017-10-27 DIAGNOSIS — I10 Essential (primary) hypertension: Secondary | ICD-10-CM | POA: Diagnosis not present

## 2017-10-27 DIAGNOSIS — E119 Type 2 diabetes mellitus without complications: Secondary | ICD-10-CM | POA: Diagnosis not present

## 2017-10-27 DIAGNOSIS — E785 Hyperlipidemia, unspecified: Secondary | ICD-10-CM | POA: Diagnosis not present

## 2017-10-27 DIAGNOSIS — G309 Alzheimer's disease, unspecified: Secondary | ICD-10-CM | POA: Diagnosis not present

## 2017-10-28 DIAGNOSIS — R262 Difficulty in walking, not elsewhere classified: Secondary | ICD-10-CM | POA: Diagnosis not present

## 2017-10-28 DIAGNOSIS — R278 Other lack of coordination: Secondary | ICD-10-CM | POA: Diagnosis not present

## 2017-10-28 DIAGNOSIS — R1311 Dysphagia, oral phase: Secondary | ICD-10-CM | POA: Diagnosis not present

## 2017-10-28 DIAGNOSIS — M25531 Pain in right wrist: Secondary | ICD-10-CM | POA: Diagnosis not present

## 2017-10-28 DIAGNOSIS — R633 Feeding difficulties: Secondary | ICD-10-CM | POA: Diagnosis not present

## 2017-10-28 DIAGNOSIS — M19019 Primary osteoarthritis, unspecified shoulder: Secondary | ICD-10-CM | POA: Diagnosis not present

## 2017-10-28 DIAGNOSIS — E119 Type 2 diabetes mellitus without complications: Secondary | ICD-10-CM | POA: Diagnosis not present

## 2017-10-28 DIAGNOSIS — G309 Alzheimer's disease, unspecified: Secondary | ICD-10-CM | POA: Diagnosis not present

## 2017-10-28 DIAGNOSIS — K219 Gastro-esophageal reflux disease without esophagitis: Secondary | ICD-10-CM | POA: Diagnosis not present

## 2017-10-28 DIAGNOSIS — I10 Essential (primary) hypertension: Secondary | ICD-10-CM | POA: Diagnosis not present

## 2017-10-28 DIAGNOSIS — E785 Hyperlipidemia, unspecified: Secondary | ICD-10-CM | POA: Diagnosis not present

## 2017-10-28 DIAGNOSIS — M6281 Muscle weakness (generalized): Secondary | ICD-10-CM | POA: Diagnosis not present

## 2017-10-28 DIAGNOSIS — R2689 Other abnormalities of gait and mobility: Secondary | ICD-10-CM | POA: Diagnosis not present

## 2017-10-30 DIAGNOSIS — R262 Difficulty in walking, not elsewhere classified: Secondary | ICD-10-CM | POA: Diagnosis not present

## 2017-10-30 DIAGNOSIS — R1311 Dysphagia, oral phase: Secondary | ICD-10-CM | POA: Diagnosis not present

## 2017-10-30 DIAGNOSIS — M6281 Muscle weakness (generalized): Secondary | ICD-10-CM | POA: Diagnosis not present

## 2017-10-30 DIAGNOSIS — G309 Alzheimer's disease, unspecified: Secondary | ICD-10-CM | POA: Diagnosis not present

## 2017-10-30 DIAGNOSIS — R278 Other lack of coordination: Secondary | ICD-10-CM | POA: Diagnosis not present

## 2017-10-30 DIAGNOSIS — K219 Gastro-esophageal reflux disease without esophagitis: Secondary | ICD-10-CM | POA: Diagnosis not present

## 2017-10-30 DIAGNOSIS — R633 Feeding difficulties: Secondary | ICD-10-CM | POA: Diagnosis not present

## 2017-10-30 DIAGNOSIS — R2689 Other abnormalities of gait and mobility: Secondary | ICD-10-CM | POA: Diagnosis not present

## 2017-10-30 DIAGNOSIS — M19019 Primary osteoarthritis, unspecified shoulder: Secondary | ICD-10-CM | POA: Diagnosis not present

## 2017-10-30 DIAGNOSIS — E785 Hyperlipidemia, unspecified: Secondary | ICD-10-CM | POA: Diagnosis not present

## 2017-10-30 DIAGNOSIS — E119 Type 2 diabetes mellitus without complications: Secondary | ICD-10-CM | POA: Diagnosis not present

## 2017-10-30 DIAGNOSIS — I10 Essential (primary) hypertension: Secondary | ICD-10-CM | POA: Diagnosis not present

## 2017-11-01 DIAGNOSIS — R278 Other lack of coordination: Secondary | ICD-10-CM | POA: Diagnosis not present

## 2017-11-01 DIAGNOSIS — G309 Alzheimer's disease, unspecified: Secondary | ICD-10-CM | POA: Diagnosis not present

## 2017-11-01 DIAGNOSIS — R1311 Dysphagia, oral phase: Secondary | ICD-10-CM | POA: Diagnosis not present

## 2017-11-01 DIAGNOSIS — M6281 Muscle weakness (generalized): Secondary | ICD-10-CM | POA: Diagnosis not present

## 2017-11-01 DIAGNOSIS — I10 Essential (primary) hypertension: Secondary | ICD-10-CM | POA: Diagnosis not present

## 2017-11-01 DIAGNOSIS — E785 Hyperlipidemia, unspecified: Secondary | ICD-10-CM | POA: Diagnosis not present

## 2017-11-01 DIAGNOSIS — E119 Type 2 diabetes mellitus without complications: Secondary | ICD-10-CM | POA: Diagnosis not present

## 2017-11-01 DIAGNOSIS — R262 Difficulty in walking, not elsewhere classified: Secondary | ICD-10-CM | POA: Diagnosis not present

## 2017-11-01 DIAGNOSIS — R633 Feeding difficulties: Secondary | ICD-10-CM | POA: Diagnosis not present

## 2017-11-01 DIAGNOSIS — K219 Gastro-esophageal reflux disease without esophagitis: Secondary | ICD-10-CM | POA: Diagnosis not present

## 2017-11-01 DIAGNOSIS — R2689 Other abnormalities of gait and mobility: Secondary | ICD-10-CM | POA: Diagnosis not present

## 2017-11-01 DIAGNOSIS — M19019 Primary osteoarthritis, unspecified shoulder: Secondary | ICD-10-CM | POA: Diagnosis not present

## 2017-11-02 DIAGNOSIS — K219 Gastro-esophageal reflux disease without esophagitis: Secondary | ICD-10-CM | POA: Diagnosis not present

## 2017-11-02 DIAGNOSIS — M19019 Primary osteoarthritis, unspecified shoulder: Secondary | ICD-10-CM | POA: Diagnosis not present

## 2017-11-02 DIAGNOSIS — R633 Feeding difficulties: Secondary | ICD-10-CM | POA: Diagnosis not present

## 2017-11-02 DIAGNOSIS — R1311 Dysphagia, oral phase: Secondary | ICD-10-CM | POA: Diagnosis not present

## 2017-11-02 DIAGNOSIS — E119 Type 2 diabetes mellitus without complications: Secondary | ICD-10-CM | POA: Diagnosis not present

## 2017-11-02 DIAGNOSIS — G309 Alzheimer's disease, unspecified: Secondary | ICD-10-CM | POA: Diagnosis not present

## 2017-11-02 DIAGNOSIS — E785 Hyperlipidemia, unspecified: Secondary | ICD-10-CM | POA: Diagnosis not present

## 2017-11-02 DIAGNOSIS — R262 Difficulty in walking, not elsewhere classified: Secondary | ICD-10-CM | POA: Diagnosis not present

## 2017-11-02 DIAGNOSIS — R278 Other lack of coordination: Secondary | ICD-10-CM | POA: Diagnosis not present

## 2017-11-02 DIAGNOSIS — I10 Essential (primary) hypertension: Secondary | ICD-10-CM | POA: Diagnosis not present

## 2017-11-02 DIAGNOSIS — R2689 Other abnormalities of gait and mobility: Secondary | ICD-10-CM | POA: Diagnosis not present

## 2017-11-02 DIAGNOSIS — M6281 Muscle weakness (generalized): Secondary | ICD-10-CM | POA: Diagnosis not present

## 2017-11-13 DIAGNOSIS — E46 Unspecified protein-calorie malnutrition: Secondary | ICD-10-CM | POA: Diagnosis not present

## 2017-11-13 DIAGNOSIS — I10 Essential (primary) hypertension: Secondary | ICD-10-CM | POA: Diagnosis not present

## 2017-11-13 DIAGNOSIS — I639 Cerebral infarction, unspecified: Secondary | ICD-10-CM | POA: Diagnosis not present

## 2017-12-08 DIAGNOSIS — I10 Essential (primary) hypertension: Secondary | ICD-10-CM | POA: Diagnosis not present

## 2017-12-08 DIAGNOSIS — E785 Hyperlipidemia, unspecified: Secondary | ICD-10-CM | POA: Diagnosis not present

## 2017-12-08 DIAGNOSIS — E119 Type 2 diabetes mellitus without complications: Secondary | ICD-10-CM | POA: Diagnosis not present

## 2018-01-08 DIAGNOSIS — G8190 Hemiplegia, unspecified affecting unspecified side: Secondary | ICD-10-CM | POA: Diagnosis not present

## 2018-01-08 DIAGNOSIS — E46 Unspecified protein-calorie malnutrition: Secondary | ICD-10-CM | POA: Diagnosis not present

## 2018-01-08 DIAGNOSIS — I10 Essential (primary) hypertension: Secondary | ICD-10-CM | POA: Diagnosis not present

## 2018-01-08 DIAGNOSIS — I639 Cerebral infarction, unspecified: Secondary | ICD-10-CM | POA: Diagnosis not present

## 2018-01-13 DIAGNOSIS — R278 Other lack of coordination: Secondary | ICD-10-CM | POA: Diagnosis not present

## 2018-01-13 DIAGNOSIS — K219 Gastro-esophageal reflux disease without esophagitis: Secondary | ICD-10-CM | POA: Diagnosis not present

## 2018-01-13 DIAGNOSIS — E119 Type 2 diabetes mellitus without complications: Secondary | ICD-10-CM | POA: Diagnosis not present

## 2018-01-13 DIAGNOSIS — M6281 Muscle weakness (generalized): Secondary | ICD-10-CM | POA: Diagnosis not present

## 2018-01-13 DIAGNOSIS — R262 Difficulty in walking, not elsewhere classified: Secondary | ICD-10-CM | POA: Diagnosis not present

## 2018-01-13 DIAGNOSIS — E785 Hyperlipidemia, unspecified: Secondary | ICD-10-CM | POA: Diagnosis not present

## 2018-01-13 DIAGNOSIS — R633 Feeding difficulties: Secondary | ICD-10-CM | POA: Diagnosis not present

## 2018-01-13 DIAGNOSIS — G309 Alzheimer's disease, unspecified: Secondary | ICD-10-CM | POA: Diagnosis not present

## 2018-01-13 DIAGNOSIS — R1311 Dysphagia, oral phase: Secondary | ICD-10-CM | POA: Diagnosis not present

## 2018-01-13 DIAGNOSIS — R2689 Other abnormalities of gait and mobility: Secondary | ICD-10-CM | POA: Diagnosis not present

## 2018-01-13 DIAGNOSIS — I10 Essential (primary) hypertension: Secondary | ICD-10-CM | POA: Diagnosis not present

## 2018-01-13 DIAGNOSIS — M19019 Primary osteoarthritis, unspecified shoulder: Secondary | ICD-10-CM | POA: Diagnosis not present

## 2018-01-14 DIAGNOSIS — I10 Essential (primary) hypertension: Secondary | ICD-10-CM | POA: Diagnosis not present

## 2018-01-14 DIAGNOSIS — K219 Gastro-esophageal reflux disease without esophagitis: Secondary | ICD-10-CM | POA: Diagnosis not present

## 2018-01-14 DIAGNOSIS — R2689 Other abnormalities of gait and mobility: Secondary | ICD-10-CM | POA: Diagnosis not present

## 2018-01-14 DIAGNOSIS — E119 Type 2 diabetes mellitus without complications: Secondary | ICD-10-CM | POA: Diagnosis not present

## 2018-01-14 DIAGNOSIS — R633 Feeding difficulties: Secondary | ICD-10-CM | POA: Diagnosis not present

## 2018-01-14 DIAGNOSIS — E785 Hyperlipidemia, unspecified: Secondary | ICD-10-CM | POA: Diagnosis not present

## 2018-01-14 DIAGNOSIS — M19019 Primary osteoarthritis, unspecified shoulder: Secondary | ICD-10-CM | POA: Diagnosis not present

## 2018-01-14 DIAGNOSIS — R262 Difficulty in walking, not elsewhere classified: Secondary | ICD-10-CM | POA: Diagnosis not present

## 2018-01-14 DIAGNOSIS — R278 Other lack of coordination: Secondary | ICD-10-CM | POA: Diagnosis not present

## 2018-01-14 DIAGNOSIS — M6281 Muscle weakness (generalized): Secondary | ICD-10-CM | POA: Diagnosis not present

## 2018-01-14 DIAGNOSIS — R1311 Dysphagia, oral phase: Secondary | ICD-10-CM | POA: Diagnosis not present

## 2018-01-14 DIAGNOSIS — G309 Alzheimer's disease, unspecified: Secondary | ICD-10-CM | POA: Diagnosis not present

## 2018-01-15 DIAGNOSIS — R2689 Other abnormalities of gait and mobility: Secondary | ICD-10-CM | POA: Diagnosis not present

## 2018-01-15 DIAGNOSIS — R633 Feeding difficulties: Secondary | ICD-10-CM | POA: Diagnosis not present

## 2018-01-15 DIAGNOSIS — E785 Hyperlipidemia, unspecified: Secondary | ICD-10-CM | POA: Diagnosis not present

## 2018-01-15 DIAGNOSIS — M19019 Primary osteoarthritis, unspecified shoulder: Secondary | ICD-10-CM | POA: Diagnosis not present

## 2018-01-15 DIAGNOSIS — R278 Other lack of coordination: Secondary | ICD-10-CM | POA: Diagnosis not present

## 2018-01-15 DIAGNOSIS — I10 Essential (primary) hypertension: Secondary | ICD-10-CM | POA: Diagnosis not present

## 2018-01-15 DIAGNOSIS — G309 Alzheimer's disease, unspecified: Secondary | ICD-10-CM | POA: Diagnosis not present

## 2018-01-15 DIAGNOSIS — R262 Difficulty in walking, not elsewhere classified: Secondary | ICD-10-CM | POA: Diagnosis not present

## 2018-01-15 DIAGNOSIS — M6281 Muscle weakness (generalized): Secondary | ICD-10-CM | POA: Diagnosis not present

## 2018-01-15 DIAGNOSIS — E119 Type 2 diabetes mellitus without complications: Secondary | ICD-10-CM | POA: Diagnosis not present

## 2018-01-15 DIAGNOSIS — K219 Gastro-esophageal reflux disease without esophagitis: Secondary | ICD-10-CM | POA: Diagnosis not present

## 2018-01-15 DIAGNOSIS — R1311 Dysphagia, oral phase: Secondary | ICD-10-CM | POA: Diagnosis not present

## 2018-01-18 DIAGNOSIS — E785 Hyperlipidemia, unspecified: Secondary | ICD-10-CM | POA: Diagnosis not present

## 2018-01-18 DIAGNOSIS — K219 Gastro-esophageal reflux disease without esophagitis: Secondary | ICD-10-CM | POA: Diagnosis not present

## 2018-01-18 DIAGNOSIS — R262 Difficulty in walking, not elsewhere classified: Secondary | ICD-10-CM | POA: Diagnosis not present

## 2018-01-18 DIAGNOSIS — M19019 Primary osteoarthritis, unspecified shoulder: Secondary | ICD-10-CM | POA: Diagnosis not present

## 2018-01-18 DIAGNOSIS — R1311 Dysphagia, oral phase: Secondary | ICD-10-CM | POA: Diagnosis not present

## 2018-01-18 DIAGNOSIS — G309 Alzheimer's disease, unspecified: Secondary | ICD-10-CM | POA: Diagnosis not present

## 2018-01-18 DIAGNOSIS — R278 Other lack of coordination: Secondary | ICD-10-CM | POA: Diagnosis not present

## 2018-01-18 DIAGNOSIS — R2689 Other abnormalities of gait and mobility: Secondary | ICD-10-CM | POA: Diagnosis not present

## 2018-01-18 DIAGNOSIS — R633 Feeding difficulties: Secondary | ICD-10-CM | POA: Diagnosis not present

## 2018-01-18 DIAGNOSIS — E119 Type 2 diabetes mellitus without complications: Secondary | ICD-10-CM | POA: Diagnosis not present

## 2018-01-18 DIAGNOSIS — M6281 Muscle weakness (generalized): Secondary | ICD-10-CM | POA: Diagnosis not present

## 2018-01-18 DIAGNOSIS — I10 Essential (primary) hypertension: Secondary | ICD-10-CM | POA: Diagnosis not present

## 2018-01-19 DIAGNOSIS — E119 Type 2 diabetes mellitus without complications: Secondary | ICD-10-CM | POA: Diagnosis not present

## 2018-01-19 DIAGNOSIS — E785 Hyperlipidemia, unspecified: Secondary | ICD-10-CM | POA: Diagnosis not present

## 2018-01-19 DIAGNOSIS — R262 Difficulty in walking, not elsewhere classified: Secondary | ICD-10-CM | POA: Diagnosis not present

## 2018-01-19 DIAGNOSIS — M6281 Muscle weakness (generalized): Secondary | ICD-10-CM | POA: Diagnosis not present

## 2018-01-19 DIAGNOSIS — I10 Essential (primary) hypertension: Secondary | ICD-10-CM | POA: Diagnosis not present

## 2018-01-19 DIAGNOSIS — R278 Other lack of coordination: Secondary | ICD-10-CM | POA: Diagnosis not present

## 2018-01-19 DIAGNOSIS — R1311 Dysphagia, oral phase: Secondary | ICD-10-CM | POA: Diagnosis not present

## 2018-01-19 DIAGNOSIS — K219 Gastro-esophageal reflux disease without esophagitis: Secondary | ICD-10-CM | POA: Diagnosis not present

## 2018-01-19 DIAGNOSIS — R633 Feeding difficulties: Secondary | ICD-10-CM | POA: Diagnosis not present

## 2018-01-19 DIAGNOSIS — M19019 Primary osteoarthritis, unspecified shoulder: Secondary | ICD-10-CM | POA: Diagnosis not present

## 2018-01-19 DIAGNOSIS — R2689 Other abnormalities of gait and mobility: Secondary | ICD-10-CM | POA: Diagnosis not present

## 2018-01-19 DIAGNOSIS — G309 Alzheimer's disease, unspecified: Secondary | ICD-10-CM | POA: Diagnosis not present

## 2018-01-20 DIAGNOSIS — M6281 Muscle weakness (generalized): Secondary | ICD-10-CM | POA: Diagnosis not present

## 2018-01-20 DIAGNOSIS — E785 Hyperlipidemia, unspecified: Secondary | ICD-10-CM | POA: Diagnosis not present

## 2018-01-20 DIAGNOSIS — R2689 Other abnormalities of gait and mobility: Secondary | ICD-10-CM | POA: Diagnosis not present

## 2018-01-20 DIAGNOSIS — R633 Feeding difficulties: Secondary | ICD-10-CM | POA: Diagnosis not present

## 2018-01-20 DIAGNOSIS — K219 Gastro-esophageal reflux disease without esophagitis: Secondary | ICD-10-CM | POA: Diagnosis not present

## 2018-01-20 DIAGNOSIS — R262 Difficulty in walking, not elsewhere classified: Secondary | ICD-10-CM | POA: Diagnosis not present

## 2018-01-20 DIAGNOSIS — E119 Type 2 diabetes mellitus without complications: Secondary | ICD-10-CM | POA: Diagnosis not present

## 2018-01-20 DIAGNOSIS — R278 Other lack of coordination: Secondary | ICD-10-CM | POA: Diagnosis not present

## 2018-01-20 DIAGNOSIS — R1311 Dysphagia, oral phase: Secondary | ICD-10-CM | POA: Diagnosis not present

## 2018-01-20 DIAGNOSIS — M19019 Primary osteoarthritis, unspecified shoulder: Secondary | ICD-10-CM | POA: Diagnosis not present

## 2018-01-20 DIAGNOSIS — G309 Alzheimer's disease, unspecified: Secondary | ICD-10-CM | POA: Diagnosis not present

## 2018-01-20 DIAGNOSIS — I10 Essential (primary) hypertension: Secondary | ICD-10-CM | POA: Diagnosis not present

## 2018-01-21 DIAGNOSIS — K219 Gastro-esophageal reflux disease without esophagitis: Secondary | ICD-10-CM | POA: Diagnosis not present

## 2018-01-21 DIAGNOSIS — I10 Essential (primary) hypertension: Secondary | ICD-10-CM | POA: Diagnosis not present

## 2018-01-21 DIAGNOSIS — R2689 Other abnormalities of gait and mobility: Secondary | ICD-10-CM | POA: Diagnosis not present

## 2018-01-21 DIAGNOSIS — R278 Other lack of coordination: Secondary | ICD-10-CM | POA: Diagnosis not present

## 2018-01-21 DIAGNOSIS — M19019 Primary osteoarthritis, unspecified shoulder: Secondary | ICD-10-CM | POA: Diagnosis not present

## 2018-01-21 DIAGNOSIS — E119 Type 2 diabetes mellitus without complications: Secondary | ICD-10-CM | POA: Diagnosis not present

## 2018-01-21 DIAGNOSIS — G309 Alzheimer's disease, unspecified: Secondary | ICD-10-CM | POA: Diagnosis not present

## 2018-01-21 DIAGNOSIS — M6281 Muscle weakness (generalized): Secondary | ICD-10-CM | POA: Diagnosis not present

## 2018-01-21 DIAGNOSIS — R262 Difficulty in walking, not elsewhere classified: Secondary | ICD-10-CM | POA: Diagnosis not present

## 2018-01-21 DIAGNOSIS — R633 Feeding difficulties: Secondary | ICD-10-CM | POA: Diagnosis not present

## 2018-01-21 DIAGNOSIS — E785 Hyperlipidemia, unspecified: Secondary | ICD-10-CM | POA: Diagnosis not present

## 2018-01-21 DIAGNOSIS — R1311 Dysphagia, oral phase: Secondary | ICD-10-CM | POA: Diagnosis not present

## 2018-01-22 DIAGNOSIS — I10 Essential (primary) hypertension: Secondary | ICD-10-CM | POA: Diagnosis not present

## 2018-01-22 DIAGNOSIS — E785 Hyperlipidemia, unspecified: Secondary | ICD-10-CM | POA: Diagnosis not present

## 2018-01-22 DIAGNOSIS — M19019 Primary osteoarthritis, unspecified shoulder: Secondary | ICD-10-CM | POA: Diagnosis not present

## 2018-01-22 DIAGNOSIS — E119 Type 2 diabetes mellitus without complications: Secondary | ICD-10-CM | POA: Diagnosis not present

## 2018-01-22 DIAGNOSIS — M6281 Muscle weakness (generalized): Secondary | ICD-10-CM | POA: Diagnosis not present

## 2018-01-22 DIAGNOSIS — R2689 Other abnormalities of gait and mobility: Secondary | ICD-10-CM | POA: Diagnosis not present

## 2018-01-22 DIAGNOSIS — R633 Feeding difficulties: Secondary | ICD-10-CM | POA: Diagnosis not present

## 2018-01-22 DIAGNOSIS — R278 Other lack of coordination: Secondary | ICD-10-CM | POA: Diagnosis not present

## 2018-01-22 DIAGNOSIS — R262 Difficulty in walking, not elsewhere classified: Secondary | ICD-10-CM | POA: Diagnosis not present

## 2018-01-22 DIAGNOSIS — R1311 Dysphagia, oral phase: Secondary | ICD-10-CM | POA: Diagnosis not present

## 2018-01-22 DIAGNOSIS — K219 Gastro-esophageal reflux disease without esophagitis: Secondary | ICD-10-CM | POA: Diagnosis not present

## 2018-01-22 DIAGNOSIS — G309 Alzheimer's disease, unspecified: Secondary | ICD-10-CM | POA: Diagnosis not present

## 2018-01-25 DIAGNOSIS — M19019 Primary osteoarthritis, unspecified shoulder: Secondary | ICD-10-CM | POA: Diagnosis not present

## 2018-01-25 DIAGNOSIS — R1311 Dysphagia, oral phase: Secondary | ICD-10-CM | POA: Diagnosis not present

## 2018-01-25 DIAGNOSIS — G309 Alzheimer's disease, unspecified: Secondary | ICD-10-CM | POA: Diagnosis not present

## 2018-01-25 DIAGNOSIS — K219 Gastro-esophageal reflux disease without esophagitis: Secondary | ICD-10-CM | POA: Diagnosis not present

## 2018-01-25 DIAGNOSIS — M6281 Muscle weakness (generalized): Secondary | ICD-10-CM | POA: Diagnosis not present

## 2018-01-25 DIAGNOSIS — I10 Essential (primary) hypertension: Secondary | ICD-10-CM | POA: Diagnosis not present

## 2018-01-25 DIAGNOSIS — R262 Difficulty in walking, not elsewhere classified: Secondary | ICD-10-CM | POA: Diagnosis not present

## 2018-01-25 DIAGNOSIS — E785 Hyperlipidemia, unspecified: Secondary | ICD-10-CM | POA: Diagnosis not present

## 2018-01-25 DIAGNOSIS — R278 Other lack of coordination: Secondary | ICD-10-CM | POA: Diagnosis not present

## 2018-01-25 DIAGNOSIS — E119 Type 2 diabetes mellitus without complications: Secondary | ICD-10-CM | POA: Diagnosis not present

## 2018-01-25 DIAGNOSIS — R2689 Other abnormalities of gait and mobility: Secondary | ICD-10-CM | POA: Diagnosis not present

## 2018-01-25 DIAGNOSIS — R633 Feeding difficulties: Secondary | ICD-10-CM | POA: Diagnosis not present

## 2018-01-26 DIAGNOSIS — R1311 Dysphagia, oral phase: Secondary | ICD-10-CM | POA: Diagnosis not present

## 2018-01-26 DIAGNOSIS — I10 Essential (primary) hypertension: Secondary | ICD-10-CM | POA: Diagnosis not present

## 2018-01-26 DIAGNOSIS — M6281 Muscle weakness (generalized): Secondary | ICD-10-CM | POA: Diagnosis not present

## 2018-01-26 DIAGNOSIS — R262 Difficulty in walking, not elsewhere classified: Secondary | ICD-10-CM | POA: Diagnosis not present

## 2018-01-26 DIAGNOSIS — E785 Hyperlipidemia, unspecified: Secondary | ICD-10-CM | POA: Diagnosis not present

## 2018-01-26 DIAGNOSIS — R278 Other lack of coordination: Secondary | ICD-10-CM | POA: Diagnosis not present

## 2018-01-26 DIAGNOSIS — E119 Type 2 diabetes mellitus without complications: Secondary | ICD-10-CM | POA: Diagnosis not present

## 2018-01-26 DIAGNOSIS — M19019 Primary osteoarthritis, unspecified shoulder: Secondary | ICD-10-CM | POA: Diagnosis not present

## 2018-01-26 DIAGNOSIS — R2689 Other abnormalities of gait and mobility: Secondary | ICD-10-CM | POA: Diagnosis not present

## 2018-01-26 DIAGNOSIS — R633 Feeding difficulties: Secondary | ICD-10-CM | POA: Diagnosis not present

## 2018-01-26 DIAGNOSIS — K219 Gastro-esophageal reflux disease without esophagitis: Secondary | ICD-10-CM | POA: Diagnosis not present

## 2018-01-26 DIAGNOSIS — G309 Alzheimer's disease, unspecified: Secondary | ICD-10-CM | POA: Diagnosis not present

## 2018-01-27 DIAGNOSIS — M19019 Primary osteoarthritis, unspecified shoulder: Secondary | ICD-10-CM | POA: Diagnosis not present

## 2018-01-27 DIAGNOSIS — G309 Alzheimer's disease, unspecified: Secondary | ICD-10-CM | POA: Diagnosis not present

## 2018-01-27 DIAGNOSIS — R262 Difficulty in walking, not elsewhere classified: Secondary | ICD-10-CM | POA: Diagnosis not present

## 2018-01-27 DIAGNOSIS — R21 Rash and other nonspecific skin eruption: Secondary | ICD-10-CM | POA: Diagnosis not present

## 2018-01-27 DIAGNOSIS — E785 Hyperlipidemia, unspecified: Secondary | ICD-10-CM | POA: Diagnosis not present

## 2018-01-27 DIAGNOSIS — R1311 Dysphagia, oral phase: Secondary | ICD-10-CM | POA: Diagnosis not present

## 2018-01-27 DIAGNOSIS — R2689 Other abnormalities of gait and mobility: Secondary | ICD-10-CM | POA: Diagnosis not present

## 2018-01-27 DIAGNOSIS — R633 Feeding difficulties: Secondary | ICD-10-CM | POA: Diagnosis not present

## 2018-01-27 DIAGNOSIS — M6281 Muscle weakness (generalized): Secondary | ICD-10-CM | POA: Diagnosis not present

## 2018-01-27 DIAGNOSIS — I10 Essential (primary) hypertension: Secondary | ICD-10-CM | POA: Diagnosis not present

## 2018-01-27 DIAGNOSIS — R278 Other lack of coordination: Secondary | ICD-10-CM | POA: Diagnosis not present

## 2018-01-27 DIAGNOSIS — E119 Type 2 diabetes mellitus without complications: Secondary | ICD-10-CM | POA: Diagnosis not present

## 2018-01-27 DIAGNOSIS — K219 Gastro-esophageal reflux disease without esophagitis: Secondary | ICD-10-CM | POA: Diagnosis not present

## 2018-01-28 DIAGNOSIS — M19019 Primary osteoarthritis, unspecified shoulder: Secondary | ICD-10-CM | POA: Diagnosis not present

## 2018-01-28 DIAGNOSIS — G309 Alzheimer's disease, unspecified: Secondary | ICD-10-CM | POA: Diagnosis not present

## 2018-01-28 DIAGNOSIS — R633 Feeding difficulties: Secondary | ICD-10-CM | POA: Diagnosis not present

## 2018-01-28 DIAGNOSIS — R278 Other lack of coordination: Secondary | ICD-10-CM | POA: Diagnosis not present

## 2018-01-28 DIAGNOSIS — R1311 Dysphagia, oral phase: Secondary | ICD-10-CM | POA: Diagnosis not present

## 2018-01-28 DIAGNOSIS — K219 Gastro-esophageal reflux disease without esophagitis: Secondary | ICD-10-CM | POA: Diagnosis not present

## 2018-01-28 DIAGNOSIS — E785 Hyperlipidemia, unspecified: Secondary | ICD-10-CM | POA: Diagnosis not present

## 2018-01-28 DIAGNOSIS — I10 Essential (primary) hypertension: Secondary | ICD-10-CM | POA: Diagnosis not present

## 2018-01-28 DIAGNOSIS — M6281 Muscle weakness (generalized): Secondary | ICD-10-CM | POA: Diagnosis not present

## 2018-01-28 DIAGNOSIS — R262 Difficulty in walking, not elsewhere classified: Secondary | ICD-10-CM | POA: Diagnosis not present

## 2018-01-28 DIAGNOSIS — R2689 Other abnormalities of gait and mobility: Secondary | ICD-10-CM | POA: Diagnosis not present

## 2018-01-28 DIAGNOSIS — E119 Type 2 diabetes mellitus without complications: Secondary | ICD-10-CM | POA: Diagnosis not present

## 2018-01-29 DIAGNOSIS — E785 Hyperlipidemia, unspecified: Secondary | ICD-10-CM | POA: Diagnosis not present

## 2018-01-29 DIAGNOSIS — R278 Other lack of coordination: Secondary | ICD-10-CM | POA: Diagnosis not present

## 2018-01-29 DIAGNOSIS — R2689 Other abnormalities of gait and mobility: Secondary | ICD-10-CM | POA: Diagnosis not present

## 2018-01-29 DIAGNOSIS — R262 Difficulty in walking, not elsewhere classified: Secondary | ICD-10-CM | POA: Diagnosis not present

## 2018-01-29 DIAGNOSIS — G309 Alzheimer's disease, unspecified: Secondary | ICD-10-CM | POA: Diagnosis not present

## 2018-01-29 DIAGNOSIS — I10 Essential (primary) hypertension: Secondary | ICD-10-CM | POA: Diagnosis not present

## 2018-01-29 DIAGNOSIS — K219 Gastro-esophageal reflux disease without esophagitis: Secondary | ICD-10-CM | POA: Diagnosis not present

## 2018-01-29 DIAGNOSIS — R633 Feeding difficulties: Secondary | ICD-10-CM | POA: Diagnosis not present

## 2018-01-29 DIAGNOSIS — M6281 Muscle weakness (generalized): Secondary | ICD-10-CM | POA: Diagnosis not present

## 2018-01-29 DIAGNOSIS — E119 Type 2 diabetes mellitus without complications: Secondary | ICD-10-CM | POA: Diagnosis not present

## 2018-01-29 DIAGNOSIS — M19019 Primary osteoarthritis, unspecified shoulder: Secondary | ICD-10-CM | POA: Diagnosis not present

## 2018-01-29 DIAGNOSIS — R1311 Dysphagia, oral phase: Secondary | ICD-10-CM | POA: Diagnosis not present

## 2018-02-01 DIAGNOSIS — R1311 Dysphagia, oral phase: Secondary | ICD-10-CM | POA: Diagnosis not present

## 2018-02-01 DIAGNOSIS — R278 Other lack of coordination: Secondary | ICD-10-CM | POA: Diagnosis not present

## 2018-02-01 DIAGNOSIS — R2689 Other abnormalities of gait and mobility: Secondary | ICD-10-CM | POA: Diagnosis not present

## 2018-02-01 DIAGNOSIS — R633 Feeding difficulties: Secondary | ICD-10-CM | POA: Diagnosis not present

## 2018-02-01 DIAGNOSIS — G309 Alzheimer's disease, unspecified: Secondary | ICD-10-CM | POA: Diagnosis not present

## 2018-02-01 DIAGNOSIS — E119 Type 2 diabetes mellitus without complications: Secondary | ICD-10-CM | POA: Diagnosis not present

## 2018-02-01 DIAGNOSIS — M19019 Primary osteoarthritis, unspecified shoulder: Secondary | ICD-10-CM | POA: Diagnosis not present

## 2018-02-01 DIAGNOSIS — E785 Hyperlipidemia, unspecified: Secondary | ICD-10-CM | POA: Diagnosis not present

## 2018-02-01 DIAGNOSIS — R262 Difficulty in walking, not elsewhere classified: Secondary | ICD-10-CM | POA: Diagnosis not present

## 2018-02-01 DIAGNOSIS — M6281 Muscle weakness (generalized): Secondary | ICD-10-CM | POA: Diagnosis not present

## 2018-02-01 DIAGNOSIS — I10 Essential (primary) hypertension: Secondary | ICD-10-CM | POA: Diagnosis not present

## 2018-02-01 DIAGNOSIS — K219 Gastro-esophageal reflux disease without esophagitis: Secondary | ICD-10-CM | POA: Diagnosis not present

## 2018-02-02 DIAGNOSIS — R633 Feeding difficulties: Secondary | ICD-10-CM | POA: Diagnosis not present

## 2018-02-02 DIAGNOSIS — R2689 Other abnormalities of gait and mobility: Secondary | ICD-10-CM | POA: Diagnosis not present

## 2018-02-02 DIAGNOSIS — I10 Essential (primary) hypertension: Secondary | ICD-10-CM | POA: Diagnosis not present

## 2018-02-02 DIAGNOSIS — M19019 Primary osteoarthritis, unspecified shoulder: Secondary | ICD-10-CM | POA: Diagnosis not present

## 2018-02-02 DIAGNOSIS — R278 Other lack of coordination: Secondary | ICD-10-CM | POA: Diagnosis not present

## 2018-02-02 DIAGNOSIS — R262 Difficulty in walking, not elsewhere classified: Secondary | ICD-10-CM | POA: Diagnosis not present

## 2018-02-02 DIAGNOSIS — R1311 Dysphagia, oral phase: Secondary | ICD-10-CM | POA: Diagnosis not present

## 2018-02-02 DIAGNOSIS — E785 Hyperlipidemia, unspecified: Secondary | ICD-10-CM | POA: Diagnosis not present

## 2018-02-02 DIAGNOSIS — K219 Gastro-esophageal reflux disease without esophagitis: Secondary | ICD-10-CM | POA: Diagnosis not present

## 2018-02-02 DIAGNOSIS — E119 Type 2 diabetes mellitus without complications: Secondary | ICD-10-CM | POA: Diagnosis not present

## 2018-02-02 DIAGNOSIS — G309 Alzheimer's disease, unspecified: Secondary | ICD-10-CM | POA: Diagnosis not present

## 2018-02-02 DIAGNOSIS — M6281 Muscle weakness (generalized): Secondary | ICD-10-CM | POA: Diagnosis not present

## 2018-02-03 DIAGNOSIS — R262 Difficulty in walking, not elsewhere classified: Secondary | ICD-10-CM | POA: Diagnosis not present

## 2018-02-03 DIAGNOSIS — E119 Type 2 diabetes mellitus without complications: Secondary | ICD-10-CM | POA: Diagnosis not present

## 2018-02-03 DIAGNOSIS — E785 Hyperlipidemia, unspecified: Secondary | ICD-10-CM | POA: Diagnosis not present

## 2018-02-03 DIAGNOSIS — R1311 Dysphagia, oral phase: Secondary | ICD-10-CM | POA: Diagnosis not present

## 2018-02-03 DIAGNOSIS — R278 Other lack of coordination: Secondary | ICD-10-CM | POA: Diagnosis not present

## 2018-02-03 DIAGNOSIS — G309 Alzheimer's disease, unspecified: Secondary | ICD-10-CM | POA: Diagnosis not present

## 2018-02-03 DIAGNOSIS — M6281 Muscle weakness (generalized): Secondary | ICD-10-CM | POA: Diagnosis not present

## 2018-02-03 DIAGNOSIS — M19019 Primary osteoarthritis, unspecified shoulder: Secondary | ICD-10-CM | POA: Diagnosis not present

## 2018-02-03 DIAGNOSIS — R2689 Other abnormalities of gait and mobility: Secondary | ICD-10-CM | POA: Diagnosis not present

## 2018-02-03 DIAGNOSIS — K219 Gastro-esophageal reflux disease without esophagitis: Secondary | ICD-10-CM | POA: Diagnosis not present

## 2018-02-03 DIAGNOSIS — R633 Feeding difficulties: Secondary | ICD-10-CM | POA: Diagnosis not present

## 2018-02-03 DIAGNOSIS — I10 Essential (primary) hypertension: Secondary | ICD-10-CM | POA: Diagnosis not present

## 2018-02-04 DIAGNOSIS — M6281 Muscle weakness (generalized): Secondary | ICD-10-CM | POA: Diagnosis not present

## 2018-02-04 DIAGNOSIS — R633 Feeding difficulties: Secondary | ICD-10-CM | POA: Diagnosis not present

## 2018-02-04 DIAGNOSIS — M19019 Primary osteoarthritis, unspecified shoulder: Secondary | ICD-10-CM | POA: Diagnosis not present

## 2018-02-04 DIAGNOSIS — K219 Gastro-esophageal reflux disease without esophagitis: Secondary | ICD-10-CM | POA: Diagnosis not present

## 2018-02-04 DIAGNOSIS — G309 Alzheimer's disease, unspecified: Secondary | ICD-10-CM | POA: Diagnosis not present

## 2018-02-04 DIAGNOSIS — I10 Essential (primary) hypertension: Secondary | ICD-10-CM | POA: Diagnosis not present

## 2018-02-04 DIAGNOSIS — R1311 Dysphagia, oral phase: Secondary | ICD-10-CM | POA: Diagnosis not present

## 2018-02-04 DIAGNOSIS — R278 Other lack of coordination: Secondary | ICD-10-CM | POA: Diagnosis not present

## 2018-02-04 DIAGNOSIS — E119 Type 2 diabetes mellitus without complications: Secondary | ICD-10-CM | POA: Diagnosis not present

## 2018-02-04 DIAGNOSIS — R2689 Other abnormalities of gait and mobility: Secondary | ICD-10-CM | POA: Diagnosis not present

## 2018-02-04 DIAGNOSIS — R262 Difficulty in walking, not elsewhere classified: Secondary | ICD-10-CM | POA: Diagnosis not present

## 2018-02-04 DIAGNOSIS — E785 Hyperlipidemia, unspecified: Secondary | ICD-10-CM | POA: Diagnosis not present

## 2018-02-05 DIAGNOSIS — I10 Essential (primary) hypertension: Secondary | ICD-10-CM | POA: Diagnosis not present

## 2018-02-05 DIAGNOSIS — E785 Hyperlipidemia, unspecified: Secondary | ICD-10-CM | POA: Diagnosis not present

## 2018-02-05 DIAGNOSIS — M19019 Primary osteoarthritis, unspecified shoulder: Secondary | ICD-10-CM | POA: Diagnosis not present

## 2018-02-05 DIAGNOSIS — R278 Other lack of coordination: Secondary | ICD-10-CM | POA: Diagnosis not present

## 2018-02-05 DIAGNOSIS — K219 Gastro-esophageal reflux disease without esophagitis: Secondary | ICD-10-CM | POA: Diagnosis not present

## 2018-02-05 DIAGNOSIS — G309 Alzheimer's disease, unspecified: Secondary | ICD-10-CM | POA: Diagnosis not present

## 2018-02-05 DIAGNOSIS — M6281 Muscle weakness (generalized): Secondary | ICD-10-CM | POA: Diagnosis not present

## 2018-02-05 DIAGNOSIS — R2689 Other abnormalities of gait and mobility: Secondary | ICD-10-CM | POA: Diagnosis not present

## 2018-02-05 DIAGNOSIS — R1311 Dysphagia, oral phase: Secondary | ICD-10-CM | POA: Diagnosis not present

## 2018-02-05 DIAGNOSIS — R262 Difficulty in walking, not elsewhere classified: Secondary | ICD-10-CM | POA: Diagnosis not present

## 2018-02-05 DIAGNOSIS — E119 Type 2 diabetes mellitus without complications: Secondary | ICD-10-CM | POA: Diagnosis not present

## 2018-02-05 DIAGNOSIS — R633 Feeding difficulties: Secondary | ICD-10-CM | POA: Diagnosis not present

## 2018-02-08 DIAGNOSIS — M19019 Primary osteoarthritis, unspecified shoulder: Secondary | ICD-10-CM | POA: Diagnosis not present

## 2018-02-08 DIAGNOSIS — R262 Difficulty in walking, not elsewhere classified: Secondary | ICD-10-CM | POA: Diagnosis not present

## 2018-02-08 DIAGNOSIS — R633 Feeding difficulties: Secondary | ICD-10-CM | POA: Diagnosis not present

## 2018-02-08 DIAGNOSIS — R1311 Dysphagia, oral phase: Secondary | ICD-10-CM | POA: Diagnosis not present

## 2018-02-08 DIAGNOSIS — G309 Alzheimer's disease, unspecified: Secondary | ICD-10-CM | POA: Diagnosis not present

## 2018-02-08 DIAGNOSIS — I10 Essential (primary) hypertension: Secondary | ICD-10-CM | POA: Diagnosis not present

## 2018-02-08 DIAGNOSIS — K219 Gastro-esophageal reflux disease without esophagitis: Secondary | ICD-10-CM | POA: Diagnosis not present

## 2018-02-08 DIAGNOSIS — R2689 Other abnormalities of gait and mobility: Secondary | ICD-10-CM | POA: Diagnosis not present

## 2018-02-08 DIAGNOSIS — R278 Other lack of coordination: Secondary | ICD-10-CM | POA: Diagnosis not present

## 2018-02-08 DIAGNOSIS — E119 Type 2 diabetes mellitus without complications: Secondary | ICD-10-CM | POA: Diagnosis not present

## 2018-02-08 DIAGNOSIS — M6281 Muscle weakness (generalized): Secondary | ICD-10-CM | POA: Diagnosis not present

## 2018-02-08 DIAGNOSIS — E785 Hyperlipidemia, unspecified: Secondary | ICD-10-CM | POA: Diagnosis not present

## 2018-02-09 DIAGNOSIS — E119 Type 2 diabetes mellitus without complications: Secondary | ICD-10-CM | POA: Diagnosis not present

## 2018-02-09 DIAGNOSIS — R633 Feeding difficulties: Secondary | ICD-10-CM | POA: Diagnosis not present

## 2018-02-09 DIAGNOSIS — M19019 Primary osteoarthritis, unspecified shoulder: Secondary | ICD-10-CM | POA: Diagnosis not present

## 2018-02-09 DIAGNOSIS — R2689 Other abnormalities of gait and mobility: Secondary | ICD-10-CM | POA: Diagnosis not present

## 2018-02-09 DIAGNOSIS — I10 Essential (primary) hypertension: Secondary | ICD-10-CM | POA: Diagnosis not present

## 2018-02-09 DIAGNOSIS — R278 Other lack of coordination: Secondary | ICD-10-CM | POA: Diagnosis not present

## 2018-02-09 DIAGNOSIS — E785 Hyperlipidemia, unspecified: Secondary | ICD-10-CM | POA: Diagnosis not present

## 2018-02-09 DIAGNOSIS — R262 Difficulty in walking, not elsewhere classified: Secondary | ICD-10-CM | POA: Diagnosis not present

## 2018-02-09 DIAGNOSIS — M6281 Muscle weakness (generalized): Secondary | ICD-10-CM | POA: Diagnosis not present

## 2018-02-09 DIAGNOSIS — R1311 Dysphagia, oral phase: Secondary | ICD-10-CM | POA: Diagnosis not present

## 2018-02-09 DIAGNOSIS — G309 Alzheimer's disease, unspecified: Secondary | ICD-10-CM | POA: Diagnosis not present

## 2018-02-09 DIAGNOSIS — K219 Gastro-esophageal reflux disease without esophagitis: Secondary | ICD-10-CM | POA: Diagnosis not present

## 2018-02-10 DIAGNOSIS — R633 Feeding difficulties: Secondary | ICD-10-CM | POA: Diagnosis not present

## 2018-02-10 DIAGNOSIS — R2689 Other abnormalities of gait and mobility: Secondary | ICD-10-CM | POA: Diagnosis not present

## 2018-02-10 DIAGNOSIS — R262 Difficulty in walking, not elsewhere classified: Secondary | ICD-10-CM | POA: Diagnosis not present

## 2018-02-10 DIAGNOSIS — M19019 Primary osteoarthritis, unspecified shoulder: Secondary | ICD-10-CM | POA: Diagnosis not present

## 2018-02-10 DIAGNOSIS — E119 Type 2 diabetes mellitus without complications: Secondary | ICD-10-CM | POA: Diagnosis not present

## 2018-02-10 DIAGNOSIS — G309 Alzheimer's disease, unspecified: Secondary | ICD-10-CM | POA: Diagnosis not present

## 2018-02-10 DIAGNOSIS — R1311 Dysphagia, oral phase: Secondary | ICD-10-CM | POA: Diagnosis not present

## 2018-02-10 DIAGNOSIS — E785 Hyperlipidemia, unspecified: Secondary | ICD-10-CM | POA: Diagnosis not present

## 2018-02-10 DIAGNOSIS — R278 Other lack of coordination: Secondary | ICD-10-CM | POA: Diagnosis not present

## 2018-02-10 DIAGNOSIS — K219 Gastro-esophageal reflux disease without esophagitis: Secondary | ICD-10-CM | POA: Diagnosis not present

## 2018-02-10 DIAGNOSIS — I10 Essential (primary) hypertension: Secondary | ICD-10-CM | POA: Diagnosis not present

## 2018-02-10 DIAGNOSIS — M6281 Muscle weakness (generalized): Secondary | ICD-10-CM | POA: Diagnosis not present

## 2018-02-11 DIAGNOSIS — R633 Feeding difficulties: Secondary | ICD-10-CM | POA: Diagnosis not present

## 2018-02-11 DIAGNOSIS — R2689 Other abnormalities of gait and mobility: Secondary | ICD-10-CM | POA: Diagnosis not present

## 2018-02-11 DIAGNOSIS — R262 Difficulty in walking, not elsewhere classified: Secondary | ICD-10-CM | POA: Diagnosis not present

## 2018-02-11 DIAGNOSIS — E785 Hyperlipidemia, unspecified: Secondary | ICD-10-CM | POA: Diagnosis not present

## 2018-02-11 DIAGNOSIS — I10 Essential (primary) hypertension: Secondary | ICD-10-CM | POA: Diagnosis not present

## 2018-02-11 DIAGNOSIS — M19019 Primary osteoarthritis, unspecified shoulder: Secondary | ICD-10-CM | POA: Diagnosis not present

## 2018-02-11 DIAGNOSIS — K219 Gastro-esophageal reflux disease without esophagitis: Secondary | ICD-10-CM | POA: Diagnosis not present

## 2018-02-11 DIAGNOSIS — E119 Type 2 diabetes mellitus without complications: Secondary | ICD-10-CM | POA: Diagnosis not present

## 2018-02-11 DIAGNOSIS — R1311 Dysphagia, oral phase: Secondary | ICD-10-CM | POA: Diagnosis not present

## 2018-02-11 DIAGNOSIS — G309 Alzheimer's disease, unspecified: Secondary | ICD-10-CM | POA: Diagnosis not present

## 2018-02-11 DIAGNOSIS — M6281 Muscle weakness (generalized): Secondary | ICD-10-CM | POA: Diagnosis not present

## 2018-02-11 DIAGNOSIS — R278 Other lack of coordination: Secondary | ICD-10-CM | POA: Diagnosis not present

## 2018-03-12 DIAGNOSIS — E46 Unspecified protein-calorie malnutrition: Secondary | ICD-10-CM | POA: Diagnosis not present

## 2018-03-12 DIAGNOSIS — I10 Essential (primary) hypertension: Secondary | ICD-10-CM | POA: Diagnosis not present

## 2018-03-12 DIAGNOSIS — I639 Cerebral infarction, unspecified: Secondary | ICD-10-CM | POA: Diagnosis not present

## 2018-04-06 ENCOUNTER — Other Ambulatory Visit: Payer: Self-pay

## 2018-04-06 NOTE — Patient Outreach (Signed)
[facility]) Care Management  04/06/2018  KLOHE LOVERING Mar 05, 1941 [id number]    Medication Adherence call to Mrs : Daisa Stennis patient' telephone number is disconnected spoke with patient's daughter she said Mrs : Fritts is at a long term facility with dementia and the facility provides with all her medications : We have daughter's number [phone] : Mrs : Hillmann is due on Atorvastatin 40 mg under [employer].   Lillia Abed CPhT Pharmacy Technician [facility] Care Management Direct Dial [phone]  Fax [phone] [email or web address]

## 2018-04-08 DIAGNOSIS — M79641 Pain in right hand: Secondary | ICD-10-CM | POA: Diagnosis not present

## 2018-04-08 DIAGNOSIS — G309 Alzheimer's disease, unspecified: Secondary | ICD-10-CM | POA: Diagnosis not present

## 2018-04-08 DIAGNOSIS — M25531 Pain in right wrist: Secondary | ICD-10-CM | POA: Diagnosis not present

## 2018-04-08 DIAGNOSIS — E119 Type 2 diabetes mellitus without complications: Secondary | ICD-10-CM | POA: Diagnosis not present

## 2018-04-08 DIAGNOSIS — W19XXXD Unspecified fall, subsequent encounter: Secondary | ICD-10-CM | POA: Diagnosis not present

## 2018-05-13 DIAGNOSIS — I10 Essential (primary) hypertension: Secondary | ICD-10-CM | POA: Diagnosis not present

## 2018-05-13 DIAGNOSIS — E46 Unspecified protein-calorie malnutrition: Secondary | ICD-10-CM | POA: Diagnosis not present

## 2018-05-26 DIAGNOSIS — E559 Vitamin D deficiency, unspecified: Secondary | ICD-10-CM | POA: Diagnosis not present

## 2018-05-26 DIAGNOSIS — Z5181 Encounter for therapeutic drug level monitoring: Secondary | ICD-10-CM | POA: Diagnosis not present

## 2018-05-26 DIAGNOSIS — D649 Anemia, unspecified: Secondary | ICD-10-CM | POA: Diagnosis not present

## 2018-05-26 DIAGNOSIS — R569 Unspecified convulsions: Secondary | ICD-10-CM | POA: Diagnosis not present

## 2018-05-26 DIAGNOSIS — E785 Hyperlipidemia, unspecified: Secondary | ICD-10-CM | POA: Diagnosis not present

## 2018-05-26 DIAGNOSIS — I10 Essential (primary) hypertension: Secondary | ICD-10-CM | POA: Diagnosis not present

## 2018-05-26 DIAGNOSIS — E039 Hypothyroidism, unspecified: Secondary | ICD-10-CM | POA: Diagnosis not present

## 2018-05-26 DIAGNOSIS — Z79899 Other long term (current) drug therapy: Secondary | ICD-10-CM | POA: Diagnosis not present

## 2018-07-21 DIAGNOSIS — G309 Alzheimer's disease, unspecified: Secondary | ICD-10-CM | POA: Diagnosis not present

## 2018-07-21 DIAGNOSIS — R633 Feeding difficulties: Secondary | ICD-10-CM | POA: Diagnosis not present

## 2018-07-21 DIAGNOSIS — R278 Other lack of coordination: Secondary | ICD-10-CM | POA: Diagnosis not present

## 2018-07-21 DIAGNOSIS — E119 Type 2 diabetes mellitus without complications: Secondary | ICD-10-CM | POA: Diagnosis not present

## 2018-07-21 DIAGNOSIS — E785 Hyperlipidemia, unspecified: Secondary | ICD-10-CM | POA: Diagnosis not present

## 2018-07-21 DIAGNOSIS — R1311 Dysphagia, oral phase: Secondary | ICD-10-CM | POA: Diagnosis not present

## 2018-07-21 DIAGNOSIS — M6281 Muscle weakness (generalized): Secondary | ICD-10-CM | POA: Diagnosis not present

## 2018-07-21 DIAGNOSIS — R2689 Other abnormalities of gait and mobility: Secondary | ICD-10-CM | POA: Diagnosis not present

## 2018-07-21 DIAGNOSIS — K219 Gastro-esophageal reflux disease without esophagitis: Secondary | ICD-10-CM | POA: Diagnosis not present

## 2018-07-21 DIAGNOSIS — M19019 Primary osteoarthritis, unspecified shoulder: Secondary | ICD-10-CM | POA: Diagnosis not present

## 2018-07-21 DIAGNOSIS — I10 Essential (primary) hypertension: Secondary | ICD-10-CM | POA: Diagnosis not present

## 2018-07-21 DIAGNOSIS — R262 Difficulty in walking, not elsewhere classified: Secondary | ICD-10-CM | POA: Diagnosis not present

## 2018-07-22 DIAGNOSIS — R1311 Dysphagia, oral phase: Secondary | ICD-10-CM | POA: Diagnosis not present

## 2018-07-22 DIAGNOSIS — R633 Feeding difficulties: Secondary | ICD-10-CM | POA: Diagnosis not present

## 2018-07-22 DIAGNOSIS — R2689 Other abnormalities of gait and mobility: Secondary | ICD-10-CM | POA: Diagnosis not present

## 2018-07-22 DIAGNOSIS — M6281 Muscle weakness (generalized): Secondary | ICD-10-CM | POA: Diagnosis not present

## 2018-07-22 DIAGNOSIS — G309 Alzheimer's disease, unspecified: Secondary | ICD-10-CM | POA: Diagnosis not present

## 2018-07-22 DIAGNOSIS — R278 Other lack of coordination: Secondary | ICD-10-CM | POA: Diagnosis not present

## 2018-07-22 DIAGNOSIS — E119 Type 2 diabetes mellitus without complications: Secondary | ICD-10-CM | POA: Diagnosis not present

## 2018-07-22 DIAGNOSIS — R262 Difficulty in walking, not elsewhere classified: Secondary | ICD-10-CM | POA: Diagnosis not present

## 2018-07-22 DIAGNOSIS — K219 Gastro-esophageal reflux disease without esophagitis: Secondary | ICD-10-CM | POA: Diagnosis not present

## 2018-07-22 DIAGNOSIS — I10 Essential (primary) hypertension: Secondary | ICD-10-CM | POA: Diagnosis not present

## 2018-07-22 DIAGNOSIS — M19019 Primary osteoarthritis, unspecified shoulder: Secondary | ICD-10-CM | POA: Diagnosis not present

## 2018-07-22 DIAGNOSIS — E785 Hyperlipidemia, unspecified: Secondary | ICD-10-CM | POA: Diagnosis not present

## 2018-07-23 DIAGNOSIS — K219 Gastro-esophageal reflux disease without esophagitis: Secondary | ICD-10-CM | POA: Diagnosis not present

## 2018-07-23 DIAGNOSIS — E46 Unspecified protein-calorie malnutrition: Secondary | ICD-10-CM | POA: Diagnosis not present

## 2018-07-23 DIAGNOSIS — R633 Feeding difficulties: Secondary | ICD-10-CM | POA: Diagnosis not present

## 2018-07-23 DIAGNOSIS — I10 Essential (primary) hypertension: Secondary | ICD-10-CM | POA: Diagnosis not present

## 2018-07-23 DIAGNOSIS — M6281 Muscle weakness (generalized): Secondary | ICD-10-CM | POA: Diagnosis not present

## 2018-07-23 DIAGNOSIS — R262 Difficulty in walking, not elsewhere classified: Secondary | ICD-10-CM | POA: Diagnosis not present

## 2018-07-23 DIAGNOSIS — I639 Cerebral infarction, unspecified: Secondary | ICD-10-CM | POA: Diagnosis not present

## 2018-07-23 DIAGNOSIS — M19019 Primary osteoarthritis, unspecified shoulder: Secondary | ICD-10-CM | POA: Diagnosis not present

## 2018-07-23 DIAGNOSIS — R2689 Other abnormalities of gait and mobility: Secondary | ICD-10-CM | POA: Diagnosis not present

## 2018-07-23 DIAGNOSIS — E785 Hyperlipidemia, unspecified: Secondary | ICD-10-CM | POA: Diagnosis not present

## 2018-07-23 DIAGNOSIS — E119 Type 2 diabetes mellitus without complications: Secondary | ICD-10-CM | POA: Diagnosis not present

## 2018-07-23 DIAGNOSIS — R1311 Dysphagia, oral phase: Secondary | ICD-10-CM | POA: Diagnosis not present

## 2018-07-23 DIAGNOSIS — G309 Alzheimer's disease, unspecified: Secondary | ICD-10-CM | POA: Diagnosis not present

## 2018-07-23 DIAGNOSIS — R278 Other lack of coordination: Secondary | ICD-10-CM | POA: Diagnosis not present

## 2018-07-26 DIAGNOSIS — G309 Alzheimer's disease, unspecified: Secondary | ICD-10-CM | POA: Diagnosis not present

## 2018-07-26 DIAGNOSIS — E119 Type 2 diabetes mellitus without complications: Secondary | ICD-10-CM | POA: Diagnosis not present

## 2018-07-26 DIAGNOSIS — R262 Difficulty in walking, not elsewhere classified: Secondary | ICD-10-CM | POA: Diagnosis not present

## 2018-07-26 DIAGNOSIS — E785 Hyperlipidemia, unspecified: Secondary | ICD-10-CM | POA: Diagnosis not present

## 2018-07-26 DIAGNOSIS — R1311 Dysphagia, oral phase: Secondary | ICD-10-CM | POA: Diagnosis not present

## 2018-07-26 DIAGNOSIS — K219 Gastro-esophageal reflux disease without esophagitis: Secondary | ICD-10-CM | POA: Diagnosis not present

## 2018-07-26 DIAGNOSIS — R278 Other lack of coordination: Secondary | ICD-10-CM | POA: Diagnosis not present

## 2018-07-26 DIAGNOSIS — R2689 Other abnormalities of gait and mobility: Secondary | ICD-10-CM | POA: Diagnosis not present

## 2018-07-26 DIAGNOSIS — M6281 Muscle weakness (generalized): Secondary | ICD-10-CM | POA: Diagnosis not present

## 2018-07-26 DIAGNOSIS — I10 Essential (primary) hypertension: Secondary | ICD-10-CM | POA: Diagnosis not present

## 2018-07-26 DIAGNOSIS — M19019 Primary osteoarthritis, unspecified shoulder: Secondary | ICD-10-CM | POA: Diagnosis not present

## 2018-07-26 DIAGNOSIS — R633 Feeding difficulties: Secondary | ICD-10-CM | POA: Diagnosis not present

## 2018-07-27 DIAGNOSIS — E785 Hyperlipidemia, unspecified: Secondary | ICD-10-CM | POA: Diagnosis not present

## 2018-07-27 DIAGNOSIS — M19019 Primary osteoarthritis, unspecified shoulder: Secondary | ICD-10-CM | POA: Diagnosis not present

## 2018-07-27 DIAGNOSIS — I10 Essential (primary) hypertension: Secondary | ICD-10-CM | POA: Diagnosis not present

## 2018-07-27 DIAGNOSIS — K219 Gastro-esophageal reflux disease without esophagitis: Secondary | ICD-10-CM | POA: Diagnosis not present

## 2018-07-27 DIAGNOSIS — M6281 Muscle weakness (generalized): Secondary | ICD-10-CM | POA: Diagnosis not present

## 2018-07-27 DIAGNOSIS — R262 Difficulty in walking, not elsewhere classified: Secondary | ICD-10-CM | POA: Diagnosis not present

## 2018-07-27 DIAGNOSIS — R1311 Dysphagia, oral phase: Secondary | ICD-10-CM | POA: Diagnosis not present

## 2018-07-27 DIAGNOSIS — E119 Type 2 diabetes mellitus without complications: Secondary | ICD-10-CM | POA: Diagnosis not present

## 2018-07-27 DIAGNOSIS — R633 Feeding difficulties: Secondary | ICD-10-CM | POA: Diagnosis not present

## 2018-07-27 DIAGNOSIS — R2689 Other abnormalities of gait and mobility: Secondary | ICD-10-CM | POA: Diagnosis not present

## 2018-07-27 DIAGNOSIS — G309 Alzheimer's disease, unspecified: Secondary | ICD-10-CM | POA: Diagnosis not present

## 2018-07-27 DIAGNOSIS — R278 Other lack of coordination: Secondary | ICD-10-CM | POA: Diagnosis not present

## 2018-07-28 DIAGNOSIS — R278 Other lack of coordination: Secondary | ICD-10-CM | POA: Diagnosis not present

## 2018-07-28 DIAGNOSIS — G309 Alzheimer's disease, unspecified: Secondary | ICD-10-CM | POA: Diagnosis not present

## 2018-07-28 DIAGNOSIS — R1311 Dysphagia, oral phase: Secondary | ICD-10-CM | POA: Diagnosis not present

## 2018-07-28 DIAGNOSIS — I10 Essential (primary) hypertension: Secondary | ICD-10-CM | POA: Diagnosis not present

## 2018-07-28 DIAGNOSIS — R262 Difficulty in walking, not elsewhere classified: Secondary | ICD-10-CM | POA: Diagnosis not present

## 2018-07-28 DIAGNOSIS — M19019 Primary osteoarthritis, unspecified shoulder: Secondary | ICD-10-CM | POA: Diagnosis not present

## 2018-07-28 DIAGNOSIS — R633 Feeding difficulties: Secondary | ICD-10-CM | POA: Diagnosis not present

## 2018-07-28 DIAGNOSIS — K219 Gastro-esophageal reflux disease without esophagitis: Secondary | ICD-10-CM | POA: Diagnosis not present

## 2018-07-28 DIAGNOSIS — E785 Hyperlipidemia, unspecified: Secondary | ICD-10-CM | POA: Diagnosis not present

## 2018-07-28 DIAGNOSIS — R2689 Other abnormalities of gait and mobility: Secondary | ICD-10-CM | POA: Diagnosis not present

## 2018-07-28 DIAGNOSIS — E119 Type 2 diabetes mellitus without complications: Secondary | ICD-10-CM | POA: Diagnosis not present

## 2018-07-28 DIAGNOSIS — M6281 Muscle weakness (generalized): Secondary | ICD-10-CM | POA: Diagnosis not present

## 2018-07-29 DIAGNOSIS — E119 Type 2 diabetes mellitus without complications: Secondary | ICD-10-CM | POA: Diagnosis not present

## 2018-07-29 DIAGNOSIS — G309 Alzheimer's disease, unspecified: Secondary | ICD-10-CM | POA: Diagnosis not present

## 2018-07-29 DIAGNOSIS — E785 Hyperlipidemia, unspecified: Secondary | ICD-10-CM | POA: Diagnosis not present

## 2018-07-29 DIAGNOSIS — M6281 Muscle weakness (generalized): Secondary | ICD-10-CM | POA: Diagnosis not present

## 2018-07-29 DIAGNOSIS — M19019 Primary osteoarthritis, unspecified shoulder: Secondary | ICD-10-CM | POA: Diagnosis not present

## 2018-07-29 DIAGNOSIS — R278 Other lack of coordination: Secondary | ICD-10-CM | POA: Diagnosis not present

## 2018-07-29 DIAGNOSIS — R2689 Other abnormalities of gait and mobility: Secondary | ICD-10-CM | POA: Diagnosis not present

## 2018-07-29 DIAGNOSIS — I10 Essential (primary) hypertension: Secondary | ICD-10-CM | POA: Diagnosis not present

## 2018-07-29 DIAGNOSIS — R1311 Dysphagia, oral phase: Secondary | ICD-10-CM | POA: Diagnosis not present

## 2018-07-29 DIAGNOSIS — R262 Difficulty in walking, not elsewhere classified: Secondary | ICD-10-CM | POA: Diagnosis not present

## 2018-07-29 DIAGNOSIS — R633 Feeding difficulties: Secondary | ICD-10-CM | POA: Diagnosis not present

## 2018-07-29 DIAGNOSIS — K219 Gastro-esophageal reflux disease without esophagitis: Secondary | ICD-10-CM | POA: Diagnosis not present

## 2018-07-30 DIAGNOSIS — E119 Type 2 diabetes mellitus without complications: Secondary | ICD-10-CM | POA: Diagnosis not present

## 2018-07-30 DIAGNOSIS — R2689 Other abnormalities of gait and mobility: Secondary | ICD-10-CM | POA: Diagnosis not present

## 2018-07-30 DIAGNOSIS — R1311 Dysphagia, oral phase: Secondary | ICD-10-CM | POA: Diagnosis not present

## 2018-07-30 DIAGNOSIS — R278 Other lack of coordination: Secondary | ICD-10-CM | POA: Diagnosis not present

## 2018-07-30 DIAGNOSIS — E785 Hyperlipidemia, unspecified: Secondary | ICD-10-CM | POA: Diagnosis not present

## 2018-07-30 DIAGNOSIS — G309 Alzheimer's disease, unspecified: Secondary | ICD-10-CM | POA: Diagnosis not present

## 2018-07-30 DIAGNOSIS — I10 Essential (primary) hypertension: Secondary | ICD-10-CM | POA: Diagnosis not present

## 2018-07-30 DIAGNOSIS — K219 Gastro-esophageal reflux disease without esophagitis: Secondary | ICD-10-CM | POA: Diagnosis not present

## 2018-07-30 DIAGNOSIS — M19019 Primary osteoarthritis, unspecified shoulder: Secondary | ICD-10-CM | POA: Diagnosis not present

## 2018-07-30 DIAGNOSIS — R633 Feeding difficulties: Secondary | ICD-10-CM | POA: Diagnosis not present

## 2018-07-30 DIAGNOSIS — M6281 Muscle weakness (generalized): Secondary | ICD-10-CM | POA: Diagnosis not present

## 2018-07-30 DIAGNOSIS — R262 Difficulty in walking, not elsewhere classified: Secondary | ICD-10-CM | POA: Diagnosis not present

## 2018-08-02 DIAGNOSIS — M19019 Primary osteoarthritis, unspecified shoulder: Secondary | ICD-10-CM | POA: Diagnosis not present

## 2018-08-02 DIAGNOSIS — I10 Essential (primary) hypertension: Secondary | ICD-10-CM | POA: Diagnosis not present

## 2018-08-02 DIAGNOSIS — M6281 Muscle weakness (generalized): Secondary | ICD-10-CM | POA: Diagnosis not present

## 2018-08-02 DIAGNOSIS — R633 Feeding difficulties: Secondary | ICD-10-CM | POA: Diagnosis not present

## 2018-08-02 DIAGNOSIS — G309 Alzheimer's disease, unspecified: Secondary | ICD-10-CM | POA: Diagnosis not present

## 2018-08-02 DIAGNOSIS — R262 Difficulty in walking, not elsewhere classified: Secondary | ICD-10-CM | POA: Diagnosis not present

## 2018-08-02 DIAGNOSIS — R2689 Other abnormalities of gait and mobility: Secondary | ICD-10-CM | POA: Diagnosis not present

## 2018-08-02 DIAGNOSIS — R1311 Dysphagia, oral phase: Secondary | ICD-10-CM | POA: Diagnosis not present

## 2018-08-02 DIAGNOSIS — R278 Other lack of coordination: Secondary | ICD-10-CM | POA: Diagnosis not present

## 2018-08-02 DIAGNOSIS — K219 Gastro-esophageal reflux disease without esophagitis: Secondary | ICD-10-CM | POA: Diagnosis not present

## 2018-08-02 DIAGNOSIS — E785 Hyperlipidemia, unspecified: Secondary | ICD-10-CM | POA: Diagnosis not present

## 2018-08-02 DIAGNOSIS — E119 Type 2 diabetes mellitus without complications: Secondary | ICD-10-CM | POA: Diagnosis not present

## 2018-08-03 DIAGNOSIS — K219 Gastro-esophageal reflux disease without esophagitis: Secondary | ICD-10-CM | POA: Diagnosis not present

## 2018-08-03 DIAGNOSIS — R633 Feeding difficulties: Secondary | ICD-10-CM | POA: Diagnosis not present

## 2018-08-03 DIAGNOSIS — R1311 Dysphagia, oral phase: Secondary | ICD-10-CM | POA: Diagnosis not present

## 2018-08-03 DIAGNOSIS — M19019 Primary osteoarthritis, unspecified shoulder: Secondary | ICD-10-CM | POA: Diagnosis not present

## 2018-08-03 DIAGNOSIS — G309 Alzheimer's disease, unspecified: Secondary | ICD-10-CM | POA: Diagnosis not present

## 2018-08-03 DIAGNOSIS — R2689 Other abnormalities of gait and mobility: Secondary | ICD-10-CM | POA: Diagnosis not present

## 2018-08-03 DIAGNOSIS — M6281 Muscle weakness (generalized): Secondary | ICD-10-CM | POA: Diagnosis not present

## 2018-08-03 DIAGNOSIS — R262 Difficulty in walking, not elsewhere classified: Secondary | ICD-10-CM | POA: Diagnosis not present

## 2018-08-03 DIAGNOSIS — E785 Hyperlipidemia, unspecified: Secondary | ICD-10-CM | POA: Diagnosis not present

## 2018-08-03 DIAGNOSIS — R278 Other lack of coordination: Secondary | ICD-10-CM | POA: Diagnosis not present

## 2018-08-03 DIAGNOSIS — I10 Essential (primary) hypertension: Secondary | ICD-10-CM | POA: Diagnosis not present

## 2018-08-03 DIAGNOSIS — E119 Type 2 diabetes mellitus without complications: Secondary | ICD-10-CM | POA: Diagnosis not present

## 2018-08-05 DIAGNOSIS — R633 Feeding difficulties: Secondary | ICD-10-CM | POA: Diagnosis not present

## 2018-08-05 DIAGNOSIS — R2689 Other abnormalities of gait and mobility: Secondary | ICD-10-CM | POA: Diagnosis not present

## 2018-08-05 DIAGNOSIS — R1311 Dysphagia, oral phase: Secondary | ICD-10-CM | POA: Diagnosis not present

## 2018-08-05 DIAGNOSIS — M19019 Primary osteoarthritis, unspecified shoulder: Secondary | ICD-10-CM | POA: Diagnosis not present

## 2018-08-05 DIAGNOSIS — E119 Type 2 diabetes mellitus without complications: Secondary | ICD-10-CM | POA: Diagnosis not present

## 2018-08-05 DIAGNOSIS — E785 Hyperlipidemia, unspecified: Secondary | ICD-10-CM | POA: Diagnosis not present

## 2018-08-05 DIAGNOSIS — M6281 Muscle weakness (generalized): Secondary | ICD-10-CM | POA: Diagnosis not present

## 2018-08-05 DIAGNOSIS — G309 Alzheimer's disease, unspecified: Secondary | ICD-10-CM | POA: Diagnosis not present

## 2018-08-05 DIAGNOSIS — K219 Gastro-esophageal reflux disease without esophagitis: Secondary | ICD-10-CM | POA: Diagnosis not present

## 2018-08-05 DIAGNOSIS — I10 Essential (primary) hypertension: Secondary | ICD-10-CM | POA: Diagnosis not present

## 2018-08-05 DIAGNOSIS — R278 Other lack of coordination: Secondary | ICD-10-CM | POA: Diagnosis not present

## 2018-08-05 DIAGNOSIS — R262 Difficulty in walking, not elsewhere classified: Secondary | ICD-10-CM | POA: Diagnosis not present

## 2018-08-06 DIAGNOSIS — R262 Difficulty in walking, not elsewhere classified: Secondary | ICD-10-CM | POA: Diagnosis not present

## 2018-08-06 DIAGNOSIS — E785 Hyperlipidemia, unspecified: Secondary | ICD-10-CM | POA: Diagnosis not present

## 2018-08-06 DIAGNOSIS — M19019 Primary osteoarthritis, unspecified shoulder: Secondary | ICD-10-CM | POA: Diagnosis not present

## 2018-08-06 DIAGNOSIS — E119 Type 2 diabetes mellitus without complications: Secondary | ICD-10-CM | POA: Diagnosis not present

## 2018-08-06 DIAGNOSIS — R278 Other lack of coordination: Secondary | ICD-10-CM | POA: Diagnosis not present

## 2018-08-06 DIAGNOSIS — R569 Unspecified convulsions: Secondary | ICD-10-CM | POA: Diagnosis not present

## 2018-08-06 DIAGNOSIS — R633 Feeding difficulties: Secondary | ICD-10-CM | POA: Diagnosis not present

## 2018-08-06 DIAGNOSIS — D649 Anemia, unspecified: Secondary | ICD-10-CM | POA: Diagnosis not present

## 2018-08-06 DIAGNOSIS — G309 Alzheimer's disease, unspecified: Secondary | ICD-10-CM | POA: Diagnosis not present

## 2018-08-06 DIAGNOSIS — K219 Gastro-esophageal reflux disease without esophagitis: Secondary | ICD-10-CM | POA: Diagnosis not present

## 2018-08-06 DIAGNOSIS — R1311 Dysphagia, oral phase: Secondary | ICD-10-CM | POA: Diagnosis not present

## 2018-08-06 DIAGNOSIS — I10 Essential (primary) hypertension: Secondary | ICD-10-CM | POA: Diagnosis not present

## 2018-08-06 DIAGNOSIS — R2689 Other abnormalities of gait and mobility: Secondary | ICD-10-CM | POA: Diagnosis not present

## 2018-08-06 DIAGNOSIS — M6281 Muscle weakness (generalized): Secondary | ICD-10-CM | POA: Diagnosis not present

## 2018-08-09 DIAGNOSIS — R278 Other lack of coordination: Secondary | ICD-10-CM | POA: Diagnosis not present

## 2018-08-09 DIAGNOSIS — E119 Type 2 diabetes mellitus without complications: Secondary | ICD-10-CM | POA: Diagnosis not present

## 2018-08-09 DIAGNOSIS — R633 Feeding difficulties: Secondary | ICD-10-CM | POA: Diagnosis not present

## 2018-08-09 DIAGNOSIS — K219 Gastro-esophageal reflux disease without esophagitis: Secondary | ICD-10-CM | POA: Diagnosis not present

## 2018-08-09 DIAGNOSIS — I10 Essential (primary) hypertension: Secondary | ICD-10-CM | POA: Diagnosis not present

## 2018-08-09 DIAGNOSIS — M6281 Muscle weakness (generalized): Secondary | ICD-10-CM | POA: Diagnosis not present

## 2018-08-09 DIAGNOSIS — R262 Difficulty in walking, not elsewhere classified: Secondary | ICD-10-CM | POA: Diagnosis not present

## 2018-08-09 DIAGNOSIS — M19019 Primary osteoarthritis, unspecified shoulder: Secondary | ICD-10-CM | POA: Diagnosis not present

## 2018-08-09 DIAGNOSIS — R1311 Dysphagia, oral phase: Secondary | ICD-10-CM | POA: Diagnosis not present

## 2018-08-09 DIAGNOSIS — R2689 Other abnormalities of gait and mobility: Secondary | ICD-10-CM | POA: Diagnosis not present

## 2018-08-09 DIAGNOSIS — G309 Alzheimer's disease, unspecified: Secondary | ICD-10-CM | POA: Diagnosis not present

## 2018-08-09 DIAGNOSIS — E785 Hyperlipidemia, unspecified: Secondary | ICD-10-CM | POA: Diagnosis not present

## 2018-08-10 DIAGNOSIS — R278 Other lack of coordination: Secondary | ICD-10-CM | POA: Diagnosis not present

## 2018-08-10 DIAGNOSIS — R633 Feeding difficulties: Secondary | ICD-10-CM | POA: Diagnosis not present

## 2018-08-10 DIAGNOSIS — E785 Hyperlipidemia, unspecified: Secondary | ICD-10-CM | POA: Diagnosis not present

## 2018-08-10 DIAGNOSIS — G309 Alzheimer's disease, unspecified: Secondary | ICD-10-CM | POA: Diagnosis not present

## 2018-08-10 DIAGNOSIS — M6281 Muscle weakness (generalized): Secondary | ICD-10-CM | POA: Diagnosis not present

## 2018-08-10 DIAGNOSIS — R1311 Dysphagia, oral phase: Secondary | ICD-10-CM | POA: Diagnosis not present

## 2018-08-10 DIAGNOSIS — M19019 Primary osteoarthritis, unspecified shoulder: Secondary | ICD-10-CM | POA: Diagnosis not present

## 2018-08-10 DIAGNOSIS — I10 Essential (primary) hypertension: Secondary | ICD-10-CM | POA: Diagnosis not present

## 2018-08-10 DIAGNOSIS — K219 Gastro-esophageal reflux disease without esophagitis: Secondary | ICD-10-CM | POA: Diagnosis not present

## 2018-08-10 DIAGNOSIS — R262 Difficulty in walking, not elsewhere classified: Secondary | ICD-10-CM | POA: Diagnosis not present

## 2018-08-10 DIAGNOSIS — R2689 Other abnormalities of gait and mobility: Secondary | ICD-10-CM | POA: Diagnosis not present

## 2018-08-10 DIAGNOSIS — E119 Type 2 diabetes mellitus without complications: Secondary | ICD-10-CM | POA: Diagnosis not present

## 2018-08-11 DIAGNOSIS — R633 Feeding difficulties: Secondary | ICD-10-CM | POA: Diagnosis not present

## 2018-08-11 DIAGNOSIS — E119 Type 2 diabetes mellitus without complications: Secondary | ICD-10-CM | POA: Diagnosis not present

## 2018-08-11 DIAGNOSIS — I10 Essential (primary) hypertension: Secondary | ICD-10-CM | POA: Diagnosis not present

## 2018-08-11 DIAGNOSIS — E785 Hyperlipidemia, unspecified: Secondary | ICD-10-CM | POA: Diagnosis not present

## 2018-08-11 DIAGNOSIS — K219 Gastro-esophageal reflux disease without esophagitis: Secondary | ICD-10-CM | POA: Diagnosis not present

## 2018-08-11 DIAGNOSIS — G309 Alzheimer's disease, unspecified: Secondary | ICD-10-CM | POA: Diagnosis not present

## 2018-08-11 DIAGNOSIS — M6281 Muscle weakness (generalized): Secondary | ICD-10-CM | POA: Diagnosis not present

## 2018-08-11 DIAGNOSIS — R278 Other lack of coordination: Secondary | ICD-10-CM | POA: Diagnosis not present

## 2018-08-11 DIAGNOSIS — R2689 Other abnormalities of gait and mobility: Secondary | ICD-10-CM | POA: Diagnosis not present

## 2018-08-11 DIAGNOSIS — R262 Difficulty in walking, not elsewhere classified: Secondary | ICD-10-CM | POA: Diagnosis not present

## 2018-08-11 DIAGNOSIS — R1311 Dysphagia, oral phase: Secondary | ICD-10-CM | POA: Diagnosis not present

## 2018-08-11 DIAGNOSIS — M19019 Primary osteoarthritis, unspecified shoulder: Secondary | ICD-10-CM | POA: Diagnosis not present

## 2018-08-12 DIAGNOSIS — R1311 Dysphagia, oral phase: Secondary | ICD-10-CM | POA: Diagnosis not present

## 2018-08-12 DIAGNOSIS — R278 Other lack of coordination: Secondary | ICD-10-CM | POA: Diagnosis not present

## 2018-08-12 DIAGNOSIS — M6281 Muscle weakness (generalized): Secondary | ICD-10-CM | POA: Diagnosis not present

## 2018-08-12 DIAGNOSIS — R2689 Other abnormalities of gait and mobility: Secondary | ICD-10-CM | POA: Diagnosis not present

## 2018-08-12 DIAGNOSIS — I10 Essential (primary) hypertension: Secondary | ICD-10-CM | POA: Diagnosis not present

## 2018-08-12 DIAGNOSIS — G309 Alzheimer's disease, unspecified: Secondary | ICD-10-CM | POA: Diagnosis not present

## 2018-08-12 DIAGNOSIS — E785 Hyperlipidemia, unspecified: Secondary | ICD-10-CM | POA: Diagnosis not present

## 2018-08-12 DIAGNOSIS — M19019 Primary osteoarthritis, unspecified shoulder: Secondary | ICD-10-CM | POA: Diagnosis not present

## 2018-08-12 DIAGNOSIS — K219 Gastro-esophageal reflux disease without esophagitis: Secondary | ICD-10-CM | POA: Diagnosis not present

## 2018-08-12 DIAGNOSIS — R633 Feeding difficulties: Secondary | ICD-10-CM | POA: Diagnosis not present

## 2018-08-12 DIAGNOSIS — E119 Type 2 diabetes mellitus without complications: Secondary | ICD-10-CM | POA: Diagnosis not present

## 2018-08-12 DIAGNOSIS — R262 Difficulty in walking, not elsewhere classified: Secondary | ICD-10-CM | POA: Diagnosis not present

## 2018-08-13 DIAGNOSIS — R633 Feeding difficulties: Secondary | ICD-10-CM | POA: Diagnosis not present

## 2018-08-13 DIAGNOSIS — M6281 Muscle weakness (generalized): Secondary | ICD-10-CM | POA: Diagnosis not present

## 2018-08-13 DIAGNOSIS — M19019 Primary osteoarthritis, unspecified shoulder: Secondary | ICD-10-CM | POA: Diagnosis not present

## 2018-08-13 DIAGNOSIS — R2689 Other abnormalities of gait and mobility: Secondary | ICD-10-CM | POA: Diagnosis not present

## 2018-08-13 DIAGNOSIS — R1311 Dysphagia, oral phase: Secondary | ICD-10-CM | POA: Diagnosis not present

## 2018-08-13 DIAGNOSIS — I10 Essential (primary) hypertension: Secondary | ICD-10-CM | POA: Diagnosis not present

## 2018-08-13 DIAGNOSIS — R278 Other lack of coordination: Secondary | ICD-10-CM | POA: Diagnosis not present

## 2018-08-13 DIAGNOSIS — E119 Type 2 diabetes mellitus without complications: Secondary | ICD-10-CM | POA: Diagnosis not present

## 2018-08-13 DIAGNOSIS — K219 Gastro-esophageal reflux disease without esophagitis: Secondary | ICD-10-CM | POA: Diagnosis not present

## 2018-08-13 DIAGNOSIS — M1990 Unspecified osteoarthritis, unspecified site: Secondary | ICD-10-CM | POA: Diagnosis not present

## 2018-08-13 DIAGNOSIS — I639 Cerebral infarction, unspecified: Secondary | ICD-10-CM | POA: Diagnosis not present

## 2018-08-13 DIAGNOSIS — R262 Difficulty in walking, not elsewhere classified: Secondary | ICD-10-CM | POA: Diagnosis not present

## 2018-08-13 DIAGNOSIS — E1140 Type 2 diabetes mellitus with diabetic neuropathy, unspecified: Secondary | ICD-10-CM | POA: Diagnosis not present

## 2018-08-13 DIAGNOSIS — G309 Alzheimer's disease, unspecified: Secondary | ICD-10-CM | POA: Diagnosis not present

## 2018-08-13 DIAGNOSIS — E785 Hyperlipidemia, unspecified: Secondary | ICD-10-CM | POA: Diagnosis not present

## 2018-08-16 DIAGNOSIS — R633 Feeding difficulties: Secondary | ICD-10-CM | POA: Diagnosis not present

## 2018-08-16 DIAGNOSIS — K219 Gastro-esophageal reflux disease without esophagitis: Secondary | ICD-10-CM | POA: Diagnosis not present

## 2018-08-16 DIAGNOSIS — R278 Other lack of coordination: Secondary | ICD-10-CM | POA: Diagnosis not present

## 2018-08-16 DIAGNOSIS — E785 Hyperlipidemia, unspecified: Secondary | ICD-10-CM | POA: Diagnosis not present

## 2018-08-16 DIAGNOSIS — E119 Type 2 diabetes mellitus without complications: Secondary | ICD-10-CM | POA: Diagnosis not present

## 2018-08-16 DIAGNOSIS — R2689 Other abnormalities of gait and mobility: Secondary | ICD-10-CM | POA: Diagnosis not present

## 2018-08-16 DIAGNOSIS — M6281 Muscle weakness (generalized): Secondary | ICD-10-CM | POA: Diagnosis not present

## 2018-08-16 DIAGNOSIS — I10 Essential (primary) hypertension: Secondary | ICD-10-CM | POA: Diagnosis not present

## 2018-08-16 DIAGNOSIS — G309 Alzheimer's disease, unspecified: Secondary | ICD-10-CM | POA: Diagnosis not present

## 2018-08-16 DIAGNOSIS — R1311 Dysphagia, oral phase: Secondary | ICD-10-CM | POA: Diagnosis not present

## 2018-08-16 DIAGNOSIS — R262 Difficulty in walking, not elsewhere classified: Secondary | ICD-10-CM | POA: Diagnosis not present

## 2018-08-16 DIAGNOSIS — M19019 Primary osteoarthritis, unspecified shoulder: Secondary | ICD-10-CM | POA: Diagnosis not present

## 2018-08-17 DIAGNOSIS — R633 Feeding difficulties: Secondary | ICD-10-CM | POA: Diagnosis not present

## 2018-08-17 DIAGNOSIS — E119 Type 2 diabetes mellitus without complications: Secondary | ICD-10-CM | POA: Diagnosis not present

## 2018-08-17 DIAGNOSIS — M19019 Primary osteoarthritis, unspecified shoulder: Secondary | ICD-10-CM | POA: Diagnosis not present

## 2018-08-17 DIAGNOSIS — G309 Alzheimer's disease, unspecified: Secondary | ICD-10-CM | POA: Diagnosis not present

## 2018-08-17 DIAGNOSIS — K219 Gastro-esophageal reflux disease without esophagitis: Secondary | ICD-10-CM | POA: Diagnosis not present

## 2018-08-17 DIAGNOSIS — I10 Essential (primary) hypertension: Secondary | ICD-10-CM | POA: Diagnosis not present

## 2018-08-17 DIAGNOSIS — M6281 Muscle weakness (generalized): Secondary | ICD-10-CM | POA: Diagnosis not present

## 2018-08-17 DIAGNOSIS — R1311 Dysphagia, oral phase: Secondary | ICD-10-CM | POA: Diagnosis not present

## 2018-08-17 DIAGNOSIS — R2689 Other abnormalities of gait and mobility: Secondary | ICD-10-CM | POA: Diagnosis not present

## 2018-08-17 DIAGNOSIS — R262 Difficulty in walking, not elsewhere classified: Secondary | ICD-10-CM | POA: Diagnosis not present

## 2018-08-17 DIAGNOSIS — R278 Other lack of coordination: Secondary | ICD-10-CM | POA: Diagnosis not present

## 2018-08-17 DIAGNOSIS — E785 Hyperlipidemia, unspecified: Secondary | ICD-10-CM | POA: Diagnosis not present

## 2018-08-18 DIAGNOSIS — R262 Difficulty in walking, not elsewhere classified: Secondary | ICD-10-CM | POA: Diagnosis not present

## 2018-08-18 DIAGNOSIS — R1311 Dysphagia, oral phase: Secondary | ICD-10-CM | POA: Diagnosis not present

## 2018-08-18 DIAGNOSIS — E785 Hyperlipidemia, unspecified: Secondary | ICD-10-CM | POA: Diagnosis not present

## 2018-08-18 DIAGNOSIS — K219 Gastro-esophageal reflux disease without esophagitis: Secondary | ICD-10-CM | POA: Diagnosis not present

## 2018-08-18 DIAGNOSIS — R2689 Other abnormalities of gait and mobility: Secondary | ICD-10-CM | POA: Diagnosis not present

## 2018-08-18 DIAGNOSIS — E119 Type 2 diabetes mellitus without complications: Secondary | ICD-10-CM | POA: Diagnosis not present

## 2018-08-18 DIAGNOSIS — R278 Other lack of coordination: Secondary | ICD-10-CM | POA: Diagnosis not present

## 2018-08-18 DIAGNOSIS — I10 Essential (primary) hypertension: Secondary | ICD-10-CM | POA: Diagnosis not present

## 2018-08-18 DIAGNOSIS — M19019 Primary osteoarthritis, unspecified shoulder: Secondary | ICD-10-CM | POA: Diagnosis not present

## 2018-08-18 DIAGNOSIS — G309 Alzheimer's disease, unspecified: Secondary | ICD-10-CM | POA: Diagnosis not present

## 2018-08-18 DIAGNOSIS — R633 Feeding difficulties: Secondary | ICD-10-CM | POA: Diagnosis not present

## 2018-08-18 DIAGNOSIS — M6281 Muscle weakness (generalized): Secondary | ICD-10-CM | POA: Diagnosis not present

## 2018-08-19 DIAGNOSIS — R633 Feeding difficulties: Secondary | ICD-10-CM | POA: Diagnosis not present

## 2018-08-19 DIAGNOSIS — M19019 Primary osteoarthritis, unspecified shoulder: Secondary | ICD-10-CM | POA: Diagnosis not present

## 2018-08-19 DIAGNOSIS — R1311 Dysphagia, oral phase: Secondary | ICD-10-CM | POA: Diagnosis not present

## 2018-08-19 DIAGNOSIS — E119 Type 2 diabetes mellitus without complications: Secondary | ICD-10-CM | POA: Diagnosis not present

## 2018-08-19 DIAGNOSIS — R262 Difficulty in walking, not elsewhere classified: Secondary | ICD-10-CM | POA: Diagnosis not present

## 2018-08-19 DIAGNOSIS — I10 Essential (primary) hypertension: Secondary | ICD-10-CM | POA: Diagnosis not present

## 2018-08-19 DIAGNOSIS — M6281 Muscle weakness (generalized): Secondary | ICD-10-CM | POA: Diagnosis not present

## 2018-08-19 DIAGNOSIS — R2689 Other abnormalities of gait and mobility: Secondary | ICD-10-CM | POA: Diagnosis not present

## 2018-08-19 DIAGNOSIS — G309 Alzheimer's disease, unspecified: Secondary | ICD-10-CM | POA: Diagnosis not present

## 2018-08-19 DIAGNOSIS — K219 Gastro-esophageal reflux disease without esophagitis: Secondary | ICD-10-CM | POA: Diagnosis not present

## 2018-08-19 DIAGNOSIS — E785 Hyperlipidemia, unspecified: Secondary | ICD-10-CM | POA: Diagnosis not present

## 2018-08-19 DIAGNOSIS — R278 Other lack of coordination: Secondary | ICD-10-CM | POA: Diagnosis not present

## 2018-08-20 DIAGNOSIS — R1311 Dysphagia, oral phase: Secondary | ICD-10-CM | POA: Diagnosis not present

## 2018-08-20 DIAGNOSIS — E785 Hyperlipidemia, unspecified: Secondary | ICD-10-CM | POA: Diagnosis not present

## 2018-08-20 DIAGNOSIS — R2689 Other abnormalities of gait and mobility: Secondary | ICD-10-CM | POA: Diagnosis not present

## 2018-08-20 DIAGNOSIS — G309 Alzheimer's disease, unspecified: Secondary | ICD-10-CM | POA: Diagnosis not present

## 2018-08-20 DIAGNOSIS — I10 Essential (primary) hypertension: Secondary | ICD-10-CM | POA: Diagnosis not present

## 2018-08-20 DIAGNOSIS — R262 Difficulty in walking, not elsewhere classified: Secondary | ICD-10-CM | POA: Diagnosis not present

## 2018-08-20 DIAGNOSIS — R278 Other lack of coordination: Secondary | ICD-10-CM | POA: Diagnosis not present

## 2018-08-20 DIAGNOSIS — R633 Feeding difficulties: Secondary | ICD-10-CM | POA: Diagnosis not present

## 2018-08-20 DIAGNOSIS — E119 Type 2 diabetes mellitus without complications: Secondary | ICD-10-CM | POA: Diagnosis not present

## 2018-08-20 DIAGNOSIS — K219 Gastro-esophageal reflux disease without esophagitis: Secondary | ICD-10-CM | POA: Diagnosis not present

## 2018-08-20 DIAGNOSIS — M6281 Muscle weakness (generalized): Secondary | ICD-10-CM | POA: Diagnosis not present

## 2018-08-20 DIAGNOSIS — M19019 Primary osteoarthritis, unspecified shoulder: Secondary | ICD-10-CM | POA: Diagnosis not present

## 2018-08-23 DIAGNOSIS — M6281 Muscle weakness (generalized): Secondary | ICD-10-CM | POA: Diagnosis not present

## 2018-08-23 DIAGNOSIS — R2689 Other abnormalities of gait and mobility: Secondary | ICD-10-CM | POA: Diagnosis not present

## 2018-08-23 DIAGNOSIS — R633 Feeding difficulties: Secondary | ICD-10-CM | POA: Diagnosis not present

## 2018-08-23 DIAGNOSIS — E785 Hyperlipidemia, unspecified: Secondary | ICD-10-CM | POA: Diagnosis not present

## 2018-08-23 DIAGNOSIS — I10 Essential (primary) hypertension: Secondary | ICD-10-CM | POA: Diagnosis not present

## 2018-08-23 DIAGNOSIS — M19019 Primary osteoarthritis, unspecified shoulder: Secondary | ICD-10-CM | POA: Diagnosis not present

## 2018-08-23 DIAGNOSIS — K219 Gastro-esophageal reflux disease without esophagitis: Secondary | ICD-10-CM | POA: Diagnosis not present

## 2018-08-23 DIAGNOSIS — R1311 Dysphagia, oral phase: Secondary | ICD-10-CM | POA: Diagnosis not present

## 2018-08-23 DIAGNOSIS — G309 Alzheimer's disease, unspecified: Secondary | ICD-10-CM | POA: Diagnosis not present

## 2018-08-23 DIAGNOSIS — R262 Difficulty in walking, not elsewhere classified: Secondary | ICD-10-CM | POA: Diagnosis not present

## 2018-08-23 DIAGNOSIS — E119 Type 2 diabetes mellitus without complications: Secondary | ICD-10-CM | POA: Diagnosis not present

## 2018-08-23 DIAGNOSIS — R278 Other lack of coordination: Secondary | ICD-10-CM | POA: Diagnosis not present

## 2018-08-24 DIAGNOSIS — G309 Alzheimer's disease, unspecified: Secondary | ICD-10-CM | POA: Diagnosis not present

## 2018-08-24 DIAGNOSIS — K219 Gastro-esophageal reflux disease without esophagitis: Secondary | ICD-10-CM | POA: Diagnosis not present

## 2018-08-24 DIAGNOSIS — M19019 Primary osteoarthritis, unspecified shoulder: Secondary | ICD-10-CM | POA: Diagnosis not present

## 2018-08-24 DIAGNOSIS — M6281 Muscle weakness (generalized): Secondary | ICD-10-CM | POA: Diagnosis not present

## 2018-08-24 DIAGNOSIS — I10 Essential (primary) hypertension: Secondary | ICD-10-CM | POA: Diagnosis not present

## 2018-08-24 DIAGNOSIS — E785 Hyperlipidemia, unspecified: Secondary | ICD-10-CM | POA: Diagnosis not present

## 2018-08-24 DIAGNOSIS — R633 Feeding difficulties: Secondary | ICD-10-CM | POA: Diagnosis not present

## 2018-08-24 DIAGNOSIS — R2689 Other abnormalities of gait and mobility: Secondary | ICD-10-CM | POA: Diagnosis not present

## 2018-08-24 DIAGNOSIS — R278 Other lack of coordination: Secondary | ICD-10-CM | POA: Diagnosis not present

## 2018-08-24 DIAGNOSIS — R1311 Dysphagia, oral phase: Secondary | ICD-10-CM | POA: Diagnosis not present

## 2018-08-24 DIAGNOSIS — E119 Type 2 diabetes mellitus without complications: Secondary | ICD-10-CM | POA: Diagnosis not present

## 2018-08-24 DIAGNOSIS — R262 Difficulty in walking, not elsewhere classified: Secondary | ICD-10-CM | POA: Diagnosis not present

## 2018-08-25 DIAGNOSIS — K219 Gastro-esophageal reflux disease without esophagitis: Secondary | ICD-10-CM | POA: Diagnosis not present

## 2018-08-25 DIAGNOSIS — R278 Other lack of coordination: Secondary | ICD-10-CM | POA: Diagnosis not present

## 2018-08-25 DIAGNOSIS — G309 Alzheimer's disease, unspecified: Secondary | ICD-10-CM | POA: Diagnosis not present

## 2018-08-25 DIAGNOSIS — R2689 Other abnormalities of gait and mobility: Secondary | ICD-10-CM | POA: Diagnosis not present

## 2018-08-25 DIAGNOSIS — E785 Hyperlipidemia, unspecified: Secondary | ICD-10-CM | POA: Diagnosis not present

## 2018-08-25 DIAGNOSIS — R633 Feeding difficulties: Secondary | ICD-10-CM | POA: Diagnosis not present

## 2018-08-25 DIAGNOSIS — E119 Type 2 diabetes mellitus without complications: Secondary | ICD-10-CM | POA: Diagnosis not present

## 2018-08-25 DIAGNOSIS — R262 Difficulty in walking, not elsewhere classified: Secondary | ICD-10-CM | POA: Diagnosis not present

## 2018-08-25 DIAGNOSIS — M19019 Primary osteoarthritis, unspecified shoulder: Secondary | ICD-10-CM | POA: Diagnosis not present

## 2018-08-25 DIAGNOSIS — M6281 Muscle weakness (generalized): Secondary | ICD-10-CM | POA: Diagnosis not present

## 2018-08-25 DIAGNOSIS — R1311 Dysphagia, oral phase: Secondary | ICD-10-CM | POA: Diagnosis not present

## 2018-08-25 DIAGNOSIS — I10 Essential (primary) hypertension: Secondary | ICD-10-CM | POA: Diagnosis not present

## 2018-08-26 DIAGNOSIS — M19019 Primary osteoarthritis, unspecified shoulder: Secondary | ICD-10-CM | POA: Diagnosis not present

## 2018-08-26 DIAGNOSIS — R633 Feeding difficulties: Secondary | ICD-10-CM | POA: Diagnosis not present

## 2018-08-26 DIAGNOSIS — E119 Type 2 diabetes mellitus without complications: Secondary | ICD-10-CM | POA: Diagnosis not present

## 2018-08-26 DIAGNOSIS — M6281 Muscle weakness (generalized): Secondary | ICD-10-CM | POA: Diagnosis not present

## 2018-08-26 DIAGNOSIS — R1311 Dysphagia, oral phase: Secondary | ICD-10-CM | POA: Diagnosis not present

## 2018-08-26 DIAGNOSIS — I10 Essential (primary) hypertension: Secondary | ICD-10-CM | POA: Diagnosis not present

## 2018-08-26 DIAGNOSIS — R262 Difficulty in walking, not elsewhere classified: Secondary | ICD-10-CM | POA: Diagnosis not present

## 2018-08-26 DIAGNOSIS — E785 Hyperlipidemia, unspecified: Secondary | ICD-10-CM | POA: Diagnosis not present

## 2018-08-26 DIAGNOSIS — K219 Gastro-esophageal reflux disease without esophagitis: Secondary | ICD-10-CM | POA: Diagnosis not present

## 2018-08-26 DIAGNOSIS — R278 Other lack of coordination: Secondary | ICD-10-CM | POA: Diagnosis not present

## 2018-08-26 DIAGNOSIS — R2689 Other abnormalities of gait and mobility: Secondary | ICD-10-CM | POA: Diagnosis not present

## 2018-08-26 DIAGNOSIS — G309 Alzheimer's disease, unspecified: Secondary | ICD-10-CM | POA: Diagnosis not present

## 2018-08-27 DIAGNOSIS — M19019 Primary osteoarthritis, unspecified shoulder: Secondary | ICD-10-CM | POA: Diagnosis not present

## 2018-08-27 DIAGNOSIS — R1311 Dysphagia, oral phase: Secondary | ICD-10-CM | POA: Diagnosis not present

## 2018-08-27 DIAGNOSIS — K219 Gastro-esophageal reflux disease without esophagitis: Secondary | ICD-10-CM | POA: Diagnosis not present

## 2018-08-27 DIAGNOSIS — R633 Feeding difficulties: Secondary | ICD-10-CM | POA: Diagnosis not present

## 2018-08-27 DIAGNOSIS — R262 Difficulty in walking, not elsewhere classified: Secondary | ICD-10-CM | POA: Diagnosis not present

## 2018-08-27 DIAGNOSIS — I10 Essential (primary) hypertension: Secondary | ICD-10-CM | POA: Diagnosis not present

## 2018-08-27 DIAGNOSIS — M6281 Muscle weakness (generalized): Secondary | ICD-10-CM | POA: Diagnosis not present

## 2018-08-27 DIAGNOSIS — E785 Hyperlipidemia, unspecified: Secondary | ICD-10-CM | POA: Diagnosis not present

## 2018-08-27 DIAGNOSIS — R2689 Other abnormalities of gait and mobility: Secondary | ICD-10-CM | POA: Diagnosis not present

## 2018-08-27 DIAGNOSIS — G309 Alzheimer's disease, unspecified: Secondary | ICD-10-CM | POA: Diagnosis not present

## 2018-08-27 DIAGNOSIS — E119 Type 2 diabetes mellitus without complications: Secondary | ICD-10-CM | POA: Diagnosis not present

## 2018-08-27 DIAGNOSIS — R278 Other lack of coordination: Secondary | ICD-10-CM | POA: Diagnosis not present

## 2018-08-30 DIAGNOSIS — E119 Type 2 diabetes mellitus without complications: Secondary | ICD-10-CM | POA: Diagnosis not present

## 2018-08-30 DIAGNOSIS — M19019 Primary osteoarthritis, unspecified shoulder: Secondary | ICD-10-CM | POA: Diagnosis not present

## 2018-08-30 DIAGNOSIS — G309 Alzheimer's disease, unspecified: Secondary | ICD-10-CM | POA: Diagnosis not present

## 2018-08-30 DIAGNOSIS — R262 Difficulty in walking, not elsewhere classified: Secondary | ICD-10-CM | POA: Diagnosis not present

## 2018-08-30 DIAGNOSIS — E785 Hyperlipidemia, unspecified: Secondary | ICD-10-CM | POA: Diagnosis not present

## 2018-08-30 DIAGNOSIS — M6281 Muscle weakness (generalized): Secondary | ICD-10-CM | POA: Diagnosis not present

## 2018-08-30 DIAGNOSIS — R2689 Other abnormalities of gait and mobility: Secondary | ICD-10-CM | POA: Diagnosis not present

## 2018-08-30 DIAGNOSIS — R633 Feeding difficulties: Secondary | ICD-10-CM | POA: Diagnosis not present

## 2018-08-30 DIAGNOSIS — I10 Essential (primary) hypertension: Secondary | ICD-10-CM | POA: Diagnosis not present

## 2018-08-30 DIAGNOSIS — K219 Gastro-esophageal reflux disease without esophagitis: Secondary | ICD-10-CM | POA: Diagnosis not present

## 2018-08-30 DIAGNOSIS — R1311 Dysphagia, oral phase: Secondary | ICD-10-CM | POA: Diagnosis not present

## 2018-08-30 DIAGNOSIS — R278 Other lack of coordination: Secondary | ICD-10-CM | POA: Diagnosis not present

## 2018-09-14 DIAGNOSIS — I10 Essential (primary) hypertension: Secondary | ICD-10-CM | POA: Diagnosis not present

## 2018-09-14 DIAGNOSIS — R451 Restlessness and agitation: Secondary | ICD-10-CM | POA: Diagnosis not present

## 2018-09-16 DIAGNOSIS — N390 Urinary tract infection, site not specified: Secondary | ICD-10-CM | POA: Diagnosis not present

## 2018-09-16 DIAGNOSIS — R319 Hematuria, unspecified: Secondary | ICD-10-CM | POA: Diagnosis not present

## 2018-10-04 DIAGNOSIS — R451 Restlessness and agitation: Secondary | ICD-10-CM | POA: Diagnosis not present

## 2018-10-04 DIAGNOSIS — E119 Type 2 diabetes mellitus without complications: Secondary | ICD-10-CM | POA: Diagnosis not present

## 2018-10-15 DIAGNOSIS — I639 Cerebral infarction, unspecified: Secondary | ICD-10-CM | POA: Diagnosis not present

## 2018-10-15 DIAGNOSIS — I10 Essential (primary) hypertension: Secondary | ICD-10-CM | POA: Diagnosis not present

## 2018-10-15 DIAGNOSIS — M1990 Unspecified osteoarthritis, unspecified site: Secondary | ICD-10-CM | POA: Diagnosis not present

## 2018-11-26 DIAGNOSIS — E119 Type 2 diabetes mellitus without complications: Secondary | ICD-10-CM | POA: Diagnosis not present

## 2018-11-26 DIAGNOSIS — R05 Cough: Secondary | ICD-10-CM | POA: Diagnosis not present

## 2018-11-26 DIAGNOSIS — I10 Essential (primary) hypertension: Secondary | ICD-10-CM | POA: Diagnosis not present

## 2018-12-03 DIAGNOSIS — I10 Essential (primary) hypertension: Secondary | ICD-10-CM | POA: Diagnosis not present

## 2018-12-03 DIAGNOSIS — R569 Unspecified convulsions: Secondary | ICD-10-CM | POA: Diagnosis not present

## 2018-12-15 DIAGNOSIS — G301 Alzheimer's disease with late onset: Secondary | ICD-10-CM | POA: Diagnosis not present

## 2018-12-17 DIAGNOSIS — I10 Essential (primary) hypertension: Secondary | ICD-10-CM | POA: Diagnosis not present

## 2018-12-17 DIAGNOSIS — I639 Cerebral infarction, unspecified: Secondary | ICD-10-CM | POA: Diagnosis not present

## 2018-12-17 DIAGNOSIS — E46 Unspecified protein-calorie malnutrition: Secondary | ICD-10-CM | POA: Diagnosis not present

## 2019-01-09 DIAGNOSIS — E1140 Type 2 diabetes mellitus with diabetic neuropathy, unspecified: Secondary | ICD-10-CM | POA: Diagnosis not present

## 2019-01-09 DIAGNOSIS — K219 Gastro-esophageal reflux disease without esophagitis: Secondary | ICD-10-CM | POA: Diagnosis not present

## 2019-01-09 DIAGNOSIS — I10 Essential (primary) hypertension: Secondary | ICD-10-CM | POA: Diagnosis not present

## 2019-01-10 DIAGNOSIS — K219 Gastro-esophageal reflux disease without esophagitis: Secondary | ICD-10-CM | POA: Diagnosis not present

## 2019-01-10 DIAGNOSIS — D649 Anemia, unspecified: Secondary | ICD-10-CM | POA: Diagnosis not present

## 2019-01-10 DIAGNOSIS — E119 Type 2 diabetes mellitus without complications: Secondary | ICD-10-CM | POA: Diagnosis not present

## 2019-01-10 DIAGNOSIS — R5383 Other fatigue: Secondary | ICD-10-CM | POA: Diagnosis not present

## 2019-01-12 DIAGNOSIS — G301 Alzheimer's disease with late onset: Secondary | ICD-10-CM | POA: Diagnosis not present

## 2019-01-12 DIAGNOSIS — I10 Essential (primary) hypertension: Secondary | ICD-10-CM | POA: Diagnosis not present

## 2019-01-12 DIAGNOSIS — R05 Cough: Secondary | ICD-10-CM | POA: Diagnosis not present

## 2019-01-12 DIAGNOSIS — E1140 Type 2 diabetes mellitus with diabetic neuropathy, unspecified: Secondary | ICD-10-CM | POA: Diagnosis not present

## 2019-01-19 DIAGNOSIS — R05 Cough: Secondary | ICD-10-CM | POA: Diagnosis not present

## 2019-01-19 DIAGNOSIS — I10 Essential (primary) hypertension: Secondary | ICD-10-CM | POA: Diagnosis not present

## 2019-01-19 DIAGNOSIS — E1140 Type 2 diabetes mellitus with diabetic neuropathy, unspecified: Secondary | ICD-10-CM | POA: Diagnosis not present

## 2019-01-20 DIAGNOSIS — I10 Essential (primary) hypertension: Secondary | ICD-10-CM | POA: Diagnosis not present

## 2019-01-20 DIAGNOSIS — E1140 Type 2 diabetes mellitus with diabetic neuropathy, unspecified: Secondary | ICD-10-CM | POA: Diagnosis not present

## 2019-01-20 DIAGNOSIS — J189 Pneumonia, unspecified organism: Secondary | ICD-10-CM | POA: Diagnosis not present

## 2019-01-24 DIAGNOSIS — E039 Hypothyroidism, unspecified: Secondary | ICD-10-CM | POA: Diagnosis not present

## 2019-01-24 DIAGNOSIS — E785 Hyperlipidemia, unspecified: Secondary | ICD-10-CM | POA: Diagnosis not present

## 2019-01-24 DIAGNOSIS — E559 Vitamin D deficiency, unspecified: Secondary | ICD-10-CM | POA: Diagnosis not present

## 2019-01-24 DIAGNOSIS — D649 Anemia, unspecified: Secondary | ICD-10-CM | POA: Diagnosis not present

## 2019-01-24 DIAGNOSIS — I10 Essential (primary) hypertension: Secondary | ICD-10-CM | POA: Diagnosis not present

## 2019-01-24 DIAGNOSIS — E119 Type 2 diabetes mellitus without complications: Secondary | ICD-10-CM | POA: Diagnosis not present

## 2019-01-25 ENCOUNTER — Inpatient Hospital Stay (HOSPITAL_COMMUNITY)
Admission: EM | Admit: 2019-01-25 | Discharge: 2019-01-27 | DRG: 641 | Disposition: A | Discharge status: Skilled Nursing Facility | Payer: Medicare Other | Source: Skilled Nursing Facility | Attending: Internal Medicine | Admitting: Internal Medicine

## 2019-01-25 ENCOUNTER — Emergency Department (HOSPITAL_COMMUNITY): Payer: Medicare Other

## 2019-01-25 ENCOUNTER — Encounter (HOSPITAL_COMMUNITY): Payer: Self-pay

## 2019-01-25 ENCOUNTER — Other Ambulatory Visit: Payer: Self-pay

## 2019-01-25 DIAGNOSIS — I471 Supraventricular tachycardia, unspecified: Secondary | ICD-10-CM | POA: Diagnosis present

## 2019-01-25 DIAGNOSIS — R509 Fever, unspecified: Secondary | ICD-10-CM | POA: Diagnosis not present

## 2019-01-25 DIAGNOSIS — E11649 Type 2 diabetes mellitus with hypoglycemia without coma: Secondary | ICD-10-CM | POA: Diagnosis present

## 2019-01-25 DIAGNOSIS — R69 Illness, unspecified: Secondary | ICD-10-CM

## 2019-01-25 DIAGNOSIS — M2550 Pain in unspecified joint: Secondary | ICD-10-CM | POA: Diagnosis not present

## 2019-01-25 DIAGNOSIS — E785 Hyperlipidemia, unspecified: Secondary | ICD-10-CM | POA: Diagnosis present

## 2019-01-25 DIAGNOSIS — F0150 Vascular dementia without behavioral disturbance: Secondary | ICD-10-CM | POA: Diagnosis present

## 2019-01-25 DIAGNOSIS — Z888 Allergy status to other drugs, medicaments and biological substances status: Secondary | ICD-10-CM | POA: Diagnosis not present

## 2019-01-25 DIAGNOSIS — H9193 Unspecified hearing loss, bilateral: Secondary | ICD-10-CM | POA: Diagnosis present

## 2019-01-25 DIAGNOSIS — F4310 Post-traumatic stress disorder, unspecified: Secondary | ICD-10-CM | POA: Diagnosis present

## 2019-01-25 DIAGNOSIS — Z885 Allergy status to narcotic agent status: Secondary | ICD-10-CM | POA: Diagnosis not present

## 2019-01-25 DIAGNOSIS — E1142 Type 2 diabetes mellitus with diabetic polyneuropathy: Secondary | ICD-10-CM | POA: Diagnosis not present

## 2019-01-25 DIAGNOSIS — Z7401 Bed confinement status: Secondary | ICD-10-CM | POA: Diagnosis not present

## 2019-01-25 DIAGNOSIS — I10 Essential (primary) hypertension: Secondary | ICD-10-CM | POA: Diagnosis present

## 2019-01-25 DIAGNOSIS — I272 Pulmonary hypertension, unspecified: Secondary | ICD-10-CM | POA: Diagnosis not present

## 2019-01-25 DIAGNOSIS — E1149 Type 2 diabetes mellitus with other diabetic neurological complication: Secondary | ICD-10-CM | POA: Diagnosis present

## 2019-01-25 DIAGNOSIS — R410 Disorientation, unspecified: Secondary | ICD-10-CM | POA: Diagnosis not present

## 2019-01-25 DIAGNOSIS — E860 Dehydration: Secondary | ICD-10-CM | POA: Diagnosis not present

## 2019-01-25 DIAGNOSIS — J189 Pneumonia, unspecified organism: Secondary | ICD-10-CM | POA: Diagnosis not present

## 2019-01-25 DIAGNOSIS — R0602 Shortness of breath: Secondary | ICD-10-CM | POA: Diagnosis not present

## 2019-01-25 DIAGNOSIS — M159 Polyosteoarthritis, unspecified: Secondary | ICD-10-CM | POA: Diagnosis present

## 2019-01-25 DIAGNOSIS — J441 Chronic obstructive pulmonary disease with (acute) exacerbation: Secondary | ICD-10-CM | POA: Diagnosis present

## 2019-01-25 DIAGNOSIS — R05 Cough: Secondary | ICD-10-CM | POA: Diagnosis not present

## 2019-01-25 DIAGNOSIS — M810 Age-related osteoporosis without current pathological fracture: Secondary | ICD-10-CM | POA: Diagnosis not present

## 2019-01-25 DIAGNOSIS — Z87891 Personal history of nicotine dependence: Secondary | ICD-10-CM | POA: Diagnosis not present

## 2019-01-25 DIAGNOSIS — E876 Hypokalemia: Secondary | ICD-10-CM | POA: Diagnosis not present

## 2019-01-25 DIAGNOSIS — J111 Influenza due to unidentified influenza virus with other respiratory manifestations: Secondary | ICD-10-CM | POA: Diagnosis present

## 2019-01-25 DIAGNOSIS — E1140 Type 2 diabetes mellitus with diabetic neuropathy, unspecified: Secondary | ICD-10-CM | POA: Diagnosis not present

## 2019-01-25 DIAGNOSIS — K5730 Diverticulosis of large intestine without perforation or abscess without bleeding: Secondary | ICD-10-CM | POA: Diagnosis present

## 2019-01-25 DIAGNOSIS — I959 Hypotension, unspecified: Secondary | ICD-10-CM | POA: Diagnosis not present

## 2019-01-25 DIAGNOSIS — K219 Gastro-esophageal reflux disease without esophagitis: Secondary | ICD-10-CM | POA: Diagnosis not present

## 2019-01-25 DIAGNOSIS — R0902 Hypoxemia: Secondary | ICD-10-CM | POA: Diagnosis not present

## 2019-01-25 LAB — POCT I-STAT EG7
Acid-base deficit: 3 mmol/L — ABNORMAL HIGH (ref 0.0–2.0)
Bicarbonate: 22 mmol/L (ref 20.0–28.0)
Calcium, Ion: 1.14 mmol/L — ABNORMAL LOW (ref 1.15–1.40)
HEMATOCRIT: 34 % — AB (ref 36.0–46.0)
Hemoglobin: 11.6 g/dL — ABNORMAL LOW (ref 12.0–15.0)
O2 Saturation: 88 %
POTASSIUM: 2.9 mmol/L — AB (ref 3.5–5.1)
Sodium: 146 mmol/L — ABNORMAL HIGH (ref 135–145)
TCO2: 23 mmol/L (ref 22–32)
pCO2, Ven: 38 mmHg — ABNORMAL LOW (ref 44.0–60.0)
pH, Ven: 7.371 (ref 7.250–7.430)
pO2, Ven: 56 mmHg — ABNORMAL HIGH (ref 32.0–45.0)

## 2019-01-25 LAB — GLUCOSE, CAPILLARY: Glucose-Capillary: 209 mg/dL — ABNORMAL HIGH (ref 70–99)

## 2019-01-25 LAB — RESPIRATORY PANEL BY PCR
ADENOVIRUS-RVPPCR: NOT DETECTED
Bordetella pertussis: NOT DETECTED
CORONAVIRUS NL63-RVPPCR: NOT DETECTED
Chlamydophila pneumoniae: NOT DETECTED
Coronavirus 229E: NOT DETECTED
Coronavirus HKU1: NOT DETECTED
Coronavirus OC43: NOT DETECTED
Influenza A: NOT DETECTED
Influenza B: NOT DETECTED
Metapneumovirus: NOT DETECTED
Mycoplasma pneumoniae: NOT DETECTED
Parainfluenza Virus 1: NOT DETECTED
Parainfluenza Virus 2: NOT DETECTED
Parainfluenza Virus 3: NOT DETECTED
Parainfluenza Virus 4: NOT DETECTED
Respiratory Syncytial Virus: NOT DETECTED
Rhinovirus / Enterovirus: NOT DETECTED

## 2019-01-25 LAB — BASIC METABOLIC PANEL
Anion gap: 8 (ref 5–15)
BUN: 18 mg/dL (ref 8–23)
CO2: 25 mmol/L (ref 22–32)
CREATININE: 0.68 mg/dL (ref 0.44–1.00)
Calcium: 9.5 mg/dL (ref 8.9–10.3)
Chloride: 105 mmol/L (ref 98–111)
GFR calc Af Amer: >60 mL/min (ref >60)
GFR calc non Af Amer: >60 mL/min (ref >60)
GLUCOSE: 100 mg/dL — AB (ref 70–99)
Potassium: 4.2 mmol/L (ref 3.5–5.1)
SODIUM: 138 mmol/L (ref 135–145)

## 2019-01-25 LAB — CBC WITH DIFFERENTIAL/PLATELET
Abs Immature Granulocytes: 0.02 10*3/uL (ref 0.00–0.07)
Basophils Absolute: 0 10*3/uL (ref 0.0–0.1)
Basophils Relative: 0 %
Eosinophils Absolute: 0.5 10*3/uL (ref 0.0–0.5)
Eosinophils Relative: 4 %
HCT: 40.5 % (ref 36.0–46.0)
Hemoglobin: 12.8 g/dL (ref 12.0–15.0)
Immature Granulocytes: 0 %
Lymphocytes Relative: 52 %
Lymphs Abs: 6.1 10*3/uL — ABNORMAL HIGH (ref 0.7–4.0)
MCH: 29.2 pg (ref 26.0–34.0)
MCHC: 31.6 g/dL (ref 30.0–36.0)
MCV: 92.3 fL (ref 80.0–100.0)
Monocytes Absolute: 0.5 10*3/uL (ref 0.1–1.0)
Monocytes Relative: 4 %
Neutro Abs: 4.8 10*3/uL (ref 1.7–7.7)
Neutrophils Relative %: 40 %
Platelets: 195 10*3/uL (ref 150–400)
RBC: 4.39 MIL/uL (ref 3.87–5.11)
RDW: 14.9 % (ref 11.5–15.5)
WBC: 12 10*3/uL — ABNORMAL HIGH (ref 4.0–10.5)
nRBC: 0 % (ref 0.0–0.2)

## 2019-01-25 LAB — TROPONIN I: Troponin I: <0.03 ng/mL (ref <0.03)

## 2019-01-25 MED ORDER — ALBUTEROL (5 MG/ML) CONTINUOUS INHALATION SOLN
10.0000 mg/h | INHALATION_SOLUTION | Freq: Once | RESPIRATORY_TRACT | Status: AC
  Administered 2019-01-25: 10 mg/h via RESPIRATORY_TRACT
  Filled 2019-01-25: qty 20

## 2019-01-25 MED ORDER — GABAPENTIN 300 MG PO CAPS
300.0000 mg | ORAL_CAPSULE | Freq: Every day | ORAL | Status: DC
  Administered 2019-01-26 – 2019-01-27 (×2): 300 mg via ORAL
  Filled 2019-01-25 (×2): qty 1

## 2019-01-25 MED ORDER — DULOXETINE HCL 30 MG PO CPEP
30.0000 mg | ORAL_CAPSULE | Freq: Every day | ORAL | Status: DC
  Administered 2019-01-26 – 2019-01-27 (×2): 30 mg via ORAL
  Filled 2019-01-25 (×2): qty 1

## 2019-01-25 MED ORDER — PREDNISONE 20 MG PO TABS: 20.0000 mg | ORAL_TABLET | Freq: Two times a day (BID) | ORAL | 0 refills | Status: DC

## 2019-01-25 MED ORDER — LEVALBUTEROL HCL 0.63 MG/3ML IN NEBU
0.6300 mg | INHALATION_SOLUTION | Freq: Four times a day (QID) | RESPIRATORY_TRACT | Status: DC
  Administered 2019-01-25: 0.63 mg via RESPIRATORY_TRACT
  Filled 2019-01-25: qty 3

## 2019-01-25 MED ORDER — SODIUM CHLORIDE 0.9 % IV BOLUS
500.0000 mL | Freq: Once | INTRAVENOUS | Status: AC
  Administered 2019-01-25: 500 mL via INTRAVENOUS

## 2019-01-25 MED ORDER — RAMIPRIL 10 MG PO CAPS
10.0000 mg | ORAL_CAPSULE | Freq: Every day | ORAL | Status: DC
  Administered 2019-01-26 – 2019-01-27 (×2): 10 mg via ORAL
  Filled 2019-01-25 (×2): qty 1

## 2019-01-25 MED ORDER — SODIUM CHLORIDE 0.9% FLUSH
3.0000 mL | Freq: Two times a day (BID) | INTRAVENOUS | Status: DC
  Administered 2019-01-25 – 2019-01-26 (×2): 3 mL via INTRAVENOUS

## 2019-01-25 MED ORDER — INSULIN ASPART 100 UNIT/ML ~~LOC~~ SOLN: 0.0000 [IU] | Freq: Three times a day (TID) | SUBCUTANEOUS | Status: DC

## 2019-01-25 MED ORDER — IPRATROPIUM-ALBUTEROL 0.5-2.5 (3) MG/3ML IN SOLN
3.0000 mL | Freq: Four times a day (QID) | RESPIRATORY_TRACT | Status: DC
  Administered 2019-01-25: 3 mL via RESPIRATORY_TRACT
  Filled 2019-01-25: qty 3

## 2019-01-25 MED ORDER — LUBIPROSTONE 24 MCG PO CAPS
24.0000 ug | ORAL_CAPSULE | Freq: Two times a day (BID) | ORAL | Status: DC
  Administered 2019-01-26 – 2019-01-27 (×2): 24 ug via ORAL
  Filled 2019-01-25 (×3): qty 1

## 2019-01-25 MED ORDER — METOPROLOL TARTRATE 5 MG/5ML IV SOLN: 5.0000 mg | INTRAVENOUS | Status: DC | PRN

## 2019-01-25 MED ORDER — IPRATROPIUM BROMIDE 0.02 % IN SOLN
0.5000 mg | Freq: Three times a day (TID) | RESPIRATORY_TRACT | Status: DC
  Filled 2019-01-25 (×2): qty 2.5

## 2019-01-25 MED ORDER — FUROSEMIDE 20 MG PO TABS
20.0000 mg | ORAL_TABLET | Freq: Every day | ORAL | Status: DC
  Administered 2019-01-26 – 2019-01-27 (×2): 20 mg via ORAL
  Filled 2019-01-25 (×2): qty 1

## 2019-01-25 MED ORDER — LOPERAMIDE HCL 2 MG PO CAPS: 2.0000 mg | ORAL_CAPSULE | Freq: Four times a day (QID) | ORAL | Status: DC | PRN

## 2019-01-25 MED ORDER — POTASSIUM CHLORIDE IN NACL 20-0.9 MEQ/L-% IV SOLN
INTRAVENOUS | Status: AC
  Administered 2019-01-25 – 2019-01-26 (×2): via INTRAVENOUS
  Filled 2019-01-25 (×3): qty 1000

## 2019-01-25 MED ORDER — IPRATROPIUM-ALBUTEROL 0.5-2.5 (3) MG/3ML IN SOLN: 3.0000 mL | RESPIRATORY_TRACT | 0 refills | Status: AC | PRN

## 2019-01-25 MED ORDER — DONEPEZIL HCL 10 MG PO TABS
10.0000 mg | ORAL_TABLET | Freq: Every day | ORAL | Status: DC
  Administered 2019-01-25 – 2019-01-26 (×2): 10 mg via ORAL
  Filled 2019-01-25 (×2): qty 1

## 2019-01-25 MED ORDER — ALBUTEROL SULFATE HFA 108 (90 BASE) MCG/ACT IN AERS: 2.0000 | INHALATION_SPRAY | Freq: Four times a day (QID) | RESPIRATORY_TRACT | Status: DC | PRN

## 2019-01-25 MED ORDER — MEMANTINE HCL 10 MG PO TABS
10.0000 mg | ORAL_TABLET | Freq: Two times a day (BID) | ORAL | Status: DC
  Administered 2019-01-25 – 2019-01-27 (×4): 10 mg via ORAL
  Filled 2019-01-25: qty 1
  Filled 2019-01-25: qty 2
  Filled 2019-01-25 (×2): qty 1
  Filled 2019-01-25 (×2): qty 2
  Filled 2019-01-25: qty 1
  Filled 2019-01-25: qty 2

## 2019-01-25 MED ORDER — PANCRELIPASE (LIP-PROT-AMYL) 36000-114000 UNITS PO CPEP
36000.0000 [IU] | ORAL_CAPSULE | Freq: Three times a day (TID) | ORAL | Status: DC
  Administered 2019-01-26 – 2019-01-27 (×4): 36000 [IU] via ORAL
  Filled 2019-01-25 (×5): qty 1

## 2019-01-25 MED ORDER — DICYCLOMINE HCL 10 MG PO CAPS
10.0000 mg | ORAL_CAPSULE | Freq: Three times a day (TID) | ORAL | Status: DC
  Administered 2019-01-25 – 2019-01-27 (×6): 10 mg via ORAL
  Filled 2019-01-25 (×6): qty 1

## 2019-01-25 MED ORDER — LORAZEPAM 0.5 MG PO TABS
0.2500 mg | ORAL_TABLET | Freq: Two times a day (BID) | ORAL | Status: DC
  Administered 2019-01-25 – 2019-01-27 (×4): 0.25 mg via ORAL
  Filled 2019-01-25 (×4): qty 1

## 2019-01-25 MED ORDER — PANTOPRAZOLE SODIUM 40 MG PO TBEC
80.0000 mg | DELAYED_RELEASE_TABLET | Freq: Every day | ORAL | Status: DC
  Administered 2019-01-26: 80 mg via ORAL
  Filled 2019-01-25: qty 2

## 2019-01-25 MED ORDER — IOHEXOL 350 MG/ML SOLN
80.0000 mL | Freq: Once | INTRAVENOUS | Status: AC | PRN
  Administered 2019-01-25: 49 mL via INTRAVENOUS

## 2019-01-25 MED ORDER — INSULIN ASPART 100 UNIT/ML ~~LOC~~ SOLN
0.0000 [IU] | Freq: Every day | SUBCUTANEOUS | Status: DC
  Administered 2019-01-25: 2 [IU] via SUBCUTANEOUS

## 2019-01-25 MED ORDER — DIVALPROEX SODIUM 250 MG PO DR TAB
375.0000 mg | DELAYED_RELEASE_TABLET | Freq: Three times a day (TID) | ORAL | Status: DC
  Administered 2019-01-25 – 2019-01-27 (×5): 375 mg via ORAL
  Filled 2019-01-25 (×5): qty 1

## 2019-01-25 MED ORDER — LEVALBUTEROL HCL 0.63 MG/3ML IN NEBU: 0.6300 mg | INHALATION_SOLUTION | Freq: Four times a day (QID) | RESPIRATORY_TRACT | Status: DC | PRN

## 2019-01-25 MED ORDER — POTASSIUM CHLORIDE CRYS ER 20 MEQ PO TBCR
20.0000 meq | EXTENDED_RELEASE_TABLET | Freq: Two times a day (BID) | ORAL | Status: DC
  Administered 2019-01-25 – 2019-01-26 (×2): 20 meq via ORAL
  Filled 2019-01-25 (×2): qty 1

## 2019-01-25 MED ORDER — ACETAMINOPHEN 500 MG PO TABS
1000.0000 mg | ORAL_TABLET | Freq: Once | ORAL | Status: AC
  Administered 2019-01-25: 1000 mg via ORAL
  Filled 2019-01-25: qty 2

## 2019-01-25 MED ORDER — AMLODIPINE BESYLATE 10 MG PO TABS
10.0000 mg | ORAL_TABLET | Freq: Every day | ORAL | Status: DC
  Administered 2019-01-26 – 2019-01-27 (×2): 10 mg via ORAL
  Filled 2019-01-25 (×2): qty 1

## 2019-01-25 MED ORDER — HEPARIN SODIUM (PORCINE) 5000 UNIT/ML IJ SOLN
5000.0000 [IU] | Freq: Three times a day (TID) | INTRAMUSCULAR | Status: DC
  Administered 2019-01-25 – 2019-01-27 (×5): 5000 [IU] via SUBCUTANEOUS
  Filled 2019-01-25 (×5): qty 1

## 2019-01-25 MED ORDER — LEVALBUTEROL HCL 0.63 MG/3ML IN NEBU
0.6300 mg | INHALATION_SOLUTION | Freq: Three times a day (TID) | RESPIRATORY_TRACT | Status: DC
  Filled 2019-01-25 (×2): qty 3

## 2019-01-25 MED ORDER — AMOXICILLIN-POT CLAVULANATE 875-125 MG PO TABS
1.0000 | ORAL_TABLET | Freq: Two times a day (BID) | ORAL | Status: DC
  Administered 2019-01-25 – 2019-01-27 (×4): 1 via ORAL
  Filled 2019-01-25 (×4): qty 1

## 2019-01-25 MED ORDER — ATENOLOL 25 MG PO TABS
50.0000 mg | ORAL_TABLET | Freq: Every day | ORAL | Status: DC
  Administered 2019-01-26 – 2019-01-27 (×2): 50 mg via ORAL
  Filled 2019-01-25 (×2): qty 2

## 2019-01-25 MED ORDER — IPRATROPIUM BROMIDE 0.02 % IN SOLN
0.5000 mg | Freq: Four times a day (QID) | RESPIRATORY_TRACT | Status: DC
  Administered 2019-01-25: 0.5 mg via RESPIRATORY_TRACT
  Filled 2019-01-25: qty 2.5

## 2019-01-25 MED ORDER — ATORVASTATIN CALCIUM 10 MG PO TABS
10.0000 mg | ORAL_TABLET | Freq: Every day | ORAL | Status: DC
  Administered 2019-01-26 – 2019-01-27 (×2): 10 mg via ORAL
  Filled 2019-01-25 (×2): qty 1

## 2019-01-25 MED ORDER — METHYLPREDNISOLONE SODIUM SUCC 125 MG IJ SOLR
125.0000 mg | Freq: Once | INTRAMUSCULAR | Status: AC
  Administered 2019-01-25: 125 mg via INTRAVENOUS
  Filled 2019-01-25: qty 2

## 2019-01-25 NOTE — ED Provider Notes (Signed)
I received the patient in signout from Dr. Effie Shy, briefly patient is a 78 year old female with a chief complaint of cough congestion and fever going on for the past week.  Was seen at her nursing home and started on antibiotics.  Has not had improvement.  Family came to visit her today and thought she should come to the ED for evaluation.  Initially was hypoxic per the nursing home though has not been hypoxic here.  She had had significant improvement with breathing treatments and a continuous nebulizer and the plan was to discharge her home.  The nurse noted as she was discharging the patient that her heart rate had increased significantly and was in the 110s upon arrival and now is in the 140s.  Patient seems to be asymptomatic from this though she is slightly tachypneic.  Feels warm to the touch for me has continued wheezing diffusely.  EKG shows sinus tachycardia.  We will give a bolus of fluids if she clinically to me appears to be a little bit dehydrated.  We will give a dose of Tylenol for possible fever though at bedside on oral temp was 99.  Will discuss with the hospitalist for possible admission.   Melene Plan, DO 01/25/19 2212

## 2019-01-25 NOTE — ED Provider Notes (Addendum)
[facility] EMERGENCY DEPARTMENT Provider Note   CSN: [id number] Arrival date & time: 01/25/19  1335    History   Chief Complaint Chief Complaint  Patient presents with   Shortness of Breath   Cough    HPI Amanda Adkins is a 78 y.o. female.     HPI   She presents for evaluation of cough persistent for 1 week despite taking antibiotics for "pneumonia."  She is reported to be hypoxic at facility however when EMS got there she had a normal O2 saturation, on room air.  The patient has dementia and cannot give much history.  Family members are here with her when I evaluated the patient.  They report that she is alert, but has dementia, and appears at her baseline mentally.  They have not seen her since last week when she began to be ill.  Level 5 caveat-dementia  Past Medical History:  Diagnosis Date   Abdominal pain, generalized    Abdominal pain, generalized    Abdominal pain, unspecified site    Abnormality of gait    Abnormality of gait    Abnormality of gait    Acute bronchitis    Acute sinusitis, unspecified    Allergic rhinitis due to pollen    Allergic rhinitis due to pollen    Allergy    Anxiety    Anxiety state, unspecified    Candidiasis of vulva and vagina    Cervicitis and endocervicitis    Chronic pain of both shoulders    Closed dislocation of shoulder, unspecified site    Constipation, chronic    Contact with or exposure to other communicable diseases(V01.89)    Cough    Dementia in conditions classified elsewhere without behavioral disturbance    Dermatitis    Diabetes mellitus without complication (HCC)    Diarrhea    Diarrhea    Diverticulosis of colon (without mention of hemorrhage)    GERD (gastroesophageal reflux disease)    Hearing loss sensory, bilateral    Hepatomegaly    Hepatomegaly    History of fall    Hypercalcemia    Hyperlipidemia    Hypertension    Hypopotassemia     Impacted cerumen    Kyphosis (acquired) (postural)    Leukocytosis, unspecified    Muscle weakness (generalized)    Osteoarthritis of both knees    Osteoporosis    Other and unspecified hyperlipidemia    Other B-complex deficiencies    Other malaise and fatigue    Other malaise and fatigue    Other psoriasis    Other specified pre-operative examination    Pain    Pain in joint, lower leg    Pain in joint, shoulder region    Pain in joint, site unspecified    Personal history of fall    Posttraumatic stress disorder    Posttraumatic stress disorder    Psoriasis    Reflux esophagitis    Senile dementia, uncomplicated (HCC)    Senile osteoporosis    Senile osteoporosis    Shoulder dislocation    Stiffness of joint, not elsewhere classified, unspecified site    Tinnitus    Tobacco abuse    Tobacco use disorder    Tobacco use disorder    Type I (juvenile type) diabetes mellitus without mention of complication, not stated as uncontrolled    Type II or unspecified type diabetes mellitus without mention of complication, not stated as uncontrolled    Type II or unspecified  type diabetes mellitus without mention of complication, uncontrolled    Unspecified constipation    Unspecified essential hypertension    Unspecified pruritic disorder    Vascular dementia (HCC)    Vitamin B12 deficiency     Patient Active Problem List   Diagnosis Date Noted   Candidal skin infection 02/02/2014   Psychosis, 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/02/2014   Pancreatic insufficiency 02/02/2014   GERD (gastroesophageal reflux disease) 06/16/2013   Weakness generalized 06/09/2013   Senile dementia with paranoia without behavioral disturbance (HCC) 05/19/2013   Left patella fracture 04/07/2013   Essential hypertension, benign    Stiffness of joint, not elsewhere classified,  unspecified site    Pain in joint, site unspecified    Chronic pain of both shoulders    Tobacco abuse    Osteoarthritis of both knees    Shoulder dislocation    Vascular dementia (HCC)    DYSPNEA 09/24/2009    Past Surgical History:  Procedure Laterality Date   BLADDER SURGERY     ORIF PATELLA Left 04/08/2013   Procedure: OPEN REDUCTION INTERNAL (ORIF) FIXATION PATELLA;  Surgeon: Eldred Manges, MD;  Location: [facility] OR;  Service: Orthopedics;  Laterality: Left;  Open Reduction Internal Fixation Left Patella Fracture   SPINE SURGERY     tubual ligation       OB History   No obstetric history on file.      Home Medications    Prior to Admission medications   Medication Sig Start Date End Date Taking? Authorizing Provider  acetaminophen (TYLENOL) 500 MG tablet Take 1,000 mg by mouth 3 (three) times daily as needed for mild pain.    Yes [provider]  albuterol (PROVENTIL HFA;VENTOLIN HFA) 108 (90 Base) MCG/ACT inhaler Inhale 2 puffs into the lungs every 6 (six) hours as needed for wheezing or shortness of breath.   Yes [provider]  amLODipine (NORVASC) 10 MG tablet Take 10 mg by mouth daily.   Yes [provider]  amoxicillin-clavulanate (AUGMENTIN) 875-125 MG tablet Take 1 tablet by mouth 2 (two) times daily. Start 01-21-19 Ends 01-28-19   Yes [provider]  atenolol (TENORMIN) 50 MG tablet Take 50 mg by mouth daily.   Yes [provider]  atorvastatin (LIPITOR) 10 MG tablet Take 10 mg by mouth daily.   Yes [provider]  dextromethorphan-guaiFENesin (ROBAFEN DM [facility]/CHEST CONGEST) 10-100 MG/5ML liquid Take 15 mLs by mouth 3 (three) times daily. For 7 days Starts on 01-20-19 Ends on 01-27-19   Yes [provider]  dicyclomine (BENTYL) 10 MG capsule Take 10 mg by mouth 4 (four) times daily -  before meals and at bedtime.   Yes [provider]  divalproex (DEPAKOTE) 125 MG DR tablet Take 375 mg by  mouth 3 (three) times daily.    Yes [provider]  donepezil (ARICEPT) 10 MG tablet Take 10 mg by mouth at bedtime.   Yes [provider]  DULoxetine (CYMBALTA) 30 MG capsule Take 30 mg by mouth daily.   Yes [provider]  esomeprazole (NEXIUM) 40 MG capsule Take 40 mg by mouth daily before breakfast.   Yes [provider]  furosemide (LASIX) 20 MG tablet Take 20 mg by mouth daily.   Yes [provider]  gabapentin (NEURONTIN) 300 MG capsule Take 300 mg by mouth daily.   Yes [provider]  hydrocortisone cream 1 % Apply 1 application  topically 2 (two) times daily as needed for itching (to buttocks).   Yes [provider]  lipase/protease/amylase (CREON) 36000 UNITS CPEP capsule Take 36,000 Units by mouth 3 (three) times daily before meals.   Yes [provider]  loperamide (IMODIUM) 2 MG capsule Take 2 mg by mouth 4 (four) times daily as needed for diarrhea or loose stools.   Yes [provider]  LORazepam (ATIVAN) 0.5 MG tablet Take 0.25 mg by mouth 2 (two) times daily.   Yes [provider]  lubiprostone (AMITIZA) 24 MCG capsule Take 24 mcg by mouth 2 (two) times daily with a meal.   Yes [provider]  memantine (NAMENDA) 10 MG tablet Take 10 mg by mouth 2 (two) times daily.   Yes [provider]  potassium chloride (KLOR-CON) 20 MEQ packet Take 20 mEq by mouth daily.   Yes [provider]  ramipril (ALTACE) 10 MG capsule Take 10 mg by mouth daily.   Yes [provider]  Vitamin D, Ergocalciferol, (DRISDOL) 1.25 MG (50000 UT) CAPS capsule Take 50,000 Units by mouth See admin instructions. MON and THUR   Yes [provider]  ipratropium (ATROVENT HFA) 17 MCG/ACT inhaler Inhale 2 puffs into the lungs every 6 (six) hours as needed for wheezing. Patient not taking: Reported on 01/25/2019 02/01/16   Renato Gails, Tiffany L, DO  ipratropium-albuterol (DUONEB) 0.5-2.5 (3)  MG/3ML SOLN Take 3 mLs by nebulization every 4 (four) hours as needed (SOB). Ends 01-27-19 01/25/19   Mancel Bale, MD  predniSONE (DELTASONE) 20 MG tablet Take 1 tablet (20 mg total) by mouth 2 (two) times daily. 01/25/19   Mancel Bale, MD    Family History No family history on file.  Social History Social History   Tobacco Use   Smoking status: Former Smoker    Packs/day: 1.00    Years: 40.00    Pack years: 40.00    Types: Cigarettes   Smokeless tobacco: Former Neurosurgeon    Quit date: 05/03/2013  Substance Use Topics   Alcohol use: No   Drug use: No     Allergies   Buspar [buspirone]; Percocet [oxycodone-acetaminophen]; and Vicodin [hydrocodone-acetaminophen]   Review of Systems Review of Systems  Unable to perform ROS: Dementia     Physical Exam Updated Vital Signs BP 112/80    Pulse (!) 106    Temp 99.1 F (37.3 C) (Rectal)    Resp 19    SpO2 91%   Physical Exam Vitals signs and nursing note reviewed.  Constitutional:      General: She is not in acute distress.    Appearance: She is not ill-appearing or diaphoretic.     Comments: Elderly, frail  HENT:     Head: Normocephalic and atraumatic.     Right Ear: External ear normal.     Left Ear: External ear normal.     Mouth/Throat:     Mouth: Mucous membranes are moist.     Pharynx: No oropharyngeal exudate or posterior oropharyngeal erythema.  Eyes:     Conjunctiva/sclera: Conjunctivae normal.     Pupils: Pupils are equal, round, and reactive to light.  Neck:     Musculoskeletal: Normal range of motion and neck supple.     Trachea: Phonation normal.  Cardiovascular:     Rate and Rhythm: Normal rate and regular rhythm.     Heart sounds: Normal heart sounds.     Comments: Nonproductive cough Pulmonary:     Effort: Pulmonary effort is normal.  Comments: Decreased air movement bilaterally, with a few scattered rhonchi.  No wheezes.  No increased work of breathing. Chest:     Chest wall: No tenderness.   Abdominal:     Palpations: Abdomen is soft.     Tenderness: There is no abdominal tenderness.  Musculoskeletal: Normal range of motion.        General: No swelling, tenderness or signs of injury.  Skin:    General: Skin is warm and dry.  Neurological:     Mental Status: She is alert and oriented to person, place, and time.     Cranial Nerves: No cranial nerve deficit.     Sensory: No sensory deficit.     Motor: No abnormal muscle tone.     Coordination: Coordination normal.  Psychiatric:        Behavior: Behavior normal.        Thought Content: Thought content normal.        Judgment: Judgment normal.      ED Treatments / Results  Labs (all labs ordered are listed, but only abnormal results are displayed) Labs Reviewed  BASIC METABOLIC PANEL - Abnormal; Notable for the following components:      Result Value   Glucose, Bld 100 (*)    All other components within normal limits  CBC WITH DIFFERENTIAL/PLATELET - Abnormal; Notable for the following components:   WBC 12.0 (*)    Lymphs Abs 6.1 (*)    All other components within normal limits  POCT I-STAT EG7 - Abnormal; Notable for the following components:   pCO2, Ven 38.0 (*)    pO2, Ven 56.0 (*)    Acid-base deficit 3.0 (*)    Sodium 146 (*)    Potassium 2.9 (*)    Calcium, Ion 1.14 (*)    HCT 34.0 (*)    Hemoglobin 11.6 (*)    All other components within normal limits  BLOOD GAS, VENOUS    EKG EKG Interpretation  Date/Time:  Tuesday January 25 2019 13:36:40 EDT Ventricular Rate:  114 PR Interval:    QRS Duration: 95 QT Interval:  331 QTC Calculation: 456 R Axis:   72 Text Interpretation:  Sinus tachycardia Multiple premature complexes, vent & supraven Consider left atrial enlargement Since last tracing rate faster Confirmed by Mancel Bale [id number]) on 01/25/2019 3:37:09 PM   Radiology Ct Angio Chest Pe W/cm &/or Wo Cm  Result Date: 01/25/2019 CLINICAL DATA:  Shortness of breath starting today. EXAM: CT  ANGIOGRAPHY CHEST WITH CONTRAST TECHNIQUE: Multidetector CT imaging of the chest was performed using the standard protocol during bolus administration of intravenous contrast. Multiplanar CT image reconstructions and MIPs were obtained to evaluate the vascular anatomy. CONTRAST:  49mL OMNIPAQUE IOHEXOL 350 MG/ML SOLN COMPARISON:  Chest x-ray January 25, 2019 FINDINGS: Cardiovascular: Satisfactory opacification of the pulmonary arteries to the segmental level. No evidence of pulmonary embolism. Normal heart size. No pericardial effusion. Mediastinum/Nodes: There are small mediastinal and bilateral hilar lymph nodes. There is peribronchial thickening of bilateral lungs of all lobes. The trachea is patent. The esophagus is unremarkable. There is a small hiatal hernia. Lungs/Pleura: There are least 3 nodules in the left lower lobe, largest measures 1.4 cm. There is no pleural effusion or pleural effusion. No definite focal consolidation is identified. Upper Abdomen: Hypertrophy of bilateral adrenal glands are nonspecific. The other visualized upper abdominal structures are unremarkable. Musculoskeletal: Degenerative joint changes of the spine are noted. Chronic compression deformity of the lower thoracic or upper lumbar vertebral bodies  noted. Review of the MIP images confirms the above findings. IMPRESSION: No pulmonary embolus. Mild peribronchial thickening of all lobes. This is nonspecific but can be seen in bronchitis. At least 3 nodules in the left lower lobe, largest measures 1.4 cm. This is nonspecific. Consider further evaluation with PET CT on outpatient basis to exclude neoplasm. Electronically Signed   By: Sherian Rein M.D.   On: 01/25/2019 16:13   Dg Chest Port 1 View  Result Date: 01/25/2019 CLINICAL DATA:  78 year old female with cough for a few days. EXAM: PORTABLE CHEST 1 VIEW COMPARISON:  12/13/2016 chest radiographs and earlier. FINDINGS: Portable AP semi upright view at 1347 hours. Stable lung  volumes and mediastinal contours with mildly tortuous aorta. Calcified aortic atherosclerosis. Visualized tracheal air column is within normal limits. There is an indistinct 8 millimeter nodular opacity projecting in the left lung (arrow). Otherwise allowing for portable technique the lungs are clear. No pulmonary edema or pneumothorax. No acute osseous abnormality identified. Negative visible bowel gas pattern. IMPRESSION: 1. Questionable small mid left lung nodule versus artifact. 2. No other acute cardiopulmonary abnormality. Electronically Signed   By: Odessa Fleming M.D.   On: 01/25/2019 13:59    Procedures .Critical Care Performed by: Mancel Bale, MD Authorized by: Mancel Bale, MD   Critical care provider statement:    Critical care time (minutes):  35   Critical care start time:  01/25/2019 1:40 PM   Critical care end time:  01/25/2019 4:11 PM   Critical care time was exclusive of:  Separately billable procedures and treating other patients   Critical care was necessary to treat or prevent imminent or life-threatening deterioration of the following conditions:  Respiratory failure   Critical care was time spent personally by me on the following activities:  Blood draw for specimens, development of treatment plan with patient or surrogate, discussions with consultants, evaluation of patient's response to treatment, examination of patient, obtaining history from patient or surrogate, ordering and performing treatments and interventions, ordering and review of laboratory studies, pulse oximetry, re-evaluation of patient's condition, review of old charts and ordering and review of radiographic studies   (including critical care time)  Medications Ordered in ED Medications  ipratropium-albuterol (DUONEB) 0.5-2.5 (3) MG/3ML nebulizer solution 3 mL (3 mLs Nebulization Given 01/25/19 1513)  methylPREDNISolone sodium succinate (SOLU-MEDROL) 125 mg/2 mL injection 125 mg (125 mg Intravenous Given  01/25/19 1515)  iohexol (OMNIPAQUE) 350 MG/ML injection 80 mL (49 mLs Intravenous Contrast Given 01/25/19 1558)  albuterol (PROVENTIL,VENTOLIN) solution continuous neb (10 mg/hr Nebulization Given 01/25/19 1609)     Initial Impression / Assessment and Plan / ED Course  I have reviewed the triage vital signs and the nursing notes.  Pertinent labs & imaging results that were available during my care of the patient were reviewed by me and considered in my medical decision making (see chart for details).  Clinical Course as of Jan 24 1706  Tue Jan 25, 2019  1655 No PE or significant infiltrate, images reviewed by me  CT Angio Chest PE W/Cm &/Or Wo Cm [EW]  1656 Findings discussed with patient's family members, granddaughter and friend, all questions answered.  Is comfortable at this time.  She remains tachycardic while on nebulizer.   [EW]    Clinical Course User Index [EW] Mancel Bale, MD        Patient Vitals for the past 24 hrs:  BP Temp Temp src Pulse Resp SpO2  01/25/19 1609 -- -- -- (!) 106 19 --  01/25/19 1530 112/80 -- -- (!) 113 (!) 29 91 %  01/25/19 1500 138/83 -- -- (!) 115 -- 91 %  01/25/19 1342 116/78 99.1 F (37.3 C) Rectal (!) 117 (!) 24 98 %  01/25/19 1341 -- -- -- -- -- 98 %    4:00 PM Reevaluation with update and discussion. After initial assessment and treatment, an updated evaluation reveals she is still short of breath, and would like to try some additional nebulizer treatment.  Patient family updated on findings and plan. Mancel BaleElliott Arwin Bisceglia   Medical Decision Making: Ongoing cough for 1 week being treated with biotic for pneumonia.  No fever and no cold indicators for sepsis.  Doubt viral pneumonia, worsening bacterial pneumonia or metabolic instability.  Suspect COPD exacerbation.  CT imaging ordered to rule out occult pulmonary process.  CRITICAL CARE-yes Performed by: Mancel BaleElliott Flonnie Wierman  Nursing Notes Reviewed/ Care Coordinated Applicable Imaging  Reviewed Interpretation of Laboratory Data incorporated into ED treatment  The patient appears reasonably screened and/or stabilized for discharge and I doubt any other medical condition or other [facility] requiring further screening, evaluation, or treatment in the ED at this time prior to discharge.  Plan: Home Medications-continue usual; Home Treatments-rest, fluids; return here if the recommended treatment, does not improve the symptoms; Recommended follow up-PCP, PRN      Final Clinical Impressions(s) / ED Diagnoses   Final diagnoses:  COPD exacerbation [facility]HCC)    ED Discharge Orders         Ordered    predniSONE (DELTASONE) 20 MG tablet  2 times daily     01/25/19 1706    ipratropium-albuterol (DUONEB) 0.5-2.5 (3) MG/3ML SOLN  Every 4 hours PRN     01/25/19 1706             Mancel BaleWentz, Aretta Stetzel, MD 01/25/19 1708

## 2019-01-25 NOTE — Discharge Instructions (Addendum)
Test indicate that you have bronchitis today.  Continue using the DuoNeb nebulizer every 4 hours as needed for cough or trouble breathing.  Start the prednisone prescription tomorrow morning.  Return here, if needed, for problems.

## 2019-01-25 NOTE — ED Notes (Signed)
ED TO INPATIENT HANDOFF REPORT  ED Nurse Name and Phone #: Okla Qazi [phone]  S Name/Age/Gender Amanda Adkins 78 y.o. female Room/Bed: 046C/046C  Code Status   Code Status: Full Code  Home/SNF/Other Nursing Home Patient oriented to: self, place, time and situation Is this baseline? Yes   Triage Complete: Triage complete  Chief Complaint SOB  Triage Note Pt brought in by ems from [facility] for low 02 saturations , facility reports that her 02 saturations were high 80's so they placed her on 3L; facility also reports  Patient has had a Non productive cough and SOB;  upon ems arrival , patients 02 saturations were above 95% on RA ; pt alert and oriented x 2 ; 98 % on RA at this time ; facility reports that she was recently diagnosed with PNA and has been taking antibiotics    Allergies Allergies  Allergen Reactions  . Buspar [Buspirone] Hives  . Percocet [Oxycodone-Acetaminophen] Other (See Comments)    Hallucinations   . Vicodin [Hydrocodone-Acetaminophen] Other (See Comments)    Hallucinations     Level of Care/Admitting Diagnosis ED Disposition    ED Disposition Condition Comment   Admit  Hospital Area: [facility] [[id number]]  Level of Care: Telemetry Cardiac [103]  Diagnosis: SVT (supraventricular tachycardia) [facility]) [[id number]]  Admitting Physician: Lahoma Crocker [[id number]]  Attending Physician: Lahoma Crocker [[id number]]  Estimated length of stay: 3 - 4 days  Certification:: I certify this patient will need inpatient services for at least 2 midnights  PT Class (Do Not Modify): Inpatient [101]  PT Acc Code (Do Not Modify): Private [1]       B Medical/Surgery History Past Medical History:  Diagnosis Date  . Abdominal pain, generalized   . Abdominal pain, generalized   . Abdominal pain, unspecified site   . Abnormality of gait   . Abnormality of gait   . Abnormality of gait   . Acute bronchitis   . Acute sinusitis, unspecified    . Allergic rhinitis due to pollen   . Allergic rhinitis due to pollen   . Allergy   . Anxiety   . Anxiety state, unspecified   . Candidiasis of vulva and vagina   . Cervicitis and endocervicitis   . Chronic pain of both shoulders   . Closed dislocation of shoulder, unspecified site   . Constipation, chronic   . Contact with or exposure to other communicable diseases(V01.89)   . Cough   . Dementia in conditions classified elsewhere without behavioral disturbance   . Dermatitis   . Diabetes mellitus without complication (HCC)   . Diarrhea   . Diarrhea   . Diverticulosis of colon (without mention of hemorrhage)   . GERD (gastroesophageal reflux disease)   . Hearing loss sensory, bilateral   . Hepatomegaly   . Hepatomegaly   . History of fall   . Hypercalcemia   . Hyperlipidemia   . Hypertension   . Hypopotassemia   . Impacted cerumen   . Kyphosis (acquired) (postural)   . Leukocytosis, unspecified   . Muscle weakness (generalized)   . Osteoarthritis of both knees   . Osteoporosis   . Other and unspecified hyperlipidemia   . Other B-complex deficiencies   . Other malaise and fatigue   . Other malaise and fatigue   . Other psoriasis   . Other specified pre-operative examination   . Pain   . Pain in joint, lower leg   . Pain in joint,  shoulder region   . Pain in joint, site unspecified   . Personal history of fall   . Posttraumatic stress disorder   . Posttraumatic stress disorder   . Psoriasis   . Reflux esophagitis   . Senile dementia, uncomplicated (HCC)   . Senile osteoporosis   . Senile osteoporosis   . Shoulder dislocation   . Stiffness of joint, not elsewhere classified, unspecified site   . Tinnitus   . Tobacco abuse   . Tobacco use disorder   . Tobacco use disorder   . 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 Type II or unspecified type diabetes mellitus without mention of complication, not  stated as uncontrolled   . Type II or unspecified type diabetes mellitus without mention of complication, uncontrolled   . Unspecified constipation   . Unspecified essential hypertension   . Unspecified pruritic disorder   . Vascular dementia (HCC)   . Vitamin B12 deficiency    Past Surgical History:  Procedure Laterality Date  . BLADDER SURGERY    . ORIF PATELLA Left 04/08/2013   Procedure: OPEN REDUCTION INTERNAL (ORIF) FIXATION PATELLA;  Surgeon: Eldred Manges, MD;  Location: [facility] OR;  Service: Orthopedics;  Laterality: Left;  Open Reduction Internal Fixation Left Patella Fracture  . SPINE SURGERY    . tubual ligation       A IV Location/Drains/Wounds Patient Lines/Drains/Airways Status   Active Line/Drains/Airways    Name:   Placement date:   Placement time:   Site:   Days:   Peripheral IV 01/25/19 Right Antecubital   01/25/19    1357    Antecubital   less than 1   Incision 04/08/13 Knee Left   04/08/13    2146     2118          Intake/Output Last 24 hours No intake or output data in the 24 hours ending 01/25/19 1846  Labs/Imaging Results for orders placed or performed during the hospital encounter of 01/25/19 (from the past 48 hour(s))  Basic metabolic panel     Status: Abnormal   Collection Time: 01/25/19  1:52 PM  Result Value Ref Range   Sodium 138 135 - 145 mmol/L   Potassium 4.2 3.5 - 5.1 mmol/L   Chloride 105 98 - 111 mmol/L   CO2 25 22 - 32 mmol/L   Glucose, Bld 100 (H) 70 - 99 mg/dL   BUN 18 8 - 23 mg/dL   Creatinine, Ser 1.61 0.44 - 1.00 mg/dL   Calcium 9.5 8.9 - 09.6 mg/dL   GFR calc non Af Amer >60 >60 mL/min   GFR calc Af Amer >60 >60 mL/min   Anion gap 8 5 - 15    Comment: Performed at [facility], [address] [place], [place] [address]  CBC with Differential     Status: Abnormal   Collection Time: 01/25/19  1:52 PM  Result Value Ref Range   WBC 12.0 (H) 4.0 - 10.5 K/uL   RBC 4.39 3.87 - 5.11 MIL/uL   Hemoglobin 12.8 12.0 - 15.0 g/dL    HCT 98.1 19.1 - 47.8 %   MCV 92.3 80.0 - 100.0 fL   MCH 29.2 26.0 - 34.0 pg   MCHC 31.6 30.0 - 36.0 g/dL   RDW 29.5 62.1 - 30.8 %   Platelets 195 150 - 400 K/uL   nRBC 0.0 0.0 - 0.2 %   Neutrophils Relative %  40 %   Neutro Abs 4.8 1.7 - 7.7 K/uL   Lymphocytes Relative 52 %   Lymphs Abs 6.1 (H) 0.7 - 4.0 K/uL   Monocytes Relative 4 %   Monocytes Absolute 0.5 0.1 - 1.0 K/uL   Eosinophils Relative 4 %   Eosinophils Absolute 0.5 0.0 - 0.5 K/uL   Basophils Relative 0 %   Basophils Absolute 0.0 0.0 - 0.1 K/uL   Immature Granulocytes 0 %   Abs Immature Granulocytes 0.02 0.00 - 0.07 K/uL    Comment: Performed at [facility], [address], [place], [place] [address]  POCT I-Stat EG7     Status: Abnormal   Collection Time: 01/25/19  2:13 PM  Result Value Ref Range   pH, Ven 7.371 7.250 - 7.430   pCO2, Ven 38.0 (L) 44.0 - 60.0 mmHg   pO2, Ven 56.0 (H) 32.0 - 45.0 mmHg   Bicarbonate 22.0 20.0 - 28.0 mmol/L   TCO2 23 22 - 32 mmol/L   O2 Saturation 88.0 %   Acid-base deficit 3.0 (H) 0.0 - 2.0 mmol/L   Sodium 146 (H) 135 - 145 mmol/L   Potassium 2.9 (L) 3.5 - 5.1 mmol/L   Calcium, Ion 1.14 (L) 1.15 - 1.40 mmol/L   HCT 34.0 (L) 36.0 - 46.0 %   Hemoglobin 11.6 (L) 12.0 - 15.0 g/dL   Patient temperature HIDE    Sample type VENOUS    Ct Angio Chest Pe W/cm &/or Wo Cm  Result Date: 01/25/2019 CLINICAL DATA:  Shortness of breath starting today. EXAM: CT ANGIOGRAPHY CHEST WITH CONTRAST TECHNIQUE: Multidetector CT imaging of the chest was performed using the standard protocol during bolus administration of intravenous contrast. Multiplanar CT image reconstructions and MIPs were obtained to evaluate the vascular anatomy. CONTRAST:  76mL OMNIPAQUE IOHEXOL 350 MG/ML SOLN COMPARISON:  Chest x-ray January 25, 2019 FINDINGS: Cardiovascular: Satisfactory opacification of the pulmonary arteries to the segmental level. No evidence of pulmonary embolism. Normal heart size. No pericardial effusion.  Mediastinum/Nodes: There are small mediastinal and bilateral hilar lymph nodes. There is peribronchial thickening of bilateral lungs of all lobes. The trachea is patent. The esophagus is unremarkable. There is a small hiatal hernia. Lungs/Pleura: There are least 3 nodules in the left lower lobe, largest measures 1.4 cm. There is no pleural effusion or pleural effusion. No definite focal consolidation is identified. Upper Abdomen: Hypertrophy of bilateral adrenal glands are nonspecific. The other visualized upper abdominal structures are unremarkable. Musculoskeletal: Degenerative joint changes of the spine are noted. Chronic compression deformity of the lower thoracic or upper lumbar vertebral bodies noted. Review of the MIP images confirms the above findings. IMPRESSION: No pulmonary embolus. Mild peribronchial thickening of all lobes. This is nonspecific but can be seen in bronchitis. At least 3 nodules in the left lower lobe, largest measures 1.4 cm. This is nonspecific. Consider further evaluation with PET CT on outpatient basis to exclude neoplasm. Electronically Signed   By: Sherian Rein M.D.   On: 01/25/2019 16:13   Dg Chest Port 1 View  Result Date: 01/25/2019 CLINICAL DATA:  78 year old female with cough for a few days. EXAM: PORTABLE CHEST 1 VIEW COMPARISON:  12/13/2016 chest radiographs and earlier. FINDINGS: Portable AP semi upright view at 1347 hours. Stable lung volumes and mediastinal contours with mildly tortuous aorta. Calcified aortic atherosclerosis. Visualized tracheal air column is within normal limits. There is an indistinct 8 millimeter nodular opacity projecting in the left lung (arrow). Otherwise allowing for portable technique  the lungs are clear. No pulmonary edema or pneumothorax. No acute osseous abnormality identified. Negative visible bowel gas pattern. IMPRESSION: 1. Questionable small mid left lung nodule versus artifact. 2. No other acute cardiopulmonary abnormality.  Electronically Signed   By: Odessa Fleming M.D.   On: 01/25/2019 13:59    Pending Labs Unresulted Labs (From admission, onward)    Start     Ordered   01/26/19 0500  Basic metabolic panel  Tomorrow morning,   R     01/25/19 1824   01/26/19 0500  CBC  Tomorrow morning,   R     01/25/19 1824   01/26/19 0500  Magnesium  Tomorrow morning,   R     01/25/19 1824   01/25/19 1833  Troponin I - Now Then Q6H  Now then every 6 hours,   R     01/25/19 1832   01/25/19 1821  CBC  (heparin)  Once,   R    Comments:  Baseline for heparin therapy IF NOT ALREADY DRAWN.  Notify MD if PLT < 100 K.    01/25/19 1824   01/25/19 1821  Creatinine, serum  (heparin)  Once,   R    Comments:  Baseline for heparin therapy IF NOT ALREADY DRAWN.    01/25/19 1824   01/25/19 1803  Respiratory Panel by PCR  (Respiratory virus panel with precautions)  Once,   R     01/25/19 1802          Vitals/Pain Today's Vitals   01/25/19 1530 01/25/19 1609 01/25/19 1630 01/25/19 1739  BP: 112/80  116/68 111/66  Pulse: (!) 113 (!) 106 (!) 102 (!) 147  Resp: (!) 29 19 (!) 24 (!) 26  Temp:      TempSrc:      SpO2: 91%  100% 94%  PainSc:        Isolation Precautions Droplet precaution  Medications Medications  amoxicillin-clavulanate (AUGMENTIN) 875-125 MG per tablet 1 tablet (has no administration in time range)  amLODipine (NORVASC) tablet 10 mg (has no administration in time range)  atenolol (TENORMIN) tablet 50 mg (has no administration in time range)  atorvastatin (LIPITOR) tablet 10 mg (has no administration in time range)  furosemide (LASIX) tablet 20 mg (has no administration in time range)  ramipril (ALTACE) capsule 10 mg (has no administration in time range)  donepezil (ARICEPT) tablet 10 mg (has no administration in time range)  DULoxetine (CYMBALTA) DR capsule 30 mg (has no administration in time range)  LORazepam (ATIVAN) tablet 0.25 mg (has no administration in time range)  memantine (NAMENDA) tablet 10 mg  (has no administration in time range)  dicyclomine (BENTYL) capsule 10 mg (has no administration in time range)  pantoprazole (PROTONIX) EC tablet 80 mg (has no administration in time range)  lipase/protease/amylase (CREON) capsule 36,000 Units (has no administration in time range)  loperamide (IMODIUM) capsule 2 mg (has no administration in time range)  lubiprostone (AMITIZA) capsule 24 mcg (has no administration in time range)  divalproex (DEPAKOTE) DR tablet 375 mg (has no administration in time range)  gabapentin (NEURONTIN) capsule 300 mg (has no administration in time range)  heparin injection 5,000 Units (has no administration in time range)  sodium chloride flush (NS) 0.9 % injection 3 mL (has no administration in time range)  0.9 % NaCl with KCl 20 mEq/ L  infusion (has no administration in time range)  ipratropium (ATROVENT) nebulizer solution 0.5 mg (has no administration in time range)  levalbuterol (XOPENEX)  nebulizer solution 0.63 mg (has no administration in time range)  levalbuterol (XOPENEX) nebulizer solution 0.63 mg (has no administration in time range)  metoprolol tartrate (LOPRESSOR) injection 5 mg (has no administration in time range)  methylPREDNISolone sodium succinate (SOLU-MEDROL) 125 mg/2 mL injection 125 mg (125 mg Intravenous Given 01/25/19 1515)  iohexol (OMNIPAQUE) 350 MG/ML injection 80 mL (49 mLs Intravenous Contrast Given 01/25/19 1558)  albuterol (PROVENTIL,VENTOLIN) solution continuous neb (10 mg/hr Nebulization Given 01/25/19 1609)  acetaminophen (TYLENOL) tablet 1,000 mg (1,000 mg Oral Given 01/25/19 1751)  sodium chloride 0.9 % bolus 500 mL (500 mLs Intravenous New Bag/Given 01/25/19 1757)    Mobility non-ambulatory High fall risk   Focused Assessments Cardiac Assessment Handoff:  Cardiac Rhythm: Sinus tachycardia Lab Results  Component Value Date   CKTOTAL 302 (H) 06/22/2012   No results found for: DDIMER Does the Patient currently have chest  pain? No     R Recommendations: See Admitting Provider Note  Report given to:   Additional Notes:

## 2019-01-25 NOTE — ED Triage Notes (Addendum)
Pt brought in by ems from [facility] for low 02 saturations , facility reports that her 02 saturations were high 80's so they placed her on 3L; facility also reports  Patient has had a Non productive cough and SOB;  upon ems arrival , patients 02 saturations were above 95% on RA ; pt alert and oriented x 2 ; 98 % on RA at this time ; facility reports that she was recently diagnosed with PNA and has been taking antibiotics

## 2019-01-25 NOTE — H&P (Signed)
History and Physical    ZILAH VILLAFLOR ZOX:[id number] DOB: 07/23/1941 DOA: 01/25/2019  PCP: Kermit Balo, DO  Patient coming from: Home  I have personally briefly reviewed patient's old medical records in [facility]  Chief Complaint: Shortness of breath and cough  HPI: GOLDIA LIGMAN is a 78 y.o. female with medical history significant of vascular dementia, abnormal gait, chronic pain of shoulders, diabetes type 2, constipation chronic, osteoporosis, diagnosed with pneumonia last week at nursing facility who presents today with complaints of cough and shortness of breath.  She has been taking antibiotics for pneumonia and remains hypoxic at the facility.  When EMS arrived she had a normal O2 sat and was on room air.  He is demented and cannot give much history but family members state she is dentally at her baseline.  Fortunately due to coronavirus lockdown family has not been able to see her since last week and she was diagnosed with her pneumonia on Tuesday of last week when I saw her today they felt that she was more ill. ED Course: CTA of the chest did not show any PE, there was some bronchitis, 3 lung nodules.  Patient to be admitted to the hospital for SVT and evaluation of flulike illness.  Review of Systems: 5 caveat for dementia  Past Medical History:  Diagnosis Date  . Abdominal pain, generalized   . Abdominal pain, generalized   . Abdominal pain, unspecified site   . Abnormality of gait   . Abnormality of gait   . Abnormality of gait   . Acute bronchitis   . Acute sinusitis, unspecified   . Allergic rhinitis due to pollen   . Allergic rhinitis due to pollen   . Allergy   . Anxiety   . Anxiety state, unspecified   . Candidiasis of vulva and vagina   . Cervicitis and endocervicitis   . Chronic pain of both shoulders   . Closed dislocation of shoulder, unspecified site   . Constipation, chronic   . Contact with or exposure to other communicable diseases(V01.89)    . Cough   . Dementia in conditions classified elsewhere without behavioral disturbance   . Dermatitis   . Diabetes mellitus without complication (HCC)   . Diarrhea   . Diarrhea   . Diverticulosis of colon (without mention of hemorrhage)   . GERD (gastroesophageal reflux disease)   . Hearing loss sensory, bilateral   . Hepatomegaly   . Hepatomegaly   . History of fall   . Hypercalcemia   . Hyperlipidemia   . Hypertension   . Hypopotassemia   . Impacted cerumen   . Kyphosis (acquired) (postural)   . Leukocytosis, unspecified   . Muscle weakness (generalized)   . Osteoarthritis of both knees   . Osteoporosis   . Other and unspecified hyperlipidemia   . Other B-complex deficiencies   . Other malaise and fatigue   . Other malaise and fatigue   . Other psoriasis   . Other specified pre-operative examination   . Pain   . Pain in joint, lower leg   . Pain in joint, shoulder region   . Pain in joint, site unspecified   . Personal history of fall   . Posttraumatic stress disorder   . Posttraumatic stress disorder   . Psoriasis   . Reflux esophagitis   . Senile dementia, uncomplicated (HCC)   . Senile osteoporosis   . Senile osteoporosis   . Shoulder dislocation   . Stiffness  of joint, not elsewhere classified, unspecified site   . Tinnitus   . Tobacco abuse   . Tobacco use disorder   . Tobacco use disorder   . 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 Type II or unspecified type diabetes mellitus without mention of complication, not stated as uncontrolled   . Type II or unspecified type diabetes mellitus without mention of complication, uncontrolled   . Unspecified constipation   . Unspecified essential hypertension   . Unspecified pruritic disorder   . Vascular dementia (HCC)   . Vitamin B12 deficiency     Past Surgical History:  Procedure Laterality Date  . BLADDER SURGERY    . ORIF PATELLA Left  04/08/2013   Procedure: OPEN REDUCTION INTERNAL (ORIF) FIXATION PATELLA;  Surgeon: Eldred MangesMark C Yates, MD;  Location: [facility] OR;  Service: Orthopedics;  Laterality: Left;  Open Reduction Internal Fixation Left Patella Fracture  . SPINE SURGERY    . tubual ligation      Social History   Social History Narrative  . Not on file     reports that she has quit smoking. Her smoking use included cigarettes. She has a 40.00 pack-year smoking history. She quit smokeless tobacco use about 5 years ago. She reports that she does not drink alcohol or use drugs.  Allergies  Allergen Reactions  . Buspar [Buspirone] Hives  . Percocet [Oxycodone-Acetaminophen] Other (See Comments)    Hallucinations   . Vicodin [Hydrocodone-Acetaminophen] Other (See Comments)    Hallucinations     No family history on file. Family history unable to be obtained due to dementia  Prior to Admission medications   Medication Sig Start Date End Date Taking? Authorizing Provider  acetaminophen (TYLENOL) 500 MG tablet Take 1,000 mg by mouth 3 (three) times daily as needed for mild pain.    Yes [provider]  albuterol (PROVENTIL HFA;VENTOLIN HFA) 108 (90 Base) MCG/ACT inhaler Inhale 2 puffs into the lungs every 6 (six) hours as needed for wheezing or shortness of breath.   Yes [provider]  amLODipine (NORVASC) 10 MG tablet Take 10 mg by mouth daily.   Yes [provider]  amoxicillin-clavulanate (AUGMENTIN) 875-125 MG tablet Take 1 tablet by mouth 2 (two) times daily. Start 01-21-19 Ends 01-28-19   Yes [provider]  atenolol (TENORMIN) 50 MG tablet Take 50 mg by mouth daily.   Yes [provider]  atorvastatin (LIPITOR) 10 MG tablet Take 10 mg by mouth daily.   Yes [provider]  dextromethorphan-guaiFENesin (ROBAFEN DM CGH/CHEST CONGEST) 10-100 MG/5ML liquid Take 15 mLs by mouth 3 (three) times daily. For 7 days Starts on 01-20-19 Ends on 01-27-19   Yes [provider]  dicyclomine (BENTYL) 10 MG capsule Take 10 mg by mouth 4 (four) times daily -  before meals and at bedtime.   Yes [provider]  divalproex (DEPAKOTE) 125 MG DR tablet Take 375 mg by mouth 3 (three) times daily.    Yes [provider]  donepezil (ARICEPT) 10 MG tablet Take 10 mg by mouth at bedtime.   Yes [provider]  DULoxetine (CYMBALTA) 30 MG capsule Take 30 mg by mouth daily.   Yes [provider]  esomeprazole (NEXIUM) 40 MG capsule Take 40 mg by mouth daily before breakfast.   Yes [provider]  furosemide (LASIX) 20 MG tablet Take 20 mg by mouth daily.   Yes [provider]  gabapentin (NEURONTIN) 300 MG capsule Take 300 mg by mouth daily.   Yes [provider]  hydrocortisone cream 1 % Apply 1 application topically 2 (two) times daily as needed for itching (to buttocks).   Yes [provider]  lipase/protease/amylase (CREON) 36000 UNITS CPEP capsule Take 36,000 Units by mouth 3 (three) times daily before meals.   Yes [provider]  loperamide (IMODIUM) 2 MG capsule Take 2 mg by mouth 4 (four) times daily as needed for diarrhea or loose stools.   Yes [provider]  LORazepam (ATIVAN) 0.5 MG tablet Take 0.25 mg by mouth 2 (two) times daily.   Yes [provider]  lubiprostone (AMITIZA) 24 MCG capsule Take 24 mcg by mouth 2 (two) times daily with a meal.   Yes [provider]  memantine (NAMENDA) 10 MG tablet Take 10 mg by mouth 2 (two) times daily.   Yes [provider]  potassium chloride (KLOR-CON) 20 MEQ packet Take 20 mEq by mouth daily.   Yes [provider]  ramipril (ALTACE) 10 MG capsule Take 10 mg by mouth daily.   Yes [provider]  Vitamin D, Ergocalciferol, (DRISDOL) 1.25 MG (50000 UT) CAPS capsule Take 50,000 Units by mouth See admin instructions. MON and THUR   Yes [provider]  ipratropium (ATROVENT HFA)  17 MCG/ACT inhaler Inhale 2 puffs into the lungs every 6 (six) hours as needed for wheezing. Patient not taking: Reported on 01/25/2019 02/01/16   Renato Gails, Tiffany L, DO  ipratropium-albuterol (DUONEB) 0.5-2.5 (3) MG/3ML SOLN Take 3 mLs by nebulization every 4 (four) hours as needed (SOB). Ends 01-27-19 01/25/19   Mancel Bale, MD  predniSONE (DELTASONE) 20 MG tablet Take 1 tablet (20 mg total) by mouth 2 (two) times daily. 01/25/19   Mancel Bale, MD    Physical Exam:  Constitutional: NAD, calm, comfortable Vitals:   01/25/19 1530 01/25/19 1609 01/25/19 1630 01/25/19 1739  BP: 112/80  116/68 111/66  Pulse: (!) 113 (!) 106 (!) 102 (!) 147  Resp: (!) 29 19 (!) 24 (!) 26  Temp:      TempSrc:      SpO2: 91%  100% 94%   Eyes: PERRL, lids and conjunctivae normal ENMT: Mucous membranes are moist. Posterior pharynx clear of any exudate or lesions.Normal dentition.  Neck: normal, supple, no masses, no thyromegaly Respiratory: Coarse breath sounds bilaterally, no wheezing, no crackles. Normal respiratory effort. No accessory muscle use.  Cardiovascular: Regular rate and rhythm, no murmurs / rubs / gallops. No extremity edema. 2+ pedal pulses. No carotid bruits.  Abdomen: no tenderness, no masses palpated. No hepatosplenomegaly. Bowel sounds positive.  Musculoskeletal: no clubbing / cyanosis. No joint deformity upper and lower extremities. Good ROM, no contractures. Normal muscle tone.  Skin: no rashes, lesions, ulcers. No induration Neurologic: CN 2-12 grossly intact. Sensation intact, DTR normal. Strength 5/5 in all 4.  Psychiatric: Poor judgment and insight.  Not alert and oriented x 3. Normal mood.  Very pleasantly demented   Labs on Admission: I have personally reviewed following labs and imaging studies  CBC: Recent Labs  Lab 01/25/19 1352 01/25/19 1413  WBC 12.0*  --   NEUTROABS 4.8  --   HGB 12.8 11.6*  HCT 40.5 34.0*  MCV 92.3  --   PLT 195  --    Basic Metabolic Panel: Recent  Labs  Lab 01/25/19 1352 01/25/19 1413  NA 138 146*  K 4.2 2.9*  CL 105  --  CO2 25  --   GLUCOSE 100*  --   BUN 18  --   CREATININE 0.68  --   CALCIUM 9.5  --    Urine analysis:    Component Value Date/Time   COLORURINE YELLOW 06/13/2016 2110   APPEARANCEUR CLOUDY (A) 06/13/2016 2110   APPEARANCEUR Clear 01/25/2016 1616   LABSPEC 1.017 06/13/2016 2110   PHURINE 5.5 06/13/2016 2110   GLUCOSEU NEGATIVE 06/13/2016 2110   HGBUR NEGATIVE 06/13/2016 2110   BILIRUBINUR NEGATIVE 06/13/2016 2110   BILIRUBINUR neg 02/01/2016 1108   BILIRUBINUR Negative 01/25/2016 1616   KETONESUR NEGATIVE 06/13/2016 2110   PROTEINUR NEGATIVE 06/13/2016 2110   UROBILINOGEN negative 02/01/2016 1108   UROBILINOGEN 1.0 06/06/2013 1745   NITRITE NEGATIVE 06/13/2016 2110   LEUKOCYTESUR SMALL (A) 06/13/2016 2110   LEUKOCYTESUR Negative 01/25/2016 1616    Radiological Exams on Admission: Ct Angio Chest Pe W/cm &/or Wo Cm  Result Date: 01/25/2019 CLINICAL DATA:  Shortness of breath starting today. EXAM: CT ANGIOGRAPHY CHEST WITH CONTRAST TECHNIQUE: Multidetector CT imaging of the chest was performed using the standard protocol during bolus administration of intravenous contrast. Multiplanar CT image reconstructions and MIPs were obtained to evaluate the vascular anatomy. CONTRAST:  49mL OMNIPAQUE IOHEXOL 350 MG/ML SOLN COMPARISON:  Chest x-ray January 25, 2019 FINDINGS: Cardiovascular: Satisfactory opacification of the pulmonary arteries to the segmental level. No evidence of pulmonary embolism. Normal heart size. No pericardial effusion. Mediastinum/Nodes: There are small mediastinal and bilateral hilar lymph nodes. There is peribronchial thickening of bilateral lungs of all lobes. The trachea is patent. The esophagus is unremarkable. There is a small hiatal hernia. Lungs/Pleura: There are least 3 nodules in the left lower lobe, largest measures 1.4 cm. There is no pleural effusion or pleural effusion. No definite  focal consolidation is identified. Upper Abdomen: Hypertrophy of bilateral adrenal glands are nonspecific. The other visualized upper abdominal structures are unremarkable. Musculoskeletal: Degenerative joint changes of the spine are noted. Chronic compression deformity of the lower thoracic or upper lumbar vertebral bodies noted. Review of the MIP images confirms the above findings. IMPRESSION: No pulmonary embolus. Mild peribronchial thickening of all lobes. This is nonspecific but can be seen in bronchitis. At least 3 nodules in the left lower lobe, largest measures 1.4 cm. This is nonspecific. Consider further evaluation with PET CT on outpatient basis to exclude neoplasm. Electronically Signed   By: Sherian Rein M.D.   On: 01/25/2019 16:13   Dg Chest Port 1 View  Result Date: 01/25/2019 CLINICAL DATA:  78 year old female with cough for a few days. EXAM: PORTABLE CHEST 1 VIEW COMPARISON:  12/13/2016 chest radiographs and earlier. FINDINGS: Portable AP semi upright view at 1347 hours. Stable lung volumes and mediastinal contours with mildly tortuous aorta. Calcified aortic atherosclerosis. Visualized tracheal air column is within normal limits. There is an indistinct 8 millimeter nodular opacity projecting in the left lung (arrow). Otherwise allowing for portable technique the lungs are clear. No pulmonary edema or pneumothorax. No acute osseous abnormality identified. Negative visible bowel gas pattern. IMPRESSION: 1. Questionable small mid left lung nodule versus artifact. 2. No other acute cardiopulmonary abnormality. Electronically Signed   By: Odessa Fleming M.D.   On: 01/25/2019 13:59    EKG: Independently reviewed.  #1 EKG shows sinus tach and multifocal PVCs with left atrial enlargement. #2  EKG shows SVT at 150 bpm.  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  SVT: We will hydrate patient as she has been febrile appears to be somewhat dehydrated.  Also replete potassium.  Will monitor heart rate and give Lopressor 5 mg IV if heart rate above 120.  2.  Influenza-like illness: I believe patient has influenza.  Will test respiratory viral panel.  He is currently being treated for a pneumonia with amoxicillin clavulanate.  At this time will continue till she completes her course on March 20.  3.  Hypokalemia: Replete orally and IV.  Recheck in a.m.  4.  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  5.  Essential hypertension benign: Continue home medication regimen  6.  Vascular dementia: Supportive care  DVT prophylaxis: Subcu heparin Code Status: Full code Family Communication: Spoke with patient's 2 granddaughters who are present in the room 1 of which is her power of attorney. Disposition Plan: Likely back to skilled facility once improved Consults called: None at this time Admission status: Inpatient   Lahoma Crocker MD FACP [facility] Pager [phone]  How to contact the [facility] Attending or Consulting provider 7A - 7P or covering provider during after hours 7P -7A, for this patient?  1. Check the care team in [facility] and look for a) attending/consulting [facility] provider listed and b) the [facility] team listed 2. Log into [email or web address] and use [facility] universal password to access. If you do not have the password, please contact the hospital operator. 3. Locate the [facility] provider you are looking for under [facility] and page to a number that you can be directly reached. 4. If you still have difficulty reaching the provider, please page the [facility] (Director on Call) for the Hospitalists listed on amion for assistance.  If 7PM-7AM, please  contact night-coverage [email or web address] Password [facility]  01/25/2019, 6:33 PM

## 2019-01-26 ENCOUNTER — Encounter (HOSPITAL_COMMUNITY): Payer: Self-pay | Admitting: *Deleted

## 2019-01-26 ENCOUNTER — Inpatient Hospital Stay (HOSPITAL_COMMUNITY): Payer: Medicare Other

## 2019-01-26 DIAGNOSIS — R0602 Shortness of breath: Secondary | ICD-10-CM

## 2019-01-26 DIAGNOSIS — I10 Essential (primary) hypertension: Secondary | ICD-10-CM

## 2019-01-26 DIAGNOSIS — E1142 Type 2 diabetes mellitus with diabetic polyneuropathy: Secondary | ICD-10-CM

## 2019-01-26 LAB — CBC
HCT: 36.5 % (ref 36.0–46.0)
Hemoglobin: 11.8 g/dL — ABNORMAL LOW (ref 12.0–15.0)
MCH: 29.4 pg (ref 26.0–34.0)
MCHC: 32.3 g/dL (ref 30.0–36.0)
MCV: 91 fL (ref 80.0–100.0)
NRBC: 0.2 % (ref 0.0–0.2)
Platelets: 196 10*3/uL (ref 150–400)
RBC: 4.01 MIL/uL (ref 3.87–5.11)
RDW: 15 % (ref 11.5–15.5)
WBC: 11.4 10*3/uL — ABNORMAL HIGH (ref 4.0–10.5)

## 2019-01-26 LAB — T4, FREE: Free T4: 0.85 ng/dL (ref 0.82–1.77)

## 2019-01-26 LAB — BASIC METABOLIC PANEL
Anion gap: 11 (ref 5–15)
BUN: 20 mg/dL (ref 8–23)
CO2: 23 mmol/L (ref 22–32)
Calcium: 10 mg/dL (ref 8.9–10.3)
Chloride: 110 mmol/L (ref 98–111)
Creatinine, Ser: 0.64 mg/dL (ref 0.44–1.00)
GFR calc Af Amer: >60 mL/min (ref >60)
GFR calc non Af Amer: >60 mL/min (ref >60)
Glucose, Bld: 115 mg/dL — ABNORMAL HIGH (ref 70–99)
Potassium: 5 mmol/L (ref 3.5–5.1)
Sodium: 144 mmol/L (ref 135–145)

## 2019-01-26 LAB — ECHOCARDIOGRAM COMPLETE: Weight: 2504.43 oz

## 2019-01-26 LAB — GLUCOSE, CAPILLARY
GLUCOSE-CAPILLARY: 77 mg/dL (ref 70–99)
Glucose-Capillary: 107 mg/dL — ABNORMAL HIGH (ref 70–99)
Glucose-Capillary: 78 mg/dL (ref 70–99)
Glucose-Capillary: 91 mg/dL (ref 70–99)

## 2019-01-26 LAB — TROPONIN I
Troponin I: <0.03 ng/mL (ref <0.03)
Troponin I: <0.03 ng/mL (ref <0.03)

## 2019-01-26 LAB — MAGNESIUM: Magnesium: 2 mg/dL (ref 1.7–2.4)

## 2019-01-26 LAB — MRSA PCR SCREENING: MRSA by PCR: POSITIVE — AB

## 2019-01-26 LAB — TSH: TSH: 0.14 u[IU]/mL — ABNORMAL LOW (ref 0.350–4.500)

## 2019-01-26 MED ORDER — MUPIROCIN 2 % EX OINT
1.0000 "application " | TOPICAL_OINTMENT | Freq: Two times a day (BID) | CUTANEOUS | Status: DC
  Administered 2019-01-26 – 2019-01-27 (×3): 1 via NASAL
  Filled 2019-01-26: qty 22

## 2019-01-26 MED ORDER — CHLORHEXIDINE GLUCONATE CLOTH 2 % EX PADS
6.0000 | MEDICATED_PAD | Freq: Every day | CUTANEOUS | Status: DC
  Administered 2019-01-26 – 2019-01-27 (×2): 6 via TOPICAL

## 2019-01-26 NOTE — TOC Initial Note (Signed)
Transition of Care [facility]) - Initial/Assessment Note    Patient Details  Name: Amanda Adkins MRN: [id number] Date of Birth: 11/01/1941  Transition of Care [facility]) CM/SW Contact:    Abigail Butts, LCSW Phone Number: 01/26/2019, 12:30 PM  Clinical Narrative: Patient is a long term care resident at [facility] SNF. CSW spoke to patient's daughter, [name], on the phone; patient is not oriented. Discussed disposition plan. Daughter agreeable for patient to return to the SNF. She went and completed paperwork at the facility. Patient does not need insurance authorization to return. CSW to follow for medical readiness and support with discharge planning.                   Expected Discharge Plan: Skilled Nursing Facility Barriers to Discharge: Continued Medical Work up   Patient Goals and CMS Choice   CMS Medicare.gov Compare Post Acute Care list provided to:: Patient Represenative (must comment)(daughter) Choice offered to / list presented to : Adult Children  Expected Discharge Plan and Services Expected Discharge Plan: Skilled Nursing Facility   Post Acute Care Choice: Skilled Nursing Facility Living arrangements for the past 2 months: Skilled Nursing Facility                          Prior Living Arrangements/Services Living arrangements for the past 2 months: Skilled Nursing Facility Lives with:: Facility Resident Patient language and need for interpreter reviewed:: Yes Do you feel safe going back to the place where you live?: Yes      Need for Family Participation in Patient Care: Yes (Comment) Care giver support system in place?: Yes (comment)   Criminal Activity/Legal Involvement Pertinent to Current Situation/Hospitalization: No - Comment as needed  Activities of Daily Living Home Assistive Devices/Equipment: Wheelchair ADL Screening (condition at time of admission) Patient's cognitive ability adequate to safely complete daily activities?: No Is the patient deaf or have  difficulty hearing?: No Does the patient have difficulty seeing, even when wearing glasses/contacts?: No Does the patient have difficulty concentrating, remembering, or making decisions?: Yes Patient able to express need for assistance with ADLs?: Yes Does the patient have difficulty dressing or bathing?: Yes Independently performs ADLs?: No Communication: Independent Dressing (OT): Needs assistance Is this a change from baseline?: Pre-admission baseline Grooming: Needs assistance Is this a change from baseline?: Pre-admission baseline Feeding: Needs assistance Is this a change from baseline?: Pre-admission baseline Bathing: Needs assistance Is this a change from baseline?: Pre-admission baseline Toileting: Needs assistance Is this a change from baseline?: Pre-admission baseline In/Out Bed: Needs assistance Is this a change from baseline?: Pre-admission baseline Walks in Home: Dependent(wheelchair bound) Is this a change from baseline?: Pre-admission baseline Does the patient have difficulty walking or climbing stairs?: No(no stairs) Weakness of Legs: Both Weakness of Arms/Hands: None  Permission Sought/Granted Permission sought to share information with : Facility Medical sales representative, Family Supports Permission granted to share information with : No(patient not oriented)  Share Information with NAME: Lum Babe  Permission granted to share info w AGENCY: [facility]  Permission granted to share info w Relationship: daughter  Permission granted to share info w Contact Information: [phone]  Emotional Assessment Appearance:: Appears stated age Attitude/Demeanor/Rapport: Unable to Assess Affect (typically observed): Unable to Assess Orientation: : Oriented to Self Alcohol / Substance Use: Not Applicable Psych Involvement: No (comment)  Admission diagnosis:  COPD exacerbation (HCC) [J44.1] Patient Active Problem List   Diagnosis Date Noted  . Influenza-like illness  01/25/2019  . SVT (  supraventricular tachycardia) (HCC) 01/25/2019  . Hypokalemia 01/25/2019  . Candidal skin infection 02/02/2014  . 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 Pancreatic insufficiency 02/02/2014  . GERD (gastroesophageal reflux disease) 06/16/2013  . Weakness generalized 06/09/2013  . 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 Left patella fracture 04/07/2013  . Essential hypertension, benign   . Stiffness of joint, not elsewhere classified, unspecified site   . Pain in joint, site unspecified   . Chronic pain of both shoulders   . Tobacco abuse   . Osteoarthritis of both knees   . Shoulder dislocation   . Vascular dementia (HCC)   . DYSPNEA 09/24/2009   PCP:  No primary care provider on file. Pharmacy:  No Pharmacies Listed    Social Determinants of Health (SDOH) Interventions    Readmission Risk Interventions 30 Day Unplanned Readmission Risk Score     ED to Hosp-Admission (Current) from 01/25/2019 in [place] [facility] Progressive [facility]  30 Day Unplanned Readmission Risk Score (%)  13 Filed at 01/26/2019 1200     This score is the patient's risk of an unplanned readmission within 30 days of being discharged (0 -100%). The score is based on dignosis, age, lab data, medications, orders, and past utilization.   Low:  0-14.9   Medium: 15-21.9   High: 22-29.9   Extreme: 30 and above       No flowsheet data found.

## 2019-01-26 NOTE — Plan of Care (Signed)
  Problem: Clinical Measurements: Goal: Respiratory complications will improve Outcome: Progressing Note:  No s/s of respiratory complications noted.  Stable on room air. Goal: Cardiovascular complication will be avoided Outcome: Progressing Note:  No s/s of cardiovascular complications noted.

## 2019-01-26 NOTE — Progress Notes (Signed)
[facility] PROGRESS NOTE  Amanda Adkins ZOX:[id number] DOB: 12-05-1940 DOA: 01/25/2019  PCP: No primary care provider on file.  Brief History/Interval Summary: 78 y.o. female with medical history significant of vascular dementia, abnormal gait, chronic pain of shoulders, diabetes type 2, constipation chronic, osteoporosis, diagnosed with pneumonia last week at nursing facility who presented with complaints of cough and shortness of breath.  She has been taking antibiotics for pneumonia and remains hypoxic at the facility. When EMS arrived she had a normal O2 sat and was on room air. CTA of the chest did not show any PE, there was some bronchitis, 3 lung nodules.  Patient to be admitted to the hospital for SVT and evaluation of flulike illness.  Reason for Visit: SVT  Consultants: None  Procedures: None  Antibiotics: Currently on a course of Augmentin  Subjective/Interval History: Patient appears to be pleasantly confused.  Denies any complaint.  Does not appear to be in any pain or discomfort.  She denies any shortness of breath.  ROS: Unable to do due to her dementia  Objective:  Vital Signs  Vitals:   01/26/19 0522 01/26/19 0652 01/26/19 0905 01/26/19 0907  BP:  118/71 (!) 136/99 (!) 136/99  Pulse:  96 88   Resp:  (!) 21    Temp:  98.4 F (36.9 C)    TempSrc: Oral Oral    SpO2:  93%    Weight:  71 kg      Intake/Output Summary (Last 24 hours) at 01/26/2019 1046 Last data filed at 01/26/2019 0911 Gross per 24 hour  Intake 972.19 ml  Output 350 ml  Net 622.19 ml   Filed Weights   01/26/19 0652  Weight: 71 kg    General appearance: Awake alert.  In no distress.  Pleasantly confused Resp: Clear to auscultation bilaterally.  Normal effort Cardio: S1-S2 is normal regular.  No S3-S4.  No rubs murmurs or bruit GI: Abdomen is soft.  Nontender nondistended.  Bowel sounds are present normal.  No masses organomegaly Extremities: No edema.  Full range of motion of  lower extremities. Neurologic: No focal neurological deficits.    Lab Results:  Data Reviewed: I have personally reviewed following labs and imaging studies  CBC: Recent Labs  Lab 01/25/19 1352 01/25/19 1413 01/26/19 0631  WBC 12.0*  --  11.4*  NEUTROABS 4.8  --   --   HGB 12.8 11.6* 11.8*  HCT 40.5 34.0* 36.5  MCV 92.3  --  91.0  PLT 195  --  196    Basic Metabolic Panel: Recent Labs  Lab 01/25/19 1352 01/25/19 1413 01/26/19 0631  NA 138 146* 144  K 4.2 2.9* 5.0  CL 105  --  110  CO2 25  --  23  GLUCOSE 100*  --  115*  BUN 18  --  20  CREATININE 0.68  --  0.64  CALCIUM 9.5  --  10.0  MG  --   --  2.0     Cardiac Enzymes: Recent Labs  Lab 01/25/19 2054 01/26/19 0252 01/26/19 0631  TROPONINI <0.03 <0.03 <0.03     CBG: Recent Labs  Lab 01/25/19 2136 01/26/19 0804  GLUCAP 209* 107*     Recent Results (from the past 240 hour(s))  Respiratory Panel by PCR     Status: None   Collection Time: 01/25/19  6:45 PM  Result Value Ref Range Status   Adenovirus NOT DETECTED NOT DETECTED Final   Coronavirus 229E NOT DETECTED NOT  DETECTED Final    Comment: (NOTE) The Coronavirus on the Respiratory Panel, DOES NOT test for the novel  Coronavirus (2019 nCoV)    Coronavirus HKU1 NOT DETECTED NOT DETECTED Final   Coronavirus NL63 NOT DETECTED NOT DETECTED Final   Coronavirus OC43 NOT DETECTED NOT DETECTED Final   Metapneumovirus NOT DETECTED NOT DETECTED Final   Rhinovirus / Enterovirus NOT DETECTED NOT DETECTED Final   Influenza A NOT DETECTED NOT DETECTED Final   Influenza B NOT DETECTED NOT DETECTED Final   Parainfluenza Virus 1 NOT DETECTED NOT DETECTED Final   Parainfluenza Virus 2 NOT DETECTED NOT DETECTED Final   Parainfluenza Virus 3 NOT DETECTED NOT DETECTED Final   Parainfluenza Virus 4 NOT DETECTED NOT DETECTED Final   Respiratory Syncytial Virus NOT DETECTED NOT DETECTED Final   Bordetella pertussis NOT DETECTED NOT DETECTED Final    Chlamydophila pneumoniae NOT DETECTED NOT DETECTED Final   Mycoplasma pneumoniae NOT DETECTED NOT DETECTED Final    Comment: Performed at [facility], [address], [place], [place] [address]  MRSA PCR Screening     Status: Abnormal   Collection Time: 01/25/19 10:58 PM  Result Value Ref Range Status   MRSA by PCR POSITIVE (A) NEGATIVE Final    Comment:        The GeneXpert MRSA Assay (FDA approved for NASAL specimens only), is one component of a comprehensive MRSA colonization surveillance program. It is not intended to diagnose MRSA infection nor to guide or monitor treatment for MRSA infections. RESULT CALLED TO, READ BACK BY AND VERIFIED WITH: D. DUVALL,RN 0120 01/26/2019 Girtha Hake Performed at [facility], [address], [place], [place] [address]       Radiology Studies: Ct Angio Chest Pe W/cm &/or Wo Cm  Result Date: 01/25/2019 CLINICAL DATA:  Shortness of breath starting today. EXAM: CT ANGIOGRAPHY CHEST WITH CONTRAST TECHNIQUE: Multidetector CT imaging of the chest was performed using the standard protocol during bolus administration of intravenous contrast. Multiplanar CT image reconstructions and MIPs were obtained to evaluate the vascular anatomy. CONTRAST:  28mL OMNIPAQUE IOHEXOL 350 MG/ML SOLN COMPARISON:  Chest x-ray January 25, 2019 FINDINGS: Cardiovascular: Satisfactory opacification of the pulmonary arteries to the segmental level. No evidence of pulmonary embolism. Normal heart size. No pericardial effusion. Mediastinum/Nodes: There are small mediastinal and bilateral hilar lymph nodes. There is peribronchial thickening of bilateral lungs of all lobes. The trachea is patent. The esophagus is unremarkable. There is a small hiatal hernia. Lungs/Pleura: There are least 3 nodules in the left lower lobe, largest measures 1.4 cm. There is no pleural effusion or pleural effusion. No definite focal consolidation is identified. Upper Abdomen: Hypertrophy of bilateral  adrenal glands are nonspecific. The other visualized upper abdominal structures are unremarkable. Musculoskeletal: Degenerative joint changes of the spine are noted. Chronic compression deformity of the lower thoracic or upper lumbar vertebral bodies noted. Review of the MIP images confirms the above findings. IMPRESSION: No pulmonary embolus. Mild peribronchial thickening of all lobes. This is nonspecific but can be seen in bronchitis. At least 3 nodules in the left lower lobe, largest measures 1.4 cm. This is nonspecific. Consider further evaluation with PET CT on outpatient basis to exclude neoplasm. Electronically Signed   By: Sherian Rein M.D.   On: 01/25/2019 16:13   Dg Chest Port 1 View  Result Date: 01/25/2019 CLINICAL DATA:  78 year old female with cough for a few days. EXAM: PORTABLE CHEST 1 VIEW COMPARISON:  12/13/2016 chest radiographs and  earlier. FINDINGS: Portable AP semi upright view at 1347 hours. Stable lung volumes and mediastinal contours with mildly tortuous aorta. Calcified aortic atherosclerosis. Visualized tracheal air column is within normal limits. There is an indistinct 8 millimeter nodular opacity projecting in the left lung (arrow). Otherwise allowing for portable technique the lungs are clear. No pulmonary edema or pneumothorax. No acute osseous abnormality identified. Negative visible bowel gas pattern. IMPRESSION: 1. Questionable small mid left lung nodule versus artifact. 2. No other acute cardiopulmonary abnormality. Electronically Signed   By: Odessa Fleming M.D.   On: 01/25/2019 13:59     Medications:  Scheduled:  amLODipine  10 mg Oral Daily   amoxicillin-clavulanate  1 tablet Oral BID   atenolol  50 mg Oral Daily   atorvastatin  10 mg Oral Daily   Chlorhexidine Gluconate Cloth  6 each Topical Q0600   dicyclomine  10 mg Oral TID AC & HS   divalproex  375 mg Oral TID   donepezil  10 mg Oral QHS   DULoxetine  30 mg Oral Daily   furosemide  20 mg Oral Daily     gabapentin  300 mg Oral Daily   heparin  5,000 Units Subcutaneous Q8H   insulin aspart  0-15 Units Subcutaneous TID WC   insulin aspart  0-5 Units Subcutaneous QHS   ipratropium  0.5 mg Nebulization TID   levalbuterol  0.63 mg Nebulization TID   lipase/protease/amylase  36,000 Units Oral TID AC   LORazepam  0.25 mg Oral BID   lubiprostone  24 mcg Oral BID WC   memantine  10 mg Oral BID   mupirocin ointment  1 application Nasal BID   pantoprazole  80 mg Oral Q1200   potassium chloride  20 mEq Oral BID   ramipril  10 mg Oral Daily   sodium chloride flush  3 mL Intravenous Q12H   Continuous:  0.9 % NaCl with KCl 20 mEq / L 75 mL/hr at 01/25/19 2310   ONG:[id number], loperamide, metoprolol tartrate    Assessment/Plan:  SVT Thought to be secondary to dehydration and hypokalemia.  Patient was hydrated.  Heart rate has improved.  Telemetry reviewed and does show periodic bursts of heart rate into the 120s which is so not sustained.  Is noted to be on atenolol which will be continued.  Will order TSH if 1 has not been done recently.  No echocardiogram noted in the EMR.  Recent pneumonia Patient's respiratory status appears to be stable.  Respiratory viral panel was unremarkable.  She is afebrile.  Continue with Augmentin for now.  Hypokalemia Repleted.  Potassium noted to be 5.0 this morning.  Will stop daily supplementation.  Diabetes mellitus type 2, controlled, neurological manifestations Continue to monitor CBGs.  Essential hypertension Monitor blood pressures.  History of vascular dementia Stable.  Continue home medications.  DVT Prophylaxis: Subcutaneous heparin    Code Status: Full code Family Communication: Discussed with the patient.  No family at bedside Disposition Plan: Management as outlined above.    LOS: 1 day   Mark Benecke [employer] on [email or web address]  01/26/2019, 10:46 AM

## 2019-01-26 NOTE — NC FL2 (Signed)
[place] MEDICAID FL2 LEVEL OF CARE SCREENING TOOL     IDENTIFICATION  Patient Name: Amanda Adkins Birthdate: 1941-10-12 Sex: female Admission Date (Current Location): 01/25/2019  [facility] and [place] Number:  Producer, television/film/video and Address:  [facility], [address], [place], [place] [address]      Provider Number: [id number]  Attending Physician Name and Address:  Osvaldo Shipper, MD  Relative Name and Phone Number:  Lum Babe, daughter, [phone]    Current Level of Care: Hospital Recommended Level of Care: Skilled Nursing Facility Prior Approval Number:    Date Approved/Denied:   PASRR Number: [id number]  Discharge Plan: SNF    Current Diagnoses: Patient Active Problem List   Diagnosis Date Noted  . Influenza-like illness 01/25/2019  . SVT (supraventricular tachycardia) (HCC) 01/25/2019  . Hypokalemia 01/25/2019  . Candidal skin infection 02/02/2014  . 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 Pancreatic insufficiency 02/02/2014  . GERD (gastroesophageal reflux disease) 06/16/2013  . Weakness generalized 06/09/2013  . 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 Left patella fracture 04/07/2013  . Essential hypertension, benign   . Stiffness of joint, not elsewhere classified, unspecified site   . Pain in joint, site unspecified   . Chronic pain of both shoulders   . Tobacco abuse   . Osteoarthritis of both knees   . Shoulder dislocation   . Vascular dementia (HCC)   . DYSPNEA 09/24/2009    Orientation RESPIRATION BLADDER Height & Weight     Self  Normal Incontinent, External catheter Weight: 156 lb 8.4 oz (71 kg) Height:     BEHAVIORAL SYMPTOMS/MOOD NEUROLOGICAL BOWEL NUTRITION STATUS      Continent Diet(carbmodified/please see DC summary)  AMBULATORY STATUS COMMUNICATION OF NEEDS Skin    (baseline) Verbally Normal                       Personal Care Assistance Level of Assistance  Bathing, Feeding, Dressing Bathing Assistance: (baseline) Feeding assistance: (baseline) Dressing Assistance: (baseline)     Functional Limitations Info             SPECIAL CARE FACTORS FREQUENCY                       Contractures Contractures Info: Not present    Additional Factors Info  Code Status, Allergies, Psychotropic, Insulin Sliding Scale Code Status Info: Full Allergies Info: Buspar (Buspirone), Percocet (Oxycodone-acetaminophen), Vicodin (Hydrocodone-acetaminophen) Psychotropic Info: depakote, aricept, cymbalta, ativan, namenda Insulin Sliding Scale Info: novolog 3x/day with meals and at bedtime       Current Medications (01/26/2019):  This is the current hospital active medication list Current Facility-Administered Medications  Medication Dose Route Frequency Provider Last Rate Last Dose  . 0.9 % NaCl with KCl 20 mEq/ L  infusion   Intravenous Continuous Lahoma Crocker, MD 75 mL/hr at 01/25/19 2310    . amLODipine (NORVASC) tablet 10 mg  10 mg Oral Daily Lahoma Crocker, MD   10 mg at 01/26/19 0906  . amoxicillin-clavulanate (AUGMENTIN) 875-125 MG per tablet 1 tablet  1 tablet Oral BID Lahoma Crocker, MD   1 tablet at 01/26/19 0906  . atenolol (TENORMIN) tablet 50 mg  50 mg Oral Daily Lahoma Crocker, MD   50 mg at 01/26/19 0907  . atorvastatin (LIPITOR) tablet 10 mg  10 mg Oral  Daily Lahoma Crocker, MD   10 mg at 01/26/19 0722  . Chlorhexidine Gluconate Cloth 2 % PADS 6 each  6 each Topical Q0600 Lahoma Crocker, MD   6 each at 01/26/19 0550  . dicyclomine (BENTYL) capsule 10 mg  10 mg Oral TID AC & HS Lahoma Crocker, MD   10 mg at 01/26/19 5750  . divalproex (DEPAKOTE) DR tablet 375 mg  375 mg Oral TID Lahoma Crocker, MD   375 mg at 01/26/19 0907  . donepezil (ARICEPT) tablet 10 mg  10 mg Oral QHS Lahoma Crocker, MD   10  mg at 01/25/19 2233  . DULoxetine (CYMBALTA) DR capsule 30 mg  30 mg Oral Daily Lahoma Crocker, MD   30 mg at 01/26/19 5183  . furosemide (LASIX) tablet 20 mg  20 mg Oral Daily Lahoma Crocker, MD   20 mg at 01/26/19 0906  . gabapentin (NEURONTIN) capsule 300 mg  300 mg Oral Daily Lahoma Crocker, MD   300 mg at 01/26/19 0906  . heparin injection 5,000 Units  5,000 Units Subcutaneous Q8H Lahoma Crocker, MD   5,000 Units at 01/26/19 0548  . insulin aspart (novoLOG) injection 0-15 Units  0-15 Units Subcutaneous TID WC Lahoma Crocker, MD      . insulin aspart (novoLOG) injection 0-5 Units  0-5 Units Subcutaneous QHS Lahoma Crocker, MD   2 Units at 01/25/19 2233  . ipratropium (ATROVENT) nebulizer solution 0.5 mg  0.5 mg Nebulization TID Lahoma Crocker, MD      . levalbuterol Pauline Aus) nebulizer solution 0.63 mg  0.63 mg Nebulization Q6H PRN Lahoma Crocker, MD      . levalbuterol Pauline Aus) nebulizer solution 0.63 mg  0.63 mg Nebulization TID Lahoma Crocker, MD      . lipase/protease/amylase (CREON) capsule 36,000 Units  36,000 Units Oral TID [place] Levo Lahoma Crocker, MD   36,000 Units at 01/26/19 [phone]  . loperamide (IMODIUM) capsule 2 mg  2 mg Oral QID PRN Lahoma Crocker, MD      . LORazepam (ATIVAN) tablet 0.25 mg  0.25 mg Oral BID Lahoma Crocker, MD   0.25 mg at 01/26/19 0906  . lubiprostone (AMITIZA) capsule 24 mcg  24 mcg Oral BID WC Lahoma Crocker, MD   24 mcg at 01/26/19 0905  . memantine (NAMENDA) tablet 10 mg  10 mg Oral BID Lahoma Crocker, MD   10 mg at 01/26/19 0906  . metoprolol tartrate (LOPRESSOR) injection 5 mg  5 mg Intravenous Q4H PRN Lahoma Crocker, MD      . mupirocin ointment (BACTROBAN) 2 % 1 application  1 application Nasal BID Lahoma Crocker, MD   1 application at 01/26/19 [phone]  . pantoprazole (PROTONIX) EC tablet 80 mg  80 mg Oral Q1200 Lahoma Crocker, MD      . ramipril (ALTACE) capsule 10 mg  10 mg Oral Daily Lahoma Crocker, MD   10 mg at 01/26/19 0908  . sodium chloride flush (NS) 0.9 % injection 3 mL  3 mL Intravenous Q12H Lahoma Crocker, MD   3 mL at 01/26/19 0911     Discharge Medications: Please see discharge summary for a list of discharge medications.  Relevant Imaging Results:  Relevant Lab Results:   Additional Information SSN: [id number]  Abigail Butts, LCSW

## 2019-01-26 NOTE — Progress Notes (Signed)
  Echocardiogram 2D Echocardiogram has been performed.  Amanda Adkins 01/26/2019, 1:37 PM

## 2019-01-27 LAB — BASIC METABOLIC PANEL
ANION GAP: 9 (ref 5–15)
BUN: 24 mg/dL — ABNORMAL HIGH (ref 8–23)
CO2: 23 mmol/L (ref 22–32)
Calcium: 9.2 mg/dL (ref 8.9–10.3)
Chloride: 106 mmol/L (ref 98–111)
Creatinine, Ser: 0.61 mg/dL (ref 0.44–1.00)
GFR calc Af Amer: >60 mL/min (ref >60)
GFR calc non Af Amer: >60 mL/min (ref >60)
Glucose, Bld: 87 mg/dL (ref 70–99)
Potassium: 4.4 mmol/L (ref 3.5–5.1)
Sodium: 138 mmol/L (ref 135–145)

## 2019-01-27 LAB — GLUCOSE, CAPILLARY
Glucose-Capillary: 65 mg/dL — ABNORMAL LOW (ref 70–99)
Glucose-Capillary: 75 mg/dL (ref 70–99)

## 2019-01-27 MED ORDER — LORAZEPAM 0.5 MG PO TABS: 0.2500 mg | ORAL_TABLET | Freq: Two times a day (BID) | ORAL | 0 refills | Status: AC

## 2019-01-27 NOTE — Discharge Summary (Signed)
[facility]  Physician Discharge Summary   Patient ID: Amanda Adkins MRN: [id number] DOB/AGE: 1941/04/22 78 y.o.  Admit date: 01/25/2019 Discharge date: 01/27/2019  PCP: No primary care provider on file.  DISCHARGE DIAGNOSES:  Paroxysmal SVT, resolved Hypokalemia, repleted Diabetes mellitus type 2 with episodes of hypoglycemia Essential hypertension History of vascular dementia  RECOMMENDATIONS FOR OUTPATIENT FOLLOW UP: 1. Monitor CBGs 3 times a day.  Encourage oral intake. 2. Please check TSH and free T4 in 2 to 3 weeks. 3. Please see below regarding the pulmonary nodules detected on CT angiogram.  This will need outpatient evaluation.   Home Health: None Equipment/Devices: None  CODE STATUS: Full code  DISCHARGE CONDITION: fair  Diet recommendation: Low-sodium  INITIAL HISTORY: 78 y.o.femalewith medical history significant ofvascular dementia,abnormal gait, chronic pain of shoulders, diabetes type 2, constipation chronic, osteoporosis, diagnosed with pneumonia last week at nursing facility who presented with complaints of cough and shortness of breath. She has been taking antibiotics for pneumonia and remains hypoxic at the facility. When EMS arrived she had a normal O2 sat and was on room air. CTA of the chest did not show any PE, there was some bronchitis, 3 lung nodules. Patient to be admitted to the hospital for SVT and evaluation of flulike illness.  Consultations:  None  Procedures:  Transthoracic echocardiogram IMPRESSIONS   1. The left ventricle has normal systolic function, with an ejection fraction of 55-60%. The cavity size was normal. There is mild asymmetric left ventricular hypertrophy. Left ventricular diastolic Doppler parameters are consistent with  pseudonormalization.  2. The right ventricle has normal systolic function. The cavity was normal. There is no increase in right ventricular wall thickness.  3. No stenosis of the aortic  valve.  4. Pulmonary hypertension is mild.  5. The inferior vena cava was dilated in size with >50% respiratory variability.  6. The interatrial septum was not assessed.   HOSPITAL COURSE:   SVT Thought to be secondary to dehydration and hypokalemia.  Patient was hydrated.  Heart rate has improved.  Over the last 24 hours telemetry has not showed any further episodes of SVT.  Atenolol was continued.   TSH was noted to be 0.14.  Free T4 is normal at 0.85.  Would recommend rechecking her thyroid function tests in 2 to 3 weeks.  Echocardiogram was also done without any significant abnormalities.  See report above.  Recent pneumonia Pneumonia was recently diagnosed at the skilled nursing facility.  She had been on Augmentin which was continued in the hospital.  Respiratory status is stable.  Respiratory viral panel was unremarkable.  She is afebrile.    Hypokalemia This was repleted.  Potassium is normal today.    Diabetes mellitus type 2, controlled, neurological manifestations Patient noted to have borderline low glucose levels with drop to 65 this morning for which she was asymptomatic.  She is not noted to be on any glucose lowering agents.  Will liberalize her diet.  Essential hypertension Well controlled in the hospital.  History of vascular dementia Stable.  Continue home medications.  Pulmonary nodules According to the CT scan report there were at least 3 nodules in the left lower lobe, largest measures 1.4 cm. This is nonspecific. Consider further evaluation with PET CT on outpatient basis to exclude neoplasm.  Overall stable.  Okay for discharge back to skilled nursing facility today.   PERTINENT LABS:  The results of significant diagnostics from this hospitalization (including imaging, microbiology, ancillary and laboratory) are listed below  for reference.    Microbiology: Recent Results (from the past 240 hour(s))  Respiratory Panel by PCR     Status: None    Collection Time: 01/25/19  6:45 PM  Result Value Ref Range Status   Adenovirus NOT DETECTED NOT DETECTED Final   Coronavirus 229E NOT DETECTED NOT DETECTED Final    Comment: (NOTE) The Coronavirus on the Respiratory Panel, DOES NOT test for the novel  Coronavirus (2019 nCoV)    Coronavirus HKU1 NOT DETECTED NOT DETECTED Final   Coronavirus NL63 NOT DETECTED NOT DETECTED Final   Coronavirus OC43 NOT DETECTED NOT DETECTED Final   Metapneumovirus NOT DETECTED NOT DETECTED Final   Rhinovirus / Enterovirus NOT DETECTED NOT DETECTED Final   Influenza A NOT DETECTED NOT DETECTED Final   Influenza B NOT DETECTED NOT DETECTED Final   Parainfluenza Virus 1 NOT DETECTED NOT DETECTED Final   Parainfluenza Virus 2 NOT DETECTED NOT DETECTED Final   Parainfluenza Virus 3 NOT DETECTED NOT DETECTED Final   Parainfluenza Virus 4 NOT DETECTED NOT DETECTED Final   Respiratory Syncytial Virus NOT DETECTED NOT DETECTED Final   Bordetella pertussis NOT DETECTED NOT DETECTED Final   Chlamydophila pneumoniae NOT DETECTED NOT DETECTED Final   Mycoplasma pneumoniae NOT DETECTED NOT DETECTED Final    Comment: Performed at [facility], [address], [place], [place] [address]  MRSA PCR Screening     Status: Abnormal   Collection Time: 01/25/19 10:58 PM  Result Value Ref Range Status   MRSA by PCR POSITIVE (A) NEGATIVE Final    Comment:        The GeneXpert MRSA Assay (FDA approved for NASAL specimens only), is one component of a comprehensive MRSA colonization surveillance program. It is not intended to diagnose MRSA infection nor to guide or monitor treatment for MRSA infections. RESULT CALLED TO, READ BACK BY AND VERIFIED WITH: D. DUVALL,RN 0120 01/26/2019 Girtha Hake Performed at [facility], [address], [place], [place] [address]      Labs: Basic Metabolic Panel: Recent Labs  Lab 01/25/19 1352 01/25/19 1413 01/26/19 0631 01/27/19 0406  NA 138 146* 144 138  K 4.2 2.9* 5.0  4.4  CL 105  --  110 106  CO2 25  --  23 23  GLUCOSE 100*  --  115* 87  BUN 18  --  20 24*  CREATININE 0.68  --  0.64 0.61  CALCIUM 9.5  --  10.0 9.2  MG  --   --  2.0  --    CBC: Recent Labs  Lab 01/25/19 1352 01/25/19 1413 01/26/19 0631  WBC 12.0*  --  11.4*  NEUTROABS 4.8  --   --   HGB 12.8 11.6* 11.8*  HCT 40.5 34.0* 36.5  MCV 92.3  --  91.0  PLT 195  --  196   Cardiac Enzymes: Recent Labs  Lab 01/25/19 2054 01/26/19 0252 01/26/19 0631  TROPONINI <0.03 <0.03 <0.03    CBG: Recent Labs  Lab 01/26/19 1402 01/26/19 1610 01/26/19 2239 01/27/19 0811 01/27/19 0831  GLUCAP 78 91 77 65* 75     IMAGING STUDIES Ct Angio Chest Pe W/cm &/or Wo Cm  Result Date: 01/25/2019 CLINICAL DATA:  Shortness of breath starting today. EXAM: CT ANGIOGRAPHY CHEST WITH CONTRAST TECHNIQUE: Multidetector CT imaging of the chest was performed using the standard protocol during bolus administration of intravenous contrast. Multiplanar CT image reconstructions and MIPs were obtained to evaluate the vascular anatomy. CONTRAST:  49mL OMNIPAQUE IOHEXOL 350 MG/ML SOLN COMPARISON:  Chest x-ray January 25, 2019 FINDINGS: Cardiovascular: Satisfactory opacification of the pulmonary arteries to the segmental level. No evidence of pulmonary embolism. Normal heart size. No pericardial effusion. Mediastinum/Nodes: There are small mediastinal and bilateral hilar lymph nodes. There is peribronchial thickening of bilateral lungs of all lobes. The trachea is patent. The esophagus is unremarkable. There is a small hiatal hernia. Lungs/Pleura: There are least 3 nodules in the left lower lobe, largest measures 1.4 cm. There is no pleural effusion or pleural effusion. No definite focal consolidation is identified. Upper Abdomen: Hypertrophy of bilateral adrenal glands are nonspecific. The other visualized upper abdominal structures are unremarkable. Musculoskeletal: Degenerative joint changes of the spine are noted.  Chronic compression deformity of the lower thoracic or upper lumbar vertebral bodies noted. Review of the MIP images confirms the above findings. IMPRESSION: No pulmonary embolus. Mild peribronchial thickening of all lobes. This is nonspecific but can be seen in bronchitis. At least 3 nodules in the left lower lobe, largest measures 1.4 cm. This is nonspecific. Consider further evaluation with PET CT on outpatient basis to exclude neoplasm. Electronically Signed   By: Sherian Rein M.D.   On: 01/25/2019 16:13   Dg Chest Port 1 View  Result Date: 01/25/2019 CLINICAL DATA:  78 year old female with cough for a few days. EXAM: PORTABLE CHEST 1 VIEW COMPARISON:  12/13/2016 chest radiographs and earlier. FINDINGS: Portable AP semi upright view at 1347 hours. Stable lung volumes and mediastinal contours with mildly tortuous aorta. Calcified aortic atherosclerosis. Visualized tracheal air column is within normal limits. There is an indistinct 8 millimeter nodular opacity projecting in the left lung (arrow). Otherwise allowing for portable technique the lungs are clear. No pulmonary edema or pneumothorax. No acute osseous abnormality identified. Negative visible bowel gas pattern. IMPRESSION: 1. Questionable small mid left lung nodule versus artifact. 2. No other acute cardiopulmonary abnormality. Electronically Signed   By: Odessa Fleming M.D.   On: 01/25/2019 13:59    DISCHARGE EXAMINATION: Vitals:   01/26/19 2024 01/27/19 0535 01/27/19 0605 01/27/19 0813  BP: (!) 132/91  128/79 114/70  Pulse: 84  71 65  Resp: 20     Temp:   98.4 F (36.9 C) 98.4 F (36.9 C)  TempSrc: Oral  Oral Oral  SpO2: 96%  93% 92%  Weight:  69.4 kg    Height:   (1.575 m)     General appearance: Awake alert.  In no distress.  Mildly distracted Resp: Clear to auscultation bilaterally.  Normal effort Cardio: S1-S2 is normal regular.  No S3-S4.  No rubs murmurs or bruit GI: Abdomen is soft.  Nontender nondistended.  Bowel sounds are  present normal.  No masses organomegaly Extremities: No edema.  Full range of motion of lower extremities. Neurologic:  No focal neurological deficits.    DISPOSITION: SNF  Discharge Instructions    Call MD for:  extreme fatigue   Complete by:  As directed    Call MD for:  persistant dizziness or light-headedness   Complete by:  As directed    Call MD for:  persistant nausea and vomiting   Complete by:  As directed    Call MD for:  severe uncontrolled pain   Complete by:  As directed    Call MD for:  temperature >100.4   Complete by:  As directed    Diet - low sodium heart healthy   Complete by:  As directed    Discharge instructions  Complete by:  As directed    Please review instructions on the discharge summary.  You were cared for by a hospitalist during your hospital stay. If you have any questions about your discharge medications or the care you received while you were in the hospital after you are discharged, you can call the unit and asked to speak with the hospitalist on call if the hospitalist that took care of you is not available. Once you are discharged, your primary care physician will handle any further medical issues. Please note that NO REFILLS for any discharge medications will be authorized once you are discharged, as it is imperative that you return to your primary care physician (or establish a relationship with a primary care physician if you do not have one) for your aftercare needs so that they can reassess your need for medications and monitor your lab values. If you do not have a primary care physician, you can call [phone] for a physician referral.   Increase activity slowly   Complete by:  As directed         Allergies as of 01/27/2019      Reactions   Buspar [buspirone] Hives   Percocet [oxycodone-acetaminophen] Other (See Comments)   Hallucinations    Vicodin [hydrocodone-acetaminophen] Other (See Comments)   Hallucinations       Medication List     STOP taking these medications   ipratropium 17 MCG/ACT inhaler Commonly known as:  Atrovent HFA     TAKE these medications   acetaminophen 500 MG tablet Commonly known as:  TYLENOL Take 1,000 mg by mouth 3 (three) times daily as needed for mild pain.   albuterol 108 (90 Base) MCG/ACT inhaler Commonly known as:  PROVENTIL HFA;VENTOLIN HFA Inhale 2 puffs into the lungs every 6 (six) hours as needed for wheezing or shortness of breath.   amLODipine 10 MG tablet Commonly known as:  NORVASC Take 10 mg by mouth daily.   amoxicillin-clavulanate 875-125 MG tablet Commonly known as:  AUGMENTIN Take 1 tablet by mouth 2 (two) times daily. Start 01-21-19 Ends 01-28-19   atenolol 50 MG tablet Commonly known as:  TENORMIN Take 50 mg by mouth daily.   atorvastatin 10 MG tablet Commonly known as:  LIPITOR Take 10 mg by mouth daily.   Creon 36000 UNITS Cpep capsule Generic drug:  lipase/protease/amylase Take 36,000 Units by mouth 3 (three) times daily before meals.   dicyclomine 10 MG capsule Commonly known as:  BENTYL Take 10 mg by mouth 4 (four) times daily -  before meals and at bedtime.   divalproex 125 MG DR tablet Commonly known as:  DEPAKOTE Take 375 mg by mouth 3 (three) times daily.   donepezil 10 MG tablet Commonly known as:  ARICEPT Take 10 mg by mouth at bedtime.   DULoxetine 30 MG capsule Commonly known as:  CYMBALTA Take 30 mg by mouth daily.   esomeprazole 40 MG capsule Commonly known as:  NEXIUM Take 40 mg by mouth daily before breakfast.   furosemide 20 MG tablet Commonly known as:  LASIX Take 20 mg by mouth daily.   gabapentin 300 MG capsule Commonly known as:  NEURONTIN Take 300 mg by mouth daily.   hydrocortisone cream 1 % Apply 1 application topically 2 (two) times daily as needed for itching (to buttocks).   ipratropium-albuterol 0.5-2.5 (3) MG/3ML Soln Commonly known as:  DUONEB Take 3 mLs by nebulization every 4 (four) hours as needed  (SOB). Ends 01-27-19 What changed:  when to take this  reasons to take this   loperamide 2 MG capsule Commonly known as:  IMODIUM Take 2 mg by mouth 4 (four) times daily as needed for diarrhea or loose stools.   LORazepam 0.5 MG tablet Commonly known as:  ATIVAN Take 0.5 tablets (0.25 mg total) by mouth 2 (two) times daily.   lubiprostone 24 MCG capsule Commonly known as:  AMITIZA Take 24 mcg by mouth 2 (two) times daily with a meal.   memantine 10 MG tablet Commonly known as:  NAMENDA Take 10 mg by mouth 2 (two) times daily.   potassium chloride 20 MEQ packet Commonly known as:  KLOR-CON Take 20 mEq by mouth daily.   ramipril 10 MG capsule Commonly known as:  ALTACE Take 10 mg by mouth daily.   Robafen DM Cgh/Chest Congest 10-100 MG/5ML liquid Generic drug:  dextromethorphan-guaiFENesin Take 15 mLs by mouth 3 (three) times daily. For 7 days Starts on 01-20-19 Ends on 01-27-19   Vitamin D (Ergocalciferol) 1.25 MG (50000 UT) Caps capsule Commonly known as:  DRISDOL Take 50,000 Units by mouth See admin instructions. MON and THUR         Contact information for follow-up providers    Reed, Tiffany L, DO.   Specialty:  Geriatric Medicine Why:  As needed Contact information: [address] [place] [address] [phone]            Contact information for after-discharge care    Destination    [facility] .   Service:  Skilled Nursing Contact information: [address] [place] [address] [phone]                  TOTAL DISCHARGE TIME: 35 minutes  Makara Lanzo Rito Ehrlich  [facility] Pager on [email or web address]  01/27/2019, 9:55 AM

## 2019-01-27 NOTE — TOC Transition Note (Signed)
Transition of Care [facility]) - CM/SW Discharge Note   Patient Details  Name: FAVOR MASSOTH MRN: [id number] Date of Birth: 02/28/1941  [facility] CM/SW Contact:  Abigail Butts, LCSW Phone Number: 01/27/2019, 10:34 AM   Clinical Narrative:    Patient will discharge to [facility].  Nurse to call report to [phone]. Patient will go to room 401A at the facility.  CSW signing off.    Final next level of care: Skilled Nursing Facility Barriers to Discharge: No Barriers Identified   Patient Goals and CMS Choice Patient states their goals for this hospitalization and ongoing recovery are:: to return to SNF CMS Medicare.gov Compare Post Acute Care list provided to:: Patient Represenative (must comment)(daughter) Choice offered to / list presented to : Adult Children  Discharge Placement   Existing PASRR number confirmed : 01/26/19          Patient chooses bed at: [facility] Patient to be transferred to facility by: [facility] Name of family member notified: [name], daughter Patient and family notified of of transfer: 01/27/19  Discharge Plan and Services   Post Acute Care Choice: Skilled Nursing Facility                    Social Determinants of Health (SDOH) Interventions     Readmission Risk Interventions No flowsheet data found.

## 2019-01-27 NOTE — Care Management Important Message (Signed)
Important Message  Patient Details  Name: Amanda Adkins MRN: [id number] Date of Birth: February 25, 1941   Medicare Important Message Given:  Yes    Gala Lewandowsky, RN 01/27/2019, 10:15 AM

## 2019-01-28 DIAGNOSIS — E559 Vitamin D deficiency, unspecified: Secondary | ICD-10-CM | POA: Diagnosis not present

## 2019-01-28 DIAGNOSIS — R569 Unspecified convulsions: Secondary | ICD-10-CM | POA: Diagnosis not present

## 2019-01-28 DIAGNOSIS — E039 Hypothyroidism, unspecified: Secondary | ICD-10-CM | POA: Diagnosis not present

## 2019-01-28 DIAGNOSIS — A419 Sepsis, unspecified organism: Secondary | ICD-10-CM | POA: Diagnosis not present

## 2019-01-28 DIAGNOSIS — J449 Chronic obstructive pulmonary disease, unspecified: Secondary | ICD-10-CM | POA: Diagnosis not present

## 2019-01-28 DIAGNOSIS — I471 Supraventricular tachycardia: Secondary | ICD-10-CM | POA: Diagnosis not present

## 2019-01-28 DIAGNOSIS — G309 Alzheimer's disease, unspecified: Secondary | ICD-10-CM | POA: Diagnosis not present

## 2019-01-28 DIAGNOSIS — R262 Difficulty in walking, not elsewhere classified: Secondary | ICD-10-CM | POA: Diagnosis not present

## 2019-01-28 DIAGNOSIS — K219 Gastro-esophageal reflux disease without esophagitis: Secondary | ICD-10-CM | POA: Diagnosis not present

## 2019-01-28 DIAGNOSIS — E119 Type 2 diabetes mellitus without complications: Secondary | ICD-10-CM | POA: Diagnosis not present

## 2019-01-28 DIAGNOSIS — R633 Feeding difficulties: Secondary | ICD-10-CM | POA: Diagnosis not present

## 2019-01-28 DIAGNOSIS — R1311 Dysphagia, oral phase: Secondary | ICD-10-CM | POA: Diagnosis not present

## 2019-01-28 DIAGNOSIS — I10 Essential (primary) hypertension: Secondary | ICD-10-CM | POA: Diagnosis not present

## 2019-01-28 DIAGNOSIS — J189 Pneumonia, unspecified organism: Secondary | ICD-10-CM | POA: Diagnosis not present

## 2019-01-28 DIAGNOSIS — M6281 Muscle weakness (generalized): Secondary | ICD-10-CM | POA: Diagnosis not present

## 2019-01-28 DIAGNOSIS — R278 Other lack of coordination: Secondary | ICD-10-CM | POA: Diagnosis not present

## 2019-01-28 DIAGNOSIS — M19019 Primary osteoarthritis, unspecified shoulder: Secondary | ICD-10-CM | POA: Diagnosis not present

## 2019-01-28 DIAGNOSIS — R2689 Other abnormalities of gait and mobility: Secondary | ICD-10-CM | POA: Diagnosis not present

## 2019-01-28 DIAGNOSIS — D649 Anemia, unspecified: Secondary | ICD-10-CM | POA: Diagnosis not present

## 2019-01-28 DIAGNOSIS — E785 Hyperlipidemia, unspecified: Secondary | ICD-10-CM | POA: Diagnosis not present

## 2019-01-28 DIAGNOSIS — E1140 Type 2 diabetes mellitus with diabetic neuropathy, unspecified: Secondary | ICD-10-CM | POA: Diagnosis not present

## 2019-01-31 DIAGNOSIS — M19019 Primary osteoarthritis, unspecified shoulder: Secondary | ICD-10-CM | POA: Diagnosis not present

## 2019-01-31 DIAGNOSIS — E785 Hyperlipidemia, unspecified: Secondary | ICD-10-CM | POA: Diagnosis not present

## 2019-01-31 DIAGNOSIS — M6281 Muscle weakness (generalized): Secondary | ICD-10-CM | POA: Diagnosis not present

## 2019-01-31 DIAGNOSIS — K219 Gastro-esophageal reflux disease without esophagitis: Secondary | ICD-10-CM | POA: Diagnosis not present

## 2019-01-31 DIAGNOSIS — E1140 Type 2 diabetes mellitus with diabetic neuropathy, unspecified: Secondary | ICD-10-CM | POA: Diagnosis not present

## 2019-01-31 DIAGNOSIS — I10 Essential (primary) hypertension: Secondary | ICD-10-CM | POA: Diagnosis not present

## 2019-01-31 DIAGNOSIS — R278 Other lack of coordination: Secondary | ICD-10-CM | POA: Diagnosis not present

## 2019-01-31 DIAGNOSIS — E119 Type 2 diabetes mellitus without complications: Secondary | ICD-10-CM | POA: Diagnosis not present

## 2019-01-31 DIAGNOSIS — R2689 Other abnormalities of gait and mobility: Secondary | ICD-10-CM | POA: Diagnosis not present

## 2019-01-31 DIAGNOSIS — R262 Difficulty in walking, not elsewhere classified: Secondary | ICD-10-CM | POA: Diagnosis not present

## 2019-01-31 DIAGNOSIS — J189 Pneumonia, unspecified organism: Secondary | ICD-10-CM | POA: Diagnosis not present

## 2019-01-31 DIAGNOSIS — R633 Feeding difficulties: Secondary | ICD-10-CM | POA: Diagnosis not present

## 2019-01-31 DIAGNOSIS — R1311 Dysphagia, oral phase: Secondary | ICD-10-CM | POA: Diagnosis not present

## 2019-01-31 DIAGNOSIS — I471 Supraventricular tachycardia: Secondary | ICD-10-CM | POA: Diagnosis not present

## 2019-01-31 DIAGNOSIS — G309 Alzheimer's disease, unspecified: Secondary | ICD-10-CM | POA: Diagnosis not present

## 2019-02-01 DIAGNOSIS — M6281 Muscle weakness (generalized): Secondary | ICD-10-CM | POA: Diagnosis not present

## 2019-02-01 DIAGNOSIS — I10 Essential (primary) hypertension: Secondary | ICD-10-CM | POA: Diagnosis not present

## 2019-02-01 DIAGNOSIS — R2689 Other abnormalities of gait and mobility: Secondary | ICD-10-CM | POA: Diagnosis not present

## 2019-02-01 DIAGNOSIS — R633 Feeding difficulties: Secondary | ICD-10-CM | POA: Diagnosis not present

## 2019-02-01 DIAGNOSIS — J189 Pneumonia, unspecified organism: Secondary | ICD-10-CM | POA: Diagnosis not present

## 2019-02-01 DIAGNOSIS — R262 Difficulty in walking, not elsewhere classified: Secondary | ICD-10-CM | POA: Diagnosis not present

## 2019-02-01 DIAGNOSIS — R278 Other lack of coordination: Secondary | ICD-10-CM | POA: Diagnosis not present

## 2019-02-01 DIAGNOSIS — E785 Hyperlipidemia, unspecified: Secondary | ICD-10-CM | POA: Diagnosis not present

## 2019-02-01 DIAGNOSIS — R1311 Dysphagia, oral phase: Secondary | ICD-10-CM | POA: Diagnosis not present

## 2019-02-01 DIAGNOSIS — M19019 Primary osteoarthritis, unspecified shoulder: Secondary | ICD-10-CM | POA: Diagnosis not present

## 2019-02-01 DIAGNOSIS — E119 Type 2 diabetes mellitus without complications: Secondary | ICD-10-CM | POA: Diagnosis not present

## 2019-02-01 DIAGNOSIS — K219 Gastro-esophageal reflux disease without esophagitis: Secondary | ICD-10-CM | POA: Diagnosis not present

## 2019-02-01 DIAGNOSIS — G309 Alzheimer's disease, unspecified: Secondary | ICD-10-CM | POA: Diagnosis not present

## 2019-02-02 DIAGNOSIS — M6281 Muscle weakness (generalized): Secondary | ICD-10-CM | POA: Diagnosis not present

## 2019-02-02 DIAGNOSIS — K219 Gastro-esophageal reflux disease without esophagitis: Secondary | ICD-10-CM | POA: Diagnosis not present

## 2019-02-02 DIAGNOSIS — R1311 Dysphagia, oral phase: Secondary | ICD-10-CM | POA: Diagnosis not present

## 2019-02-02 DIAGNOSIS — J189 Pneumonia, unspecified organism: Secondary | ICD-10-CM | POA: Diagnosis not present

## 2019-02-02 DIAGNOSIS — E785 Hyperlipidemia, unspecified: Secondary | ICD-10-CM | POA: Diagnosis not present

## 2019-02-02 DIAGNOSIS — I10 Essential (primary) hypertension: Secondary | ICD-10-CM | POA: Diagnosis not present

## 2019-02-02 DIAGNOSIS — M19019 Primary osteoarthritis, unspecified shoulder: Secondary | ICD-10-CM | POA: Diagnosis not present

## 2019-02-02 DIAGNOSIS — E119 Type 2 diabetes mellitus without complications: Secondary | ICD-10-CM | POA: Diagnosis not present

## 2019-02-02 DIAGNOSIS — R278 Other lack of coordination: Secondary | ICD-10-CM | POA: Diagnosis not present

## 2019-02-02 DIAGNOSIS — G309 Alzheimer's disease, unspecified: Secondary | ICD-10-CM | POA: Diagnosis not present

## 2019-02-02 DIAGNOSIS — R262 Difficulty in walking, not elsewhere classified: Secondary | ICD-10-CM | POA: Diagnosis not present

## 2019-02-02 DIAGNOSIS — R2689 Other abnormalities of gait and mobility: Secondary | ICD-10-CM | POA: Diagnosis not present

## 2019-02-02 DIAGNOSIS — R633 Feeding difficulties: Secondary | ICD-10-CM | POA: Diagnosis not present

## 2019-02-03 DIAGNOSIS — M19019 Primary osteoarthritis, unspecified shoulder: Secondary | ICD-10-CM | POA: Diagnosis not present

## 2019-02-03 DIAGNOSIS — R633 Feeding difficulties: Secondary | ICD-10-CM | POA: Diagnosis not present

## 2019-02-03 DIAGNOSIS — R2689 Other abnormalities of gait and mobility: Secondary | ICD-10-CM | POA: Diagnosis not present

## 2019-02-03 DIAGNOSIS — E119 Type 2 diabetes mellitus without complications: Secondary | ICD-10-CM | POA: Diagnosis not present

## 2019-02-03 DIAGNOSIS — K219 Gastro-esophageal reflux disease without esophagitis: Secondary | ICD-10-CM | POA: Diagnosis not present

## 2019-02-03 DIAGNOSIS — M6281 Muscle weakness (generalized): Secondary | ICD-10-CM | POA: Diagnosis not present

## 2019-02-03 DIAGNOSIS — I10 Essential (primary) hypertension: Secondary | ICD-10-CM | POA: Diagnosis not present

## 2019-02-03 DIAGNOSIS — R278 Other lack of coordination: Secondary | ICD-10-CM | POA: Diagnosis not present

## 2019-02-03 DIAGNOSIS — R1311 Dysphagia, oral phase: Secondary | ICD-10-CM | POA: Diagnosis not present

## 2019-02-03 DIAGNOSIS — E785 Hyperlipidemia, unspecified: Secondary | ICD-10-CM | POA: Diagnosis not present

## 2019-02-03 DIAGNOSIS — J189 Pneumonia, unspecified organism: Secondary | ICD-10-CM | POA: Diagnosis not present

## 2019-02-03 DIAGNOSIS — R262 Difficulty in walking, not elsewhere classified: Secondary | ICD-10-CM | POA: Diagnosis not present

## 2019-02-03 DIAGNOSIS — G309 Alzheimer's disease, unspecified: Secondary | ICD-10-CM | POA: Diagnosis not present

## 2019-02-04 DIAGNOSIS — J189 Pneumonia, unspecified organism: Secondary | ICD-10-CM | POA: Diagnosis not present

## 2019-02-04 DIAGNOSIS — M6281 Muscle weakness (generalized): Secondary | ICD-10-CM | POA: Diagnosis not present

## 2019-02-04 DIAGNOSIS — R2689 Other abnormalities of gait and mobility: Secondary | ICD-10-CM | POA: Diagnosis not present

## 2019-02-04 DIAGNOSIS — G309 Alzheimer's disease, unspecified: Secondary | ICD-10-CM | POA: Diagnosis not present

## 2019-02-04 DIAGNOSIS — E119 Type 2 diabetes mellitus without complications: Secondary | ICD-10-CM | POA: Diagnosis not present

## 2019-02-04 DIAGNOSIS — R262 Difficulty in walking, not elsewhere classified: Secondary | ICD-10-CM | POA: Diagnosis not present

## 2019-02-04 DIAGNOSIS — R633 Feeding difficulties: Secondary | ICD-10-CM | POA: Diagnosis not present

## 2019-02-04 DIAGNOSIS — K219 Gastro-esophageal reflux disease without esophagitis: Secondary | ICD-10-CM | POA: Diagnosis not present

## 2019-02-04 DIAGNOSIS — R1311 Dysphagia, oral phase: Secondary | ICD-10-CM | POA: Diagnosis not present

## 2019-02-04 DIAGNOSIS — I10 Essential (primary) hypertension: Secondary | ICD-10-CM | POA: Diagnosis not present

## 2019-02-04 DIAGNOSIS — R278 Other lack of coordination: Secondary | ICD-10-CM | POA: Diagnosis not present

## 2019-02-04 DIAGNOSIS — M19019 Primary osteoarthritis, unspecified shoulder: Secondary | ICD-10-CM | POA: Diagnosis not present

## 2019-02-04 DIAGNOSIS — E785 Hyperlipidemia, unspecified: Secondary | ICD-10-CM | POA: Diagnosis not present

## 2019-02-07 ENCOUNTER — Inpatient Hospital Stay (HOSPITAL_COMMUNITY): Payer: Medicare Other

## 2019-02-07 ENCOUNTER — Inpatient Hospital Stay (HOSPITAL_COMMUNITY)
Admission: EM | Admit: 2019-02-07 | Discharge: 2019-02-09 | DRG: 296 | Disposition: E | Payer: Medicare Other | Source: Skilled Nursing Facility | Attending: Internal Medicine | Admitting: Internal Medicine

## 2019-02-07 ENCOUNTER — Emergency Department (HOSPITAL_COMMUNITY): Payer: Medicare Other

## 2019-02-07 DIAGNOSIS — Z515 Encounter for palliative care: Secondary | ICD-10-CM | POA: Diagnosis not present

## 2019-02-07 DIAGNOSIS — R269 Unspecified abnormalities of gait and mobility: Secondary | ICD-10-CM | POA: Diagnosis present

## 2019-02-07 DIAGNOSIS — R4020 Unspecified coma: Secondary | ICD-10-CM | POA: Diagnosis not present

## 2019-02-07 DIAGNOSIS — R0689 Other abnormalities of breathing: Secondary | ICD-10-CM | POA: Diagnosis not present

## 2019-02-07 DIAGNOSIS — M810 Age-related osteoporosis without current pathological fracture: Secondary | ICD-10-CM | POA: Diagnosis not present

## 2019-02-07 DIAGNOSIS — K219 Gastro-esophageal reflux disease without esophagitis: Secondary | ICD-10-CM | POA: Diagnosis present

## 2019-02-07 DIAGNOSIS — I469 Cardiac arrest, cause unspecified: Principal | ICD-10-CM | POA: Diagnosis present

## 2019-02-07 DIAGNOSIS — Z87891 Personal history of nicotine dependence: Secondary | ICD-10-CM

## 2019-02-07 DIAGNOSIS — R633 Feeding difficulties: Secondary | ICD-10-CM | POA: Diagnosis not present

## 2019-02-07 DIAGNOSIS — F419 Anxiety disorder, unspecified: Secondary | ICD-10-CM | POA: Diagnosis present

## 2019-02-07 DIAGNOSIS — Z66 Do not resuscitate: Secondary | ICD-10-CM | POA: Diagnosis not present

## 2019-02-07 DIAGNOSIS — R7989 Other specified abnormal findings of blood chemistry: Secondary | ICD-10-CM | POA: Diagnosis present

## 2019-02-07 DIAGNOSIS — E872 Acidosis, unspecified: Secondary | ICD-10-CM

## 2019-02-07 DIAGNOSIS — J189 Pneumonia, unspecified organism: Secondary | ICD-10-CM | POA: Diagnosis not present

## 2019-02-07 DIAGNOSIS — R402312 Coma scale, best motor response, none, at arrival to emergency department: Secondary | ICD-10-CM | POA: Diagnosis not present

## 2019-02-07 DIAGNOSIS — E785 Hyperlipidemia, unspecified: Secondary | ICD-10-CM | POA: Diagnosis present

## 2019-02-07 DIAGNOSIS — F4310 Post-traumatic stress disorder, unspecified: Secondary | ICD-10-CM | POA: Diagnosis present

## 2019-02-07 DIAGNOSIS — K5909 Other constipation: Secondary | ICD-10-CM | POA: Diagnosis not present

## 2019-02-07 DIAGNOSIS — G8929 Other chronic pain: Secondary | ICD-10-CM | POA: Diagnosis not present

## 2019-02-07 DIAGNOSIS — J9601 Acute respiratory failure with hypoxia: Secondary | ICD-10-CM | POA: Diagnosis not present

## 2019-02-07 DIAGNOSIS — R278 Other lack of coordination: Secondary | ICD-10-CM | POA: Diagnosis not present

## 2019-02-07 DIAGNOSIS — R402112 Coma scale, eyes open, never, at arrival to emergency department: Secondary | ICD-10-CM | POA: Diagnosis not present

## 2019-02-07 DIAGNOSIS — Z885 Allergy status to narcotic agent status: Secondary | ICD-10-CM

## 2019-02-07 DIAGNOSIS — H5703 Miosis: Secondary | ICD-10-CM | POA: Diagnosis present

## 2019-02-07 DIAGNOSIS — R262 Difficulty in walking, not elsewhere classified: Secondary | ICD-10-CM | POA: Diagnosis not present

## 2019-02-07 DIAGNOSIS — R2689 Other abnormalities of gait and mobility: Secondary | ICD-10-CM | POA: Diagnosis not present

## 2019-02-07 DIAGNOSIS — R402212 Coma scale, best verbal response, none, at arrival to emergency department: Secondary | ICD-10-CM | POA: Diagnosis present

## 2019-02-07 DIAGNOSIS — J301 Allergic rhinitis due to pollen: Secondary | ICD-10-CM | POA: Diagnosis present

## 2019-02-07 DIAGNOSIS — D72829 Elevated white blood cell count, unspecified: Secondary | ICD-10-CM

## 2019-02-07 DIAGNOSIS — R918 Other nonspecific abnormal finding of lung field: Secondary | ICD-10-CM | POA: Diagnosis not present

## 2019-02-07 DIAGNOSIS — R1311 Dysphagia, oral phase: Secondary | ICD-10-CM | POA: Diagnosis not present

## 2019-02-07 DIAGNOSIS — M19019 Primary osteoarthritis, unspecified shoulder: Secondary | ICD-10-CM | POA: Diagnosis not present

## 2019-02-07 DIAGNOSIS — Z20828 Contact with and (suspected) exposure to other viral communicable diseases: Secondary | ICD-10-CM | POA: Diagnosis present

## 2019-02-07 DIAGNOSIS — G309 Alzheimer's disease, unspecified: Secondary | ICD-10-CM | POA: Diagnosis not present

## 2019-02-07 DIAGNOSIS — E119 Type 2 diabetes mellitus without complications: Secondary | ICD-10-CM | POA: Diagnosis present

## 2019-02-07 DIAGNOSIS — I10 Essential (primary) hypertension: Secondary | ICD-10-CM | POA: Diagnosis present

## 2019-02-07 DIAGNOSIS — E1140 Type 2 diabetes mellitus with diabetic neuropathy, unspecified: Secondary | ICD-10-CM | POA: Diagnosis not present

## 2019-02-07 DIAGNOSIS — M6281 Muscle weakness (generalized): Secondary | ICD-10-CM | POA: Diagnosis not present

## 2019-02-07 DIAGNOSIS — R579 Shock, unspecified: Secondary | ICD-10-CM | POA: Diagnosis not present

## 2019-02-07 DIAGNOSIS — R404 Transient alteration of awareness: Secondary | ICD-10-CM | POA: Diagnosis not present

## 2019-02-07 DIAGNOSIS — Z8701 Personal history of pneumonia (recurrent): Secondary | ICD-10-CM

## 2019-02-07 DIAGNOSIS — Y95 Nosocomial condition: Secondary | ICD-10-CM | POA: Diagnosis present

## 2019-02-07 DIAGNOSIS — Z9181 History of falling: Secondary | ICD-10-CM

## 2019-02-07 DIAGNOSIS — Z79899 Other long term (current) drug therapy: Secondary | ICD-10-CM

## 2019-02-07 DIAGNOSIS — F0150 Vascular dementia without behavioral disturbance: Secondary | ICD-10-CM | POA: Diagnosis present

## 2019-02-07 DIAGNOSIS — I491 Atrial premature depolarization: Secondary | ICD-10-CM | POA: Diagnosis not present

## 2019-02-07 DIAGNOSIS — R1319 Other dysphagia: Secondary | ICD-10-CM | POA: Diagnosis not present

## 2019-02-07 DIAGNOSIS — I499 Cardiac arrhythmia, unspecified: Secondary | ICD-10-CM | POA: Diagnosis not present

## 2019-02-07 LAB — URINALYSIS, ROUTINE W REFLEX MICROSCOPIC
Bilirubin Urine: NEGATIVE
Glucose, UA: >=500 mg/dL — AB
Hgb urine dipstick: NEGATIVE
Ketones, ur: NEGATIVE mg/dL
LEUKOCYTE UA: NEGATIVE
Nitrite: NEGATIVE
Protein, ur: 100 mg/dL — AB
Specific Gravity, Urine: 1.01 (ref 1.005–1.030)
pH: 6 (ref 5.0–8.0)

## 2019-02-07 LAB — RAPID URINE DRUG SCREEN, HOSP PERFORMED
AMPHETAMINES: NOT DETECTED
Barbiturates: NOT DETECTED
Benzodiazepines: NOT DETECTED
Cocaine: NOT DETECTED
Opiates: NOT DETECTED
Tetrahydrocannabinol: NOT DETECTED

## 2019-02-07 LAB — POCT I-STAT 7, (LYTES, BLD GAS, ICA,H+H)
Acid-base deficit: 10 mmol/L — ABNORMAL HIGH (ref 0.0–2.0)
Acid-base deficit: 12 mmol/L — ABNORMAL HIGH (ref 0.0–2.0)
Bicarbonate: 23.6 mmol/L (ref 20.0–28.0)
Bicarbonate: 18.2 mmol/L — ABNORMAL LOW (ref 20.0–28.0)
CALCIUM ION: 1.25 mmol/L (ref 1.15–1.40)
Calcium, Ion: 1.16 mmol/L (ref 1.15–1.40)
HCT: 49 % — ABNORMAL HIGH (ref 36.0–46.0)
HCT: 43 % (ref 36.0–46.0)
Hemoglobin: 16.7 g/dL — ABNORMAL HIGH (ref 12.0–15.0)
Hemoglobin: 14.6 g/dL (ref 12.0–15.0)
O2 Saturation: 78 %
O2 Saturation: 69 %
Patient temperature: 99.1
Patient temperature: 99.1
Potassium: 3.9 mmol/L (ref 3.5–5.1)
Potassium: 3.5 mmol/L (ref 3.5–5.1)
Sodium: 140 mmol/L (ref 135–145)
Sodium: 141 mmol/L (ref 135–145)
TCO2: 26 mmol/L (ref 22–32)
TCO2: 20 mmol/L — ABNORMAL LOW (ref 22–32)
pCO2 arterial: 92.1 mmHg (ref 32.0–48.0)
pCO2 arterial: 60.7 mmHg — ABNORMAL HIGH (ref 32.0–48.0)
pH, Arterial: 7.019 — CL (ref 7.350–7.450)
pH, Arterial: 7.086 — CL (ref 7.350–7.450)
pO2, Arterial: 66 mmHg — ABNORMAL LOW (ref 83.0–108.0)
pO2, Arterial: 50 mmHg — ABNORMAL LOW (ref 83.0–108.0)

## 2019-02-07 LAB — COMPREHENSIVE METABOLIC PANEL
ALBUMIN: 2.6 g/dL — AB (ref 3.5–5.0)
ALT: 21 U/L (ref 0–44)
AST: 83 U/L — ABNORMAL HIGH (ref 15–41)
Alkaline Phosphatase: 109 U/L (ref 38–126)
Anion gap: 9 (ref 5–15)
BUN: 16 mg/dL (ref 8–23)
CHLORIDE: 105 mmol/L (ref 98–111)
CO2: 23 mmol/L (ref 22–32)
Calcium: 8.7 mg/dL — ABNORMAL LOW (ref 8.9–10.3)
Creatinine, Ser: 1.06 mg/dL — ABNORMAL HIGH (ref 0.44–1.00)
GFR calc Af Amer: 58 mL/min — ABNORMAL LOW (ref >60)
GFR calc non Af Amer: 50 mL/min — ABNORMAL LOW (ref >60)
Glucose, Bld: 270 mg/dL — ABNORMAL HIGH (ref 70–99)
Potassium: 4.8 mmol/L (ref 3.5–5.1)
Sodium: 137 mmol/L (ref 135–145)
Total Bilirubin: 0.9 mg/dL (ref 0.3–1.2)
Total Protein: 6.4 g/dL — ABNORMAL LOW (ref 6.5–8.1)

## 2019-02-07 LAB — CBC WITH DIFFERENTIAL/PLATELET
ABS IMMATURE GRANULOCYTES: 0 10*3/uL (ref 0.00–0.07)
Band Neutrophils: 1 %
Basophils Absolute: 0 10*3/uL (ref 0.0–0.1)
Basophils Relative: 0 %
Eosinophils Absolute: 0.8 10*3/uL — ABNORMAL HIGH (ref 0.0–0.5)
Eosinophils Relative: 2 %
HCT: 49 % — ABNORMAL HIGH (ref 36.0–46.0)
Hemoglobin: 15.1 g/dL — ABNORMAL HIGH (ref 12.0–15.0)
Lymphocytes Relative: 45 %
Lymphs Abs: 16.9 10*3/uL — ABNORMAL HIGH (ref 0.7–4.0)
MCH: 30.4 pg (ref 26.0–34.0)
MCHC: 30.8 g/dL (ref 30.0–36.0)
MCV: 98.6 fL (ref 80.0–100.0)
Monocytes Absolute: 3.4 10*3/uL — ABNORMAL HIGH (ref 0.1–1.0)
Monocytes Relative: 9 %
NEUTROS ABS: 16.5 10*3/uL — AB (ref 1.7–7.7)
NEUTROS PCT: 43 %
NRBC: 0 /100{WBCs}
Platelets: 183 10*3/uL (ref 150–400)
RBC: 4.97 MIL/uL (ref 3.87–5.11)
RDW: 15.7 % — ABNORMAL HIGH (ref 11.5–15.5)
WBC: 37.5 10*3/uL — ABNORMAL HIGH (ref 4.0–10.5)
nRBC: 0.2 % (ref 0.0–0.2)

## 2019-02-07 LAB — GLUCOSE, CAPILLARY: Glucose-Capillary: 247 mg/dL — ABNORMAL HIGH (ref 70–99)

## 2019-02-07 LAB — LACTIC ACID, PLASMA: Lactic Acid, Venous: 6.5 mmol/L (ref 0.5–1.9)

## 2019-02-07 LAB — BRAIN NATRIURETIC PEPTIDE: B NATRIURETIC PEPTIDE 5: 71 pg/mL (ref 0.0–100.0)

## 2019-02-07 LAB — TROPONIN I: Troponin I: 0.31 ng/mL (ref <0.03)

## 2019-02-07 MED ORDER — VANCOMYCIN HCL 10 G IV SOLR
1250.0000 mg | INTRAVENOUS | Status: DC
  Filled 2019-02-07: qty 1250

## 2019-02-07 MED ORDER — FENTANYL CITRATE (PF) 100 MCG/2ML IJ SOLN: 50.0000 ug | INTRAMUSCULAR | Status: DC | PRN

## 2019-02-07 MED ORDER — NALOXONE HCL 2 MG/2ML IJ SOSY
PREFILLED_SYRINGE | INTRAMUSCULAR | Status: AC | PRN
  Administered 2019-02-07: 2 mg via INTRAVENOUS

## 2019-02-07 MED ORDER — NALOXONE HCL 2 MG/2ML IJ SOSY
PREFILLED_SYRINGE | INTRAMUSCULAR | Status: AC
  Filled 2019-02-07: qty 2

## 2019-02-07 MED ORDER — FENTANYL CITRATE (PF) 100 MCG/2ML IJ SOLN
50.0000 ug | INTRAMUSCULAR | Status: DC | PRN
  Administered 2019-02-08: 50 ug via INTRAVENOUS

## 2019-02-07 MED ORDER — PIPERACILLIN-TAZOBACTAM 3.375 G IVPB 30 MIN
3.3750 g | Freq: Once | INTRAVENOUS | Status: AC
  Administered 2019-02-07: 3.375 g via INTRAVENOUS
  Filled 2019-02-07: qty 50

## 2019-02-07 MED ORDER — ROCURONIUM BROMIDE 50 MG/5ML IV SOLN
INTRAVENOUS | Status: AC | PRN
  Administered 2019-02-07: 80 mg via INTRAVENOUS

## 2019-02-07 MED ORDER — SODIUM CHLORIDE 0.9 % IV BOLUS
2000.0000 mL | Freq: Once | INTRAVENOUS | Status: AC
  Administered 2019-02-07: 2000 mL via INTRAVENOUS

## 2019-02-07 MED ORDER — HEPARIN SODIUM (PORCINE) 5000 UNIT/ML IJ SOLN
5000.0000 [IU] | Freq: Three times a day (TID) | INTRAMUSCULAR | Status: DC
  Administered 2019-02-08 (×2): 5000 [IU] via SUBCUTANEOUS
  Filled 2019-02-07 (×2): qty 1

## 2019-02-07 MED ORDER — INSULIN ASPART 100 UNIT/ML ~~LOC~~ SOLN
0.0000 [IU] | SUBCUTANEOUS | Status: DC
  Administered 2019-02-08: 2 [IU] via SUBCUTANEOUS
  Administered 2019-02-08: 3 [IU] via SUBCUTANEOUS

## 2019-02-07 MED ORDER — VANCOMYCIN HCL 10 G IV SOLR
1500.0000 mg | Freq: Once | INTRAVENOUS | Status: AC
  Administered 2019-02-07: 1500 mg via INTRAVENOUS
  Filled 2019-02-07: qty 1500

## 2019-02-07 MED ORDER — FENTANYL 2500MCG IN NS 250ML (10MCG/ML) PREMIX INFUSION
0.0000 ug/h | INTRAVENOUS | Status: DC
  Administered 2019-02-08: 50 ug/h via INTRAVENOUS
  Filled 2019-02-07: qty 250

## 2019-02-07 MED ORDER — SODIUM BICARBONATE 8.4 % IV SOLN
200.0000 meq | Freq: Once | INTRAVENOUS | Status: AC
  Administered 2019-02-08: 200 meq via INTRAVENOUS
  Filled 2019-02-07: qty 200

## 2019-02-07 MED ORDER — PIPERACILLIN-TAZOBACTAM 3.375 G IVPB
3.3750 g | Freq: Three times a day (TID) | INTRAVENOUS | Status: DC
  Administered 2019-02-08 (×2): 3.375 g via INTRAVENOUS
  Filled 2019-02-07 (×3): qty 50

## 2019-02-07 MED ORDER — IPRATROPIUM-ALBUTEROL 0.5-2.5 (3) MG/3ML IN SOLN
3.0000 mL | Freq: Four times a day (QID) | RESPIRATORY_TRACT | Status: DC
  Administered 2019-02-08 (×2): 3 mL via RESPIRATORY_TRACT
  Filled 2019-02-07 (×2): qty 3

## 2019-02-07 MED ORDER — ETOMIDATE 2 MG/ML IV SOLN
INTRAVENOUS | Status: AC | PRN
  Administered 2019-02-07: 10 mg via INTRAVENOUS

## 2019-02-07 MED ORDER — STERILE WATER FOR INJECTION IV SOLN
INTRAVENOUS | Status: DC
  Administered 2019-02-08 (×2): via INTRAVENOUS
  Filled 2019-02-07 (×4): qty 850

## 2019-02-07 NOTE — Progress Notes (Signed)
Pharmacy Antibiotic Note  Amanda Adkins is a 78 y.o. female admitted on 01/17/2019 with sepsis from possible pneumonia.  Pharmacy has been consulted for Zosyn and vancomycin dosing. SCr 1.06 (BL ~ 0.61). WBC 37.5. LA 6.5  Plan: -Zosyn 3.375 gm IV Q 8 hours (EI infusion) -Vancomycin 1500 mg IV once, then start Vancomycin 1250 mg IV Q 24 hrs. Goal AUC 400-550. Expected AUC: 488 SCr used: 1.06 -Monitor CBC, renal fx, cultures and clinical progress -Vanc peak/trough as indicated    Height: 5\' 2"  (157.5 cm) Weight: 153 lb (69.4 kg) IBW/kg (Calculated) : 50.1  No data recorded.  No results for input(s): WBC, CREATININE, LATICACIDVEN, VANCOTROUGH, VANCOPEAK, VANCORANDOM, GENTTROUGH, GENTPEAK, GENTRANDOM, TOBRATROUGH, TOBRAPEAK, TOBRARND, AMIKACINPEAK, AMIKACINTROU, AMIKACIN in the last 168 hours.  Estimated Creatinine Clearance: 52.9 mL/min (by C-G formula based on SCr of 0.61 mg/dL).    Allergies  Allergen Reactions  . Buspar [Buspirone] Hives  . Percocet [Oxycodone-Acetaminophen] Other (See Comments)    Hallucinations   . Vicodin [Hydrocodone-Acetaminophen] Other (See Comments)    Hallucinations     Antimicrobials this admission: Vanc 3/30 >>  Zosyn 3/30 >>    Thank you for allowing pharmacy to be a part of this patient's care.  Vinnie Level, PharmD., BCPS Clinical Pharmacist Clinical phone for 01/30/2019 until 10:30pm: [phone] If after 10:30pm, please refer to [facility] for unit-specific pharmacist

## 2019-02-07 NOTE — ED Provider Notes (Signed)
[facility] EMERGENCY DEPARTMENT Provider Note   CSN: [id number] Arrival date & time: 03/01/2019  3664    History   Chief Complaint Chief Complaint  Patient presents with  . Cardiac Arrest    HPI Amanda Adkins is a 78 y.o. female.     HPI    78 year old female with a history of vascular dementia, abnormal gait, chronic pain in her shoulders, diabetes, chronic constipation, osteoporosis, paroxysmal SVT, recent admission for SVT and flulike illness 3/19 recent pneumonia who presents as post-arrest from her SNF.  Per EMS report she had just completed antibiotics for pneumonia yesterday.  Per EMS, she is getting ready for dinner, when she suddenly became unresponsive.  They found her to not have a pulse, and started CPR.  She received 3 rounds of CPR at the facility, and then started breathing on her own.  On EMS arrival, she was breathing on her own, but with a GCS of 3.  They continued assisted BVM, and brought her to the emergency department.  They attempted giving her 2 mg of Narcan without improvement.  Her glucose was within normal limits.  Family reports when they FaceTime with her on Thursday or Friday she appeared to be in a normal state of health.  Past Medical History:  Diagnosis Date  . Abdominal pain, generalized   . Abdominal pain, generalized   . Abdominal pain, unspecified site   . Abnormality of gait   . Abnormality of gait   . Abnormality of gait   . Acute bronchitis   . Acute sinusitis, unspecified   . Allergic rhinitis due to pollen   . Allergic rhinitis due to pollen   . Allergy   . Anxiety   . Anxiety state, unspecified   . Candidiasis of vulva and vagina   . Cervicitis and endocervicitis   . Chronic pain of both shoulders   . Closed dislocation of shoulder, unspecified site   . Constipation, chronic   . Contact with or exposure to other communicable diseases(V01.89)   . Cough   . Dementia in conditions classified elsewhere without  behavioral disturbance   . Dermatitis   . Diabetes mellitus without complication (HCC)   . Diarrhea   . Diarrhea   . Diverticulosis of colon (without mention of hemorrhage)   . GERD (gastroesophageal reflux disease)   . Hearing loss sensory, bilateral   . Hepatomegaly   . Hepatomegaly   . History of fall   . Hypercalcemia   . Hyperlipidemia   . Hypertension   . Hypopotassemia   . Impacted cerumen   . Kyphosis (acquired) (postural)   . Leukocytosis, unspecified   . Muscle weakness (generalized)   . Osteoarthritis of both knees   . Osteoporosis   . Other and unspecified hyperlipidemia   . Other B-complex deficiencies   . Other malaise and fatigue   . Other malaise and fatigue   . Other psoriasis   . Other specified pre-operative examination   . Pain   . Pain in joint, lower leg   . Pain in joint, shoulder region   . Pain in joint, site unspecified   . Personal history of fall   . Posttraumatic stress disorder   . Posttraumatic stress disorder   . Psoriasis   . Reflux esophagitis   . Senile dementia, uncomplicated (HCC)   . Senile osteoporosis   . Senile osteoporosis   . Shoulder dislocation   . Stiffness of joint, not elsewhere classified, unspecified site   .  Tinnitus   . Tobacco abuse   . Tobacco use disorder   . Tobacco use disorder   . 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 Type II or unspecified type diabetes mellitus without mention of complication, not stated as uncontrolled   . Type II or unspecified type diabetes mellitus without mention of complication, uncontrolled   . Unspecified constipation   . Unspecified essential hypertension   . Unspecified pruritic disorder   . Vascular dementia (HCC)   . Vitamin B12 deficiency     Patient Active Problem List   Diagnosis Date Noted  . Influenza-like illness 01/25/2019  . SVT (supraventricular tachycardia) (HCC) 01/25/2019  . Hypokalemia  01/25/2019  . Candidal skin infection 02/02/2014  . 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 Pancreatic insufficiency 02/02/2014  . GERD (gastroesophageal reflux disease) 06/16/2013  . Weakness generalized 06/09/2013  . 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 Left patella fracture 04/07/2013  . Essential hypertension, benign   . Stiffness of joint, not elsewhere classified, unspecified site   . Pain in joint, site unspecified   . Chronic pain of both shoulders   . Tobacco abuse   . Osteoarthritis of both knees   . Shoulder dislocation   . Vascular dementia (HCC)   . DYSPNEA 09/24/2009    Past Surgical History:  Procedure Laterality Date  . BLADDER SURGERY    . ORIF PATELLA Left 04/08/2013   Procedure: OPEN REDUCTION INTERNAL (ORIF) FIXATION PATELLA;  Surgeon: Eldred Manges, MD;  Location: [facility] OR;  Service: Orthopedics;  Laterality: Left;  Open Reduction Internal Fixation Left Patella Fracture  . SPINE SURGERY    . tubual ligation       OB History   No obstetric history on file.      Home Medications    Prior to Admission medications   Medication Sig Start Date End Date Taking? Authorizing Provider  acetaminophen (TYLENOL) 500 MG tablet Take 1,000 mg by mouth 3 (three) times daily as needed for mild pain.     [provider]  albuterol (PROVENTIL HFA;VENTOLIN HFA) 108 (90 Base) MCG/ACT inhaler Inhale 2 puffs into the lungs every 6 (six) hours as needed for wheezing or shortness of breath.    [provider]  amLODipine (NORVASC) 10 MG tablet Take 10 mg by mouth daily.    [provider]  amoxicillin-clavulanate (AUGMENTIN) 875-125 MG tablet Take 1 tablet by mouth 2 (two) times daily. Start 01-21-19 Ends 01-28-19    [provider]  atenolol (TENORMIN) 50 MG tablet Take 50 mg by mouth daily.    [provider]  atorvastatin (LIPITOR) 10 MG tablet Take 10 mg by mouth daily.    [provider]  dextromethorphan-guaiFENesin (ROBAFEN DM CGH/CHEST CONGEST) 10-100 MG/5ML liquid Take 15 mLs by mouth 3 (three) times daily. For 7 days Starts on 01-20-19 Ends on 01-27-19    [provider]  dicyclomine (BENTYL) 10 MG capsule Take 10 mg by mouth 4 (four) times daily -  before meals and at bedtime.    [provider]  divalproex (DEPAKOTE) 125 MG DR tablet Take 375 mg by mouth 3 (three) times daily.     [provider]  donepezil (ARICEPT) 10 MG tablet Take 10 mg by mouth at bedtime.    [provider]  DULoxetine (CYMBALTA) 30 MG capsule  Take 30 mg by mouth daily.    [provider]  esomeprazole (NEXIUM) 40 MG capsule Take 40 mg by mouth daily before breakfast.    [provider]  furosemide (LASIX) 20 MG tablet Take 20 mg by mouth daily.    [provider]  gabapentin (NEURONTIN) 300 MG capsule Take 300 mg by mouth daily.    [provider]  hydrocortisone cream 1 % Apply 1 application topically 2 (two) times daily as needed for itching (to buttocks).    [provider]  ipratropium-albuterol (DUONEB) 0.5-2.5 (3) MG/3ML SOLN Take 3 mLs by nebulization every 4 (four) hours as needed (SOB). Ends 01-27-19 01/25/19   Mancel BaleWentz, Elliott, MD  lipase/protease/amylase (CREON) 36000 UNITS CPEP capsule Take 36,000 Units by mouth 3 (three) times daily before meals.    [provider]  loperamide (IMODIUM) 2 MG capsule Take 2 mg by mouth 4 (four) times daily as needed for diarrhea or loose stools.    [provider]  LORazepam (ATIVAN) 0.5 MG tablet Take 0.5 tablets (0.25 mg total) by mouth 2 (two) times daily. 01/27/19   Osvaldo ShipperKrishnan, Gokul, MD  lubiprostone (AMITIZA) 24 MCG capsule Take 24 mcg by mouth 2 (two) times daily with a meal.    [provider]  memantine (NAMENDA) 10 MG tablet Take 10 mg by mouth 2 (two)  times daily.    [provider]  potassium chloride (KLOR-CON) 20 MEQ packet Take 20 mEq by mouth daily.    [provider]  ramipril (ALTACE) 10 MG capsule Take 10 mg by mouth daily.    [provider]  Vitamin D, Ergocalciferol, (DRISDOL) 1.25 MG (50000 UT) CAPS capsule Take 50,000 Units by mouth See admin instructions. MON and THUR    [provider]    Family History No family history on file.  Social History Social History   Tobacco Use  . Smoking status: Former Smoker    Packs/day: 1.00    Years: 40.00    Pack years: 40.00    Types: Cigarettes  . Smokeless tobacco: Former NeurosurgeonUser    Quit date: 05/03/2013  Substance Use Topics  . Alcohol use: No  . Drug use: No     Allergies   Buspar [buspirone]; Percocet [oxycodone-acetaminophen]; and Vicodin [hydrocodone-acetaminophen]   Review of Systems Review of Systems  Unable to perform ROS: Patient unresponsive     Physical Exam Updated Vital Signs BP 120/83   Pulse (!) 136   Temp 99.1 F (37.3 C) (Rectal)   Resp 15   Ht 5\' 2"  (1.575 m)   Wt 69.4 kg   SpO2 100%   BMI 27.98 kg/m   Physical Exam Vitals signs and nursing note reviewed.  Constitutional:      Appearance: She is ill-appearing and toxic-appearing.  HENT:     Head: Normocephalic and atraumatic.  Eyes:     Pupils: Pupils are equal, round, and reactive to light.     Comments: Miosis  Cardiovascular:     Rate and Rhythm: Tachycardia present.     Heart sounds: No murmur. No friction rub. No gallop.   Pulmonary:     Breath sounds: Rales (right diffuse) present.     Comments: Diminished on left Neurological:     GCS: GCS eye subscore is 1. GCS verbal subscore is 1. GCS motor subscore is 1.      ED Treatments / Results  Labs (all labs ordered are listed, but only abnormal results are displayed) Labs  Reviewed  CBC WITH DIFFERENTIAL/PLATELET - Abnormal; Notable for the following components:      Result Value   WBC  37.5 (*)    Hemoglobin 15.1 (*)    HCT 49.0 (*)    RDW 15.7 (*)    Neutro Abs 16.5 (*)    Lymphs Abs 16.9 (*)    Monocytes Absolute 3.4 (*)    Eosinophils Absolute 0.8 (*)    All other components within normal limits  COMPREHENSIVE METABOLIC PANEL - Abnormal; Notable for the following components:   Glucose, Bld 270 (*)    Creatinine, Ser 1.06 (*)    Calcium 8.7 (*)    Total Protein 6.4 (*)    Albumin 2.6 (*)    AST 83 (*)    GFR calc non Af Amer 50 (*)    GFR calc Af Amer 58 (*)    All other components within normal limits  LACTIC ACID, PLASMA - Abnormal; Notable for the following components:   Lactic Acid, Venous 6.5 (*)    All other components within normal limits  TROPONIN I - Abnormal; Notable for the following components:   Troponin I 0.31 (*)    All other components within normal limits  URINALYSIS, ROUTINE W REFLEX MICROSCOPIC - Abnormal; Notable for the following components:   APPearance HAZY (*)    Glucose, UA >=500 (*)    Protein, ur 100 (*)    Bacteria, UA RARE (*)    All other components within normal limits  POCT I-STAT 7, (LYTES, BLD GAS, ICA,H+H) - Abnormal; Notable for the following components:   pH, Arterial 7.019 (*)    pCO2 arterial 92.1 (*)    pO2, Arterial 66.0 (*)    Acid-base deficit 10.0 (*)    HCT 49.0 (*)    Hemoglobin 16.7 (*)    All other components within normal limits  CULTURE, BLOOD (ROUTINE X 2)  CULTURE, BLOOD (ROUTINE X 2)  URINE CULTURE  RAPID URINE DRUG SCREEN, HOSP PERFORMED  LACTIC ACID, PLASMA  BRAIN NATRIURETIC PEPTIDE    EKG EKG Interpretation  Date/Time:  Monday Feb 23, 2019 18:55:04 EDT Ventricular Rate:  94 PR Interval:    QRS Duration: 88 QT Interval:  311 QTC Calculation: 389 R Axis:   59 Text Interpretation:  Sinus rhythm Probable left atrial enlargement Minimal ST elevation, anterolateral leads TW more peaked in anterolateral leads in comparison to prior Confirmed by Alvira Monday ([id number]) on February 23, 2019 7:37:26  PM   Radiology Dg Chest Portable 1 View  Result Date: 02/23/2019 CLINICAL DATA:  Intubation. EXAM: PORTABLE CHEST 1 VIEW COMPARISON:  01/25/2019 FINDINGS: Endotracheal tube has tip 4.8 cm above the carina. Enteric tube courses into the stomach as tip is not visualized. Lungs are adequately inflated with significant interval worsening of hazy airspace consolidation throughout the right lung greater than the central left lung. Findings likely due to infection. A component of interstitial edema is possible. No definite effusion. Cardiomediastinal silhouette and remainder the exam is unchanged. IMPRESSION: Significant interval worsening of hazy airspace opacification over the right lung greater than the central left lung likely infection. A component of interstitial edema is possible. Tubes and lines as described. Electronically Signed   By: Elberta Fortis M.D.   On: 2019/02/23 19:45    Procedures .Critical Care Performed by: Alvira Monday, MD Authorized by: Alvira Monday, MD   Critical care provider statement:    Critical care time (minutes):  60   Critical care was time spent personally by me  on the following activities:  Discussions with consultants, evaluation of patient's response to treatment, examination of patient, ordering and performing treatments and interventions, ordering and review of laboratory studies, ordering and review of radiographic studies, pulse oximetry, re-evaluation of patient's condition, obtaining history from patient or surrogate and review of old charts Procedure Name: Intubation Date/Time: 2019/02/21 8:31 PM Performed by: Alvira Monday, MD Pre-anesthesia Checklist: Patient identified Oxygen Delivery Method: Ambu bag Induction Type: Rapid sequence Ventilation: Mask ventilation without difficulty Laryngoscope Size: Glidescope Grade View: Grade I Tube size: 7.5 mm Number of attempts: 1      (including critical care time)  Medications Ordered in ED  Medications  vancomycin (VANCOCIN) 1,500 mg in sodium chloride 0.9 % 500 mL IVPB (1,500 mg Intravenous New Bag/Given 2019/02/21 2103)  piperacillin-tazobactam (ZOSYN) IVPB 3.375 g (has no administration in time range)  vancomycin (VANCOCIN) 1,250 mg in sodium chloride 0.9 % 250 mL IVPB (has no administration in time range)  naloxone [facility]) injection ( Intravenous Canceled Entry 02-21-19 2000)  rocuronium (ZEMURON) injection (80 mg Intravenous Given 2019-02-21 1908)  etomidate (AMIDATE) injection (10 mg Intravenous Given 02/21/2019 1907)  piperacillin-tazobactam (ZOSYN) IVPB 3.375 g (0 g Intravenous Stopped 02/21/2019 2102)     Initial Impression / Assessment and Plan / ED Course  I have reviewed the triage vital signs and the nursing notes.  Pertinent labs & imaging results that were available during my care of the patient were reviewed by me and considered in my medical decision making (see chart for details).        78 year old female with a history of vascular dementia, abnormal gait, chronic pain in her shoulders, diabetes, chronic constipation, osteoporosis, paroxysmal SVT, recent admission for SVT and flulike illness 3/19 recent pneumonia who presents as post-arrest from her SNF.  On arrival to the ED, she is GCS 3, breathing with assist.  Attempted narcan given significant miosis without response.s  She was intubated for airway protection and hypoxia with staff in PPE.  Pink frothy sputum on intubation.  CXR with significant hazy airspace opacity right lung greater than left, concerning for infection. Possible interstitial edema. WBC 37.  Concern for infectious etiology HCAP vs COVID19, less likely CHF or medication induced.   Blood gas with respiratory acidosis and hypoxia, increased PEEP and rate.    Troponin elevated. Will continue to follow in ICU.  EKG without STEMI.    Updated family.    Andretta Ergle was evaluated in Emergency Department on 3/30 for the symptoms described in the  history of present illness. He/she was evaluated in the context of the global COVID-19 pandemic, which necessitated consideration that the patient might be at risk for infection with the SARS-CoV-2 virus that causes COVID-19. Institutional protocols and algorithms that pertain to the evaluation of patients at risk for COVID-19 are in a state of rapid change based on information released by regulatory bodies including the CDC and federal and state organizations. These policies and algorithms were followed during the patient's care in the ED.      Final Clinical Impressions(s) / ED Diagnoses   Final diagnoses:  Lactic acidosis  Acute respiratory failure with hypoxia (HCC)  Cardiac arrest (HCC)  HCAP (healthcare-associated pneumonia)  Leukocytosis, unspecified type    ED Discharge Orders    None       Alvira Monday, MD 2019/02/21 2112

## 2019-02-07 NOTE — H&P (Addendum)
NAME:  Amanda Adkins, MRN:  [id number], DOB:  01-Apr-1941, LOS: 0 ADMISSION DATE:  02/03/2019, CONSULTATION DATE:  3/30 REFERRING MD:  Dr. Dalene Seltzer EDP, CHIEF COMPLAINT:  Cardiac arrest   Brief History   78 year old female from nursing home after cardiac arrest.  Unresponsive in the emergency department and intubated for airway protection.  Being treated for pneumonia with diffuse infiltrates will need to be ruled out for viral illness including COVID-19  History of present illness   78 year old female with past medical history as below, which is significant for dementia, diabetes, hypertension, and GERD.  She was admitted earlier this month with viral-like syndrome and SVT.  Symptoms improved with IV hydration.  She was discharged to skilled nursing facility at [facility] where she had been residing for some time prior.  She was again started on treatment for pneumonia at some time in the interim.  Then on 3/30 she became unresponsive just prior to eating dinner.  Nursing facility staff initiated CPR for a total of 3 rounds and spontaneous circulation was achieved.  Upon EMS arrival the patient was breathing spontaneously and was assisted with bag valve mask ventilations to the emergency department.  GCS was noted to be 3 she was intubated for airway protection after Narcan was ineffective.  Chest x-ray was done and demonstrated diffuse right greater than left infiltrates.  She was placed on airborne and contact precautions.  PCCM was asked to evaluate for admission.  Past Medical History   has a past medical history of Abdominal pain, generalized, Abdominal pain, generalized, Abdominal pain, unspecified site, Abnormality of gait, Abnormality of gait, Abnormality of gait, Acute bronchitis, Acute sinusitis, unspecified, Allergic rhinitis due to pollen, Allergic rhinitis due to pollen, Allergy, Anxiety, Anxiety state, unspecified, Candidiasis of vulva and vagina, Cervicitis and endocervicitis, Chronic  pain of both shoulders, Closed dislocation of shoulder, unspecified site, Constipation, chronic, Contact with or exposure to other communicable diseases(V01.89), Cough, Dementia in conditions classified elsewhere without behavioral disturbance, Dermatitis, Diabetes mellitus without complication (HCC), Diarrhea, Diarrhea, Diverticulosis of colon (without mention of hemorrhage), GERD (gastroesophageal reflux disease), Hearing loss sensory, bilateral, Hepatomegaly, Hepatomegaly, History of fall, Hypercalcemia, Hyperlipidemia, Hypertension, Hypopotassemia, Impacted cerumen, Kyphosis (acquired) (postural), Leukocytosis, unspecified, Muscle weakness (generalized), Osteoarthritis of both knees, Osteoporosis, Other and unspecified hyperlipidemia, Other B-complex deficiencies, Other malaise and fatigue, Other malaise and fatigue, Other psoriasis, Other specified pre-operative examination, Pain, Pain in joint, lower leg, Pain in joint, shoulder region, Pain in joint, site unspecified, Personal history of fall, Posttraumatic stress disorder, Posttraumatic stress disorder, Psoriasis, Reflux esophagitis, Senile dementia, uncomplicated (HCC), Senile osteoporosis, Senile osteoporosis, Shoulder dislocation, Stiffness of joint, not elsewhere classified, unspecified site, Tinnitus, Tobacco abuse, Tobacco use disorder, Tobacco use disorder, Type I (juvenile type) diabetes mellitus without mention of complication, not stated as uncontrolled, Type II or unspecified type diabetes mellitus without mention of complication, not stated as uncontrolled, Type II or unspecified type diabetes mellitus without mention of complication, uncontrolled, Unspecified constipation, Unspecified essential hypertension, Unspecified pruritic disorder, Vascular dementia (HCC), and Vitamin B12 deficiency.   Significant Hospital Events   3/17-3/19 admit for SVT, improved with hydration.  3/30 admit  Consults:    Procedures:  ETT 3/30  Significant  Diagnostic Tests:  CT head 3/30  Micro Data:  Blood Cx 3/30 > RVP 3/30 > Sputum 3/30 >  Antimicrobials:  Vancomycin 3/30 > Zosyn 3/30 >  Interim history/subjective:    Objective   Blood pressure 107/70, pulse (!) 119, temperature 99.1 F (37.3 C), temperature source  Rectal, resp. rate (!) 22, height  (1.575 m), weight 69.4 kg, SpO2 97 %.    Vent Mode: PRVC FiO2 (%):  [100 %] 100 % Set Rate:  [15 bmp-22 bmp] 22 bmp Vt Set:  [400 mL-520 mL] 400 mL PEEP:  [8 cmH20-10 cmH20] 10 cmH20 Plateau Pressure:  [25 cmH20-28 cmH20] 25 cmH20  No intake or output data in the 24 hours ending 02/06/2019 2125 Filed Weights   01/28/2019 1900  Weight: 69.4 kg    Examination: General: Elderly female on vent HENT: [place]/AT, no JVD Lungs: Coarse bilateral Cardiovascular: RRR, no MRG Abdomen: Soft, non-distened Extremities: NO acute deformity Neuro: Unresponsive. GCS 3.   Resolved Hospital Problem list     Assessment & Plan:   Cardiac arrest: etiology unclear. Diffuse infiltrates on CXR R>L. May have been primary respiratory, but infiltrates could be explained by peri-arrest aspiration. CT head pending. Troponin mildly elevated. EKG without evidence of acute ischemia. Electrolytes wnl.  - Admit to ICU - No role for TTM - EEG - MAP goal . Currently maintaining spontaneosly - EEG pending - Trend lactic acid and troponin.   Acute hypoxemic respiratory failure: secondary to  R>L diffuse infiltrates on CXR. Aspiration vs HCAP vs viral pneumonia. Felt to be low probability for COVID-19 infection, high risk transmission.  - Full vent support  - Airborne and contact precautions - ABG reviewed and vent settings adjusted - HCAP abx as above - Blood culture, sputum culture, strep, legionella, and RVP pending - If negative will need ID consult and likely COVID-19 rule out.  - VAP bundle - Follow ABG - CXR in AM  DM - CBG monitoring and SSI  Dementia - holding namenda, aricept,  depakote.   Hypertension - Holding home amlodipine, atenolol (may need to add this back as she has been tachycardic, will try to hydrate first), lasix, ramipril.    Best practice:  Diet: NPO Pain/Anxiety/Delirium protocol (if indicated): PRN sedation only for RASS goal 0 VAP protocol (if indicated): per protocol DVT prophylaxis: SQ heparin GI prophylaxis: Protonix Glucose control: SSI Mobility: BR Code Status: FULL Family Communication: Niece Tiffany updated via phone. She tells me that she and her mother are decision makers. Full code endorsed. I communicated poor prognosis of a meaningful outcome as Mrs. Dabbs has had dementia for years and is bed/chair bound, now suffering a cardiac arrest. They will contemplate GOC.  Disposition: ICU  Labs   CBC: Recent Labs  Lab 02/06/2019 1917 01/16/2019 2023  WBC 37.5*  --   NEUTROABS 16.5*  --   HGB 15.1* 16.7*  HCT 49.0* 49.0*  MCV 98.6  --   PLT 183  --     Basic Metabolic Panel: Recent Labs  Lab 01/12/2019 1917 01/11/2019 2023  NA 137 140  K 4.8 3.9  CL 105  --   CO2 23  --   GLUCOSE 270*  --   BUN 16  --   CREATININE 1.06*  --   CALCIUM 8.7*  --    GFR: Estimated Creatinine Clearance: 39.9 mL/min (A) (by C-G formula based on SCr of 1.06 mg/dL (H)). Recent Labs  Lab 01/11/2019 1917  WBC 37.5*  LATICACIDVEN 6.5*    Liver Function Tests: Recent Labs  Lab 01/14/2019 1917  AST 83*  ALT 21  ALKPHOS 109  BILITOT 0.9  PROT 6.4*  ALBUMIN 2.6*   No results for input(s): LIPASE, AMYLASE in the last 168 hours. No results for input(s): AMMONIA in the last 168  hours.  ABG    Component Value Date/Time   PHART 7.019 (LL) 01/28/2019 2023   PCO2ART 92.1 (HH) 01/10/2019 2023   PO2ART 66.0 (L) 01/25/2019 2023   HCO3 23.6 February 08, 2019 2023   TCO2 26 01/24/2019 2023   ACIDBASEDEF 10.0 (H) 01/21/2019 2023   O2SAT 78.0 Feb 08, 2019 2023     Coagulation Profile: No results for input(s): INR, PROTIME in the last 168  hours.  Cardiac Enzymes: Recent Labs  Lab Feb 08, 2019 1917  TROPONINI 0.31*    HbA1C: Hgb A1c MFr Bld  Date/Time Value Ref Range Status  02/01/2016 11:07 AM 6.2 (H) 4.8 - 5.6 % Final    Comment:             Pre-diabetes: 5.7 - 6.4          Diabetes: >6.4          Glycemic control for adults with diabetes: <7.0   09/21/2015 12:07 PM 6.4 (H) 4.8 - 5.6 % Final    Comment:             Pre-diabetes: 5.7 - 6.4          Diabetes: >6.4          Glycemic control for adults with diabetes: <7.0     CBG: No results for input(s): GLUCAP in the last 168 hours.  Review of Systems:   unable  Past Medical History  She,  has a past medical history of Abdominal pain, generalized, Abdominal pain, generalized, Abdominal pain, unspecified site, Abnormality of gait, Abnormality of gait, Abnormality of gait, Acute bronchitis, Acute sinusitis, unspecified, Allergic rhinitis due to pollen, Allergic rhinitis due to pollen, Allergy, Anxiety, Anxiety state, unspecified, Candidiasis of vulva and vagina, Cervicitis and endocervicitis, Chronic pain of both shoulders, Closed dislocation of shoulder, unspecified site, Constipation, chronic, Contact with or exposure to other communicable diseases(V01.89), Cough, Dementia in conditions classified elsewhere without behavioral disturbance, Dermatitis, Diabetes mellitus without complication (HCC), Diarrhea, Diarrhea, Diverticulosis of colon (without mention of hemorrhage), GERD (gastroesophageal reflux disease), Hearing loss sensory, bilateral, Hepatomegaly, Hepatomegaly, History of fall, Hypercalcemia, Hyperlipidemia, Hypertension, Hypopotassemia, Impacted cerumen, Kyphosis (acquired) (postural), Leukocytosis, unspecified, Muscle weakness (generalized), Osteoarthritis of both knees, Osteoporosis, Other and unspecified hyperlipidemia, Other B-complex deficiencies, Other malaise and fatigue, Other malaise and fatigue, Other psoriasis, Other specified pre-operative  examination, Pain, Pain in joint, lower leg, Pain in joint, shoulder region, Pain in joint, site unspecified, Personal history of fall, Posttraumatic stress disorder, Posttraumatic stress disorder, Psoriasis, Reflux esophagitis, Senile dementia, uncomplicated (HCC), Senile osteoporosis, Senile osteoporosis, Shoulder dislocation, Stiffness of joint, not elsewhere classified, unspecified site, Tinnitus, Tobacco abuse, Tobacco use disorder, Tobacco use disorder, Type I (juvenile type) diabetes mellitus without mention of complication, not stated as uncontrolled, Type II or unspecified type diabetes mellitus without mention of complication, not stated as uncontrolled, Type II or unspecified type diabetes mellitus without mention of complication, uncontrolled, Unspecified constipation, Unspecified essential hypertension, Unspecified pruritic disorder, Vascular dementia (HCC), and Vitamin B12 deficiency.   Surgical History    Past Surgical History:  Procedure Laterality Date   BLADDER SURGERY     ORIF PATELLA Left 04/08/2013   Procedure: OPEN REDUCTION INTERNAL (ORIF) FIXATION PATELLA;  Surgeon: Eldred Manges, MD;  Location: [facility] OR;  Service: Orthopedics;  Laterality: Left;  Open Reduction Internal Fixation Left Patella Fracture   SPINE SURGERY     tubual ligation       Social History   reports that she has quit smoking. Her smoking use included cigarettes. She  has a 40.00 pack-year smoking history. She quit smokeless tobacco use about 5 years ago. She reports that she does not drink alcohol or use drugs.   Family History   Her family history is not on file.   Allergies Allergies  Allergen Reactions   Buspar [Buspirone] Hives   Percocet [Oxycodone-Acetaminophen] Other (See Comments)    Hallucinations    Vicodin [Hydrocodone-Acetaminophen] Other (See Comments)    Hallucinations      Home Medications  Prior to Admission medications   Medication Sig Start Date End Date Taking? Authorizing  Provider  acetaminophen (TYLENOL) 500 MG tablet Take 1,000 mg by mouth 3 (three) times daily as needed for mild pain.     [provider]  albuterol (PROVENTIL HFA;VENTOLIN HFA) 108 (90 Base) MCG/ACT inhaler Inhale 2 puffs into the lungs every 6 (six) hours as needed for wheezing or shortness of breath.    [provider]  amLODipine (NORVASC) 10 MG tablet Take 10 mg by mouth daily.    [provider]  amoxicillin-clavulanate (AUGMENTIN) 875-125 MG tablet Take 1 tablet by mouth 2 (two) times daily. Start 01-21-19 Ends 01-28-19    [provider]  atenolol (TENORMIN) 50 MG tablet Take 50 mg by mouth daily.    [provider]  atorvastatin (LIPITOR) 10 MG tablet Take 10 mg by mouth daily.    [provider]  dextromethorphan-guaiFENesin (ROBAFEN DM [facility]GH/CHEST CONGEST) 10-100 MG/5ML liquid Take 15 mLs by mouth 3 (three) times daily. For 7 days Starts on 01-20-19 Ends on 01-27-19    [provider]  dicyclomine (BENTYL) 10 MG capsule Take 10 mg by mouth 4 (four) times daily -  before meals and at bedtime.    [provider]  divalproex (DEPAKOTE) 125 MG DR tablet Take 375 mg by mouth 3 (three) times daily.     [provider]  donepezil (ARICEPT) 10 MG tablet Take 10 mg by mouth at bedtime.    [provider]  DULoxetine (CYMBALTA) 30 MG capsule Take 30 mg by mouth daily.    [provider]  esomeprazole (NEXIUM) 40 MG capsule Take 40 mg by mouth daily before breakfast.    [provider]  furosemide (LASIX) 20 MG tablet Take 20 mg by mouth daily.    [provider]  gabapentin (NEURONTIN) 300 MG capsule Take 300 mg by mouth daily.    [provider]  hydrocortisone cream 1 % Apply 1 application topically 2 (two) times daily as needed for itching (to buttocks).    [provider]  ipratropium-albuterol (DUONEB) 0.5-2.5 (3) MG/3ML SOLN Take 3 mLs by nebulization every 4  (four) hours as needed (SOB). Ends 01-27-19 01/25/19   Mancel Bale, MD  lipase/protease/amylase (CREON) 36000 UNITS CPEP capsule Take 36,000 Units by mouth 3 (three) times daily before meals.    [provider]  loperamide (IMODIUM) 2 MG capsule Take 2 mg by mouth 4 (four) times daily as needed for diarrhea or loose stools.    [provider]  LORazepam (ATIVAN) 0.5 MG tablet Take 0.5 tablets (0.25 mg total) by mouth 2 (two) times daily. 01/27/19   Osvaldo Shipper, MD  lubiprostone (AMITIZA) 24 MCG capsule Take 24 mcg by mouth 2 (two) times daily with a meal.    [provider]  memantine (NAMENDA) 10 MG tablet Take 10 mg by mouth 2 (two) times daily.    [provider]  potassium chloride (KLOR-CON) 20 MEQ packet Take 20 mEq by  mouth daily.    [provider]  ramipril (ALTACE) 10 MG capsule Take 10 mg by mouth daily.    [provider]  Vitamin D, Ergocalciferol, (DRISDOL) 1.25 MG (50000 UT) CAPS capsule Take 50,000 Units by mouth See admin instructions. MON and THUR    [provider]     Critical care time: 50 mins     Joneen RoachPaul Hoffman, AGACNP-BC [facility]/Critical Care Pager [phone] or [phone]  06-22-2019 10:05 PM   Patient seen and examined, agree with above note.  I dictated the care and orders written for this patient under my direction. Cardiac arrest secondary to hypoxemia , multilobar HCAP , failed abx treatment NH resident RVP panel , if neg might need to r/o covid , will keep on isolation Poor prognosis Hx of end stage dementia , not a candidate for Hypothermia protocol  Mancel Parsonsosario, Mikea Quadros R, MD [phone]

## 2019-02-07 NOTE — ED Notes (Signed)
X RAY at bedside 

## 2019-02-07 NOTE — ED Triage Notes (Signed)
Per [facility] patient coming from [facility] for post CPR. Per staff patient was about to eat when she went pulseless and apneic. Staff performed 3 rounds of CPR when gained ROSC. EMS attempted airway but patient had gag reflex. Administered 2 mg narcan. Patient arrives on BVM. Report patient completed antibiotics yesterday for pneumonia.

## 2019-02-07 NOTE — Progress Notes (Signed)
ABG drawn, results called in to Dr. Dellie Catholic. PEEP increased to 12.  Recheck ABG at 0200.

## 2019-02-07 NOTE — Progress Notes (Signed)
eLink Physician-Brief Progress Note Patient Name: Amanda Adkins DOB: 09/01/1941 MRN: [id number]   Date of Service  02-17-2019  HPI/Events of Note  ABG on 100%/PRVC 22/TV 400/P 10 = 7.086/60.7/50/18.2. Patient is overbreathing the set ventilator rate at 32.   eICU Interventions  Will order: 1. Fentanyl IV infusion. Titrate to RASS = 0 to -1.  2. Increase PEEP to 12. 3. NaHCO3 200 meq IV now.  4. NaHCO3 IV infusion to run at 75 mL/hour. 5. Repeat ABG at 2 AM.     Intervention Category Major Interventions: Acid-Base disturbance - evaluation and management;Respiratory failure - evaluation and management  Sommer,Steven Dennard Nip 02/17/2019, 11:42 PM

## 2019-02-07 NOTE — ED Notes (Signed)
Update given to pt family. Report given to 62M

## 2019-02-07 NOTE — ED Notes (Signed)
Pt's niece Amanda Adkins [phone]

## 2019-02-07 NOTE — ED Notes (Signed)
Unable to obtain 2nd set of Blood Cultures

## 2019-02-07 NOTE — Progress Notes (Signed)
Patient transported from ED Resus to CT and then to 2M04 with no complications.

## 2019-02-08 ENCOUNTER — Inpatient Hospital Stay (HOSPITAL_COMMUNITY): Payer: Medicare Other

## 2019-02-08 DIAGNOSIS — J189 Pneumonia, unspecified organism: Secondary | ICD-10-CM

## 2019-02-08 DIAGNOSIS — J9601 Acute respiratory failure with hypoxia: Secondary | ICD-10-CM

## 2019-02-08 DIAGNOSIS — I469 Cardiac arrest, cause unspecified: Principal | ICD-10-CM

## 2019-02-08 LAB — GLUCOSE, CAPILLARY: Glucose-Capillary: 189 mg/dL — ABNORMAL HIGH (ref 70–99)

## 2019-02-08 LAB — POCT I-STAT 7, (LYTES, BLD GAS, ICA,H+H)
Acid-base deficit: 5 mmol/L — ABNORMAL HIGH (ref 0.0–2.0)
Acid-base deficit: 6 mmol/L — ABNORMAL HIGH (ref 0.0–2.0)
Acid-base deficit: 15 mmol/L — ABNORMAL HIGH (ref 0.0–2.0)
Acid-base deficit: 2 mmol/L (ref 0.0–2.0)
BICARBONATE: 23.1 mmol/L (ref 20.0–28.0)
Bicarbonate: 22.9 mmol/L (ref 20.0–28.0)
Bicarbonate: 12.3 mmol/L — ABNORMAL LOW (ref 20.0–28.0)
Bicarbonate: 24.2 mmol/L (ref 20.0–28.0)
Calcium, Ion: 1.11 mmol/L — ABNORMAL LOW (ref 1.15–1.40)
Calcium, Ion: 1.1 mmol/L — ABNORMAL LOW (ref 1.15–1.40)
Calcium, Ion: 0.81 mmol/L — CL (ref 1.15–1.40)
Calcium, Ion: 0.98 mmol/L — ABNORMAL LOW (ref 1.15–1.40)
HCT: 43 % (ref 36.0–46.0)
HCT: 43 % (ref 36.0–46.0)
HCT: 28 % — ABNORMAL LOW (ref 36.0–46.0)
HCT: 33 % — ABNORMAL LOW (ref 36.0–46.0)
Hemoglobin: 14.6 g/dL (ref 12.0–15.0)
Hemoglobin: 14.6 g/dL (ref 12.0–15.0)
Hemoglobin: 9.5 g/dL — ABNORMAL LOW (ref 12.0–15.0)
Hemoglobin: 11.2 g/dL — ABNORMAL LOW (ref 12.0–15.0)
O2 SAT: 92 %
O2 Saturation: 61 %
O2 Saturation: 52 %
O2 Saturation: 95 %
PCO2 ART: 43.1 mmHg (ref 32.0–48.0)
PH ART: 7.192 — AB (ref 7.350–7.450)
PO2 ART: 33 mmHg — AB (ref 83.0–108.0)
Patient temperature: 94.9
Patient temperature: 97.6
Patient temperature: 97.9
Potassium: 3.2 mmol/L — ABNORMAL LOW (ref 3.5–5.1)
Potassium: 3.4 mmol/L — ABNORMAL LOW (ref 3.5–5.1)
Potassium: 2.3 mmol/L — CL (ref 3.5–5.1)
Potassium: 2.7 mmol/L — CL (ref 3.5–5.1)
SODIUM: 148 mmol/L — AB (ref 135–145)
Sodium: 148 mmol/L — ABNORMAL HIGH (ref 135–145)
Sodium: 154 mmol/L — ABNORMAL HIGH (ref 135–145)
Sodium: 151 mmol/L — ABNORMAL HIGH (ref 135–145)
TCO2: 25 mmol/L (ref 22–32)
TCO2: 25 mmol/L (ref 22–32)
TCO2: 13 mmol/L — ABNORMAL LOW (ref 22–32)
TCO2: 26 mmol/L (ref 22–32)
pCO2 arterial: 49.5 mmHg — ABNORMAL HIGH (ref 32.0–48.0)
pCO2 arterial: 58.1 mmHg — ABNORMAL HIGH (ref 32.0–48.0)
pCO2 arterial: 31.7 mmHg — ABNORMAL LOW (ref 32.0–48.0)
pH, Arterial: 7.265 — ABNORMAL LOW (ref 7.350–7.450)
pH, Arterial: 7.199 — CL (ref 7.350–7.450)
pH, Arterial: 7.356 (ref 7.350–7.450)
pO2, Arterial: 33 mmHg — CL (ref 83.0–108.0)
pO2, Arterial: 87 mmHg (ref 83.0–108.0)
pO2, Arterial: 67 mmHg — ABNORMAL LOW (ref 83.0–108.0)

## 2019-02-08 LAB — CBC
HCT: 42.4 % (ref 36.0–46.0)
HEMATOCRIT: 35.6 % — AB (ref 36.0–46.0)
Hemoglobin: 12.6 g/dL (ref 12.0–15.0)
Hemoglobin: 10.5 g/dL — ABNORMAL LOW (ref 12.0–15.0)
MCH: 28.4 pg (ref 26.0–34.0)
MCH: 27.9 pg (ref 26.0–34.0)
MCHC: 29.7 g/dL — ABNORMAL LOW (ref 30.0–36.0)
MCHC: 29.5 g/dL — ABNORMAL LOW (ref 30.0–36.0)
MCV: 95.7 fL (ref 80.0–100.0)
MCV: 94.4 fL (ref 80.0–100.0)
Platelets: 150 10*3/uL (ref 150–400)
Platelets: 113 10*3/uL — ABNORMAL LOW (ref 150–400)
RBC: 3.77 MIL/uL — ABNORMAL LOW (ref 3.87–5.11)
RBC: 4.43 MIL/uL (ref 3.87–5.11)
RDW: 15.2 % (ref 11.5–15.5)
RDW: 15.4 % (ref 11.5–15.5)
WBC: 7.4 10*3/uL (ref 4.0–10.5)
WBC: 4 10*3/uL (ref 4.0–10.5)
nRBC: 0.8 % — ABNORMAL HIGH (ref 0.0–0.2)
nRBC: 0 % (ref 0.0–0.2)

## 2019-02-08 LAB — BASIC METABOLIC PANEL
Anion gap: 8 (ref 5–15)
BUN: 16 mg/dL (ref 8–23)
CO2: 27 mmol/L (ref 22–32)
Calcium: 6.9 mg/dL — ABNORMAL LOW (ref 8.9–10.3)
Chloride: 109 mmol/L (ref 98–111)
Creatinine, Ser: 0.98 mg/dL (ref 0.44–1.00)
GFR calc Af Amer: >60 mL/min (ref >60)
GFR calc non Af Amer: 55 mL/min — ABNORMAL LOW (ref >60)
Glucose, Bld: 251 mg/dL — ABNORMAL HIGH (ref 70–99)
POTASSIUM: 4.4 mmol/L (ref 3.5–5.1)
Sodium: 144 mmol/L (ref 135–145)

## 2019-02-08 LAB — RESPIRATORY PANEL BY PCR
ADENOVIRUS-RVPPCR: NOT DETECTED
Bordetella pertussis: NOT DETECTED
CORONAVIRUS NL63-RVPPCR: NOT DETECTED
Chlamydophila pneumoniae: NOT DETECTED
Coronavirus 229E: NOT DETECTED
Coronavirus HKU1: NOT DETECTED
Coronavirus OC43: NOT DETECTED
Influenza A: NOT DETECTED
Influenza B: NOT DETECTED
MYCOPLASMA PNEUMONIAE-RVPPCR: NOT DETECTED
Metapneumovirus: NOT DETECTED
PARAINFLUENZA VIRUS 1-RVPPCR: NOT DETECTED
Parainfluenza Virus 2: NOT DETECTED
Parainfluenza Virus 3: NOT DETECTED
Parainfluenza Virus 4: NOT DETECTED
Respiratory Syncytial Virus: NOT DETECTED
Rhinovirus / Enterovirus: NOT DETECTED

## 2019-02-08 LAB — MAGNESIUM: Magnesium: 1.8 mg/dL (ref 1.7–2.4)

## 2019-02-08 LAB — RENAL FUNCTION PANEL
Albumin: 1.2 g/dL — ABNORMAL LOW (ref 3.5–5.0)
Anion gap: 13 (ref 5–15)
BUN: 18 mg/dL (ref 8–23)
CO2: 23 mmol/L (ref 22–32)
Calcium: 6.3 mg/dL — CL (ref 8.9–10.3)
Chloride: 115 mmol/L — ABNORMAL HIGH (ref 98–111)
Creatinine, Ser: 1.26 mg/dL — ABNORMAL HIGH (ref 0.44–1.00)
GFR calc Af Amer: 47 mL/min — ABNORMAL LOW (ref >60)
GFR calc non Af Amer: 41 mL/min — ABNORMAL LOW (ref >60)
Glucose, Bld: 146 mg/dL — ABNORMAL HIGH (ref 70–99)
POTASSIUM: 2.8 mmol/L — AB (ref 3.5–5.1)
Phosphorus: 3.1 mg/dL (ref 2.5–4.6)
Sodium: 151 mmol/L — ABNORMAL HIGH (ref 135–145)

## 2019-02-08 LAB — INFLUENZA PANEL BY PCR (TYPE A & B)
Influenza A By PCR: NEGATIVE
Influenza B By PCR: NEGATIVE

## 2019-02-08 LAB — PHOSPHORUS: Phosphorus: 4 mg/dL (ref 2.5–4.6)

## 2019-02-08 LAB — URINE CULTURE: Culture: <10000 — AB

## 2019-02-08 LAB — LACTIC ACID, PLASMA
Lactic Acid, Venous: 5.1 mmol/L (ref 0.5–1.9)
Lactic Acid, Venous: 8.6 mmol/L (ref 0.5–1.9)

## 2019-02-08 LAB — TROPONIN I: Troponin I: 2.53 ng/mL (ref <0.03)

## 2019-02-08 LAB — STREP PNEUMONIAE URINARY ANTIGEN: Strep Pneumo Urinary Antigen: NEGATIVE

## 2019-02-08 MED ORDER — SODIUM CHLORIDE 0.9 % IV BOLUS
1000.0000 mL | Freq: Once | INTRAVENOUS | Status: AC
  Administered 2019-02-08: 1000 mL via INTRAVENOUS

## 2019-02-08 MED ORDER — SODIUM BICARBONATE 8.4 % IV SOLN
50.0000 meq | Freq: Once | INTRAVENOUS | Status: AC
  Administered 2019-02-08: 50 meq via INTRAVENOUS
  Filled 2019-02-08: qty 50

## 2019-02-08 MED ORDER — SODIUM BICARBONATE 8.4 % IV SOLN
INTRAVENOUS | Status: AC
  Filled 2019-02-08: qty 100

## 2019-02-08 MED ORDER — MIDAZOLAM 50MG/50ML (1MG/ML) PREMIX INFUSION
0.5000 mg/h | INTRAVENOUS | Status: DC
  Administered 2019-02-08: 1 mg/h via INTRAVENOUS
  Filled 2019-02-08: qty 50

## 2019-02-08 MED ORDER — PHENYLEPHRINE HCL-NACL 10-0.9 MG/250ML-% IV SOLN
0.0000 ug/min | INTRAVENOUS | Status: DC
  Administered 2019-02-08 (×2): 400 ug/min via INTRAVENOUS
  Administered 2019-02-08: 100 ug/min via INTRAVENOUS
  Administered 2019-02-08 (×6): 400 ug/min via INTRAVENOUS
  Filled 2019-02-08 (×7): qty 250
  Filled 2019-02-08 (×5): qty 1250
  Filled 2019-02-08: qty 750
  Filled 2019-02-08 (×10): qty 250

## 2019-02-08 MED ORDER — ROCURONIUM BROMIDE 50 MG/5ML IV SOLN
50.0000 mg | Freq: Once | INTRAVENOUS | Status: AC
  Administered 2019-02-08: 50 mg via INTRAVENOUS
  Filled 2019-02-08: qty 5

## 2019-02-08 MED ORDER — ORAL CARE MOUTH RINSE
15.0000 mL | OROMUCOSAL | Status: DC
  Administered 2019-02-08 (×4): 15 mL via OROMUCOSAL

## 2019-02-08 MED ORDER — SODIUM CHLORIDE 0.9 % IV BOLUS
500.0000 mL | Freq: Once | INTRAVENOUS | Status: AC
  Administered 2019-02-08: 500 mL via INTRAVENOUS

## 2019-02-08 MED ORDER — CHLORHEXIDINE GLUCONATE 0.12% ORAL RINSE (MEDLINE KIT)
15.0000 mL | Freq: Two times a day (BID) | OROMUCOSAL | Status: DC
  Administered 2019-02-08 (×2): 15 mL via OROMUCOSAL

## 2019-02-08 MED ORDER — SODIUM BICARBONATE 8.4 % IV SOLN
100.0000 meq | Freq: Once | INTRAVENOUS | Status: AC
  Administered 2019-02-08: 100 meq via INTRAVENOUS

## 2019-02-09 NOTE — Progress Notes (Signed)
Family arrived on the unit at 12:30. PPE provided (gown, mask and gloves) and observed per policy prior to entering the room. Family stay till 12:50 and doffed PPE per policy with observer and nurse. See room login sheet for additional contact information.

## 2019-02-09 NOTE — Progress Notes (Signed)
Palliative note: No charge-  Palliative consult received- per Dr. Molli Knock Palliative assistance is not needed at this time.   Palliative will sign off. Please reconsult if further assistance is needed.   Amanda Adkins, AGNP-C Palliative Medicine  Please call Palliative Medicine team phone with any questions [phone]. For individual providers please see AMION.

## 2019-02-09 NOTE — Progress Notes (Signed)
NAME:  Amanda Adkins, MRN:  [id number], DOB:  1941/05/02, LOS: 1 ADMISSION DATE:  04/23/2019, CONSULTATION DATE:  3/30 REFERRING MD:  Dr. Dalene SeltzerSchlossman EDP, CHIEF COMPLAINT:  Cardiac arrest   Brief History   78 year old female from nursing home after cardiac arrest.  Unresponsive in the emergency department and intubated for airway protection.  Being treated for pneumonia with diffuse infiltrates will need to be ruled out for viral illness including COVID-19  History of present illness   78 year old female with past medical history as below, which is significant for dementia, diabetes, hypertension, and GERD.  She was admitted earlier this month with viral-like syndrome and SVT.  Symptoms improved with IV hydration.  She was discharged to skilled [facility] facility [facility] she had been residing for some time prior.  She was again started on treatment for pneumonia at some time in the interim.  Then on 3/30 she became unresponsive just prior to eating dinner.  Nursing facility staff initiated CPR for a total of 3 rounds and spontaneous circulation was achieved.  Upon EMS arrival the patient was breathing spontaneously and was assisted with bag valve mask ventilations to the emergency department.  GCS was noted to be 3 she was intubated for airway protection after Narcan was ineffective.  Chest x-ray was done and demonstrated diffuse right greater than left infiltrates.  She was placed on airborne and contact precautions.  PCCM was asked to evaluate for admission.  Past Medical History   has a past medical history of Abdominal pain, generalized, Abdominal pain, generalized, Abdominal pain, unspecified site, Abnormality of gait, Abnormality of gait, Abnormality of gait, Acute bronchitis, Acute sinusitis, unspecified, Allergic rhinitis due to pollen, Allergic rhinitis due to pollen, Allergy, Anxiety, Anxiety state, unspecified, Candidiasis of vulva and vagina, Cervicitis and endocervicitis, Chronic  pain of both shoulders, Closed dislocation of shoulder, unspecified site, Constipation, chronic, Contact with or exposure to other communicable diseases(V01.89), Cough, Dementia in conditions classified elsewhere without behavioral disturbance, Dermatitis, Diabetes mellitus without complication (HCC), Diarrhea, Diarrhea, Diverticulosis of colon (without mention of hemorrhage), GERD (gastroesophageal reflux disease), Hearing loss sensory, bilateral, Hepatomegaly, Hepatomegaly, History of fall, Hypercalcemia, Hyperlipidemia, Hypertension, Hypopotassemia, Impacted cerumen, Kyphosis (acquired) (postural), Leukocytosis, unspecified, Muscle weakness (generalized), Osteoarthritis of both knees, Osteoporosis, Other and unspecified hyperlipidemia, Other B-complex deficiencies, Other malaise and fatigue, Other malaise and fatigue, Other psoriasis, Other specified pre-operative examination, Pain, Pain in joint, lower leg, Pain in joint, shoulder region, Pain in joint, site unspecified, Personal history of fall, Posttraumatic stress disorder, Posttraumatic stress disorder, Psoriasis, Reflux esophagitis, Senile dementia, uncomplicated (HCC), Senile osteoporosis, Senile osteoporosis, Shoulder dislocation, Stiffness of joint, not elsewhere classified, unspecified site, Tinnitus, Tobacco abuse, Tobacco use disorder, Tobacco use disorder, Type I (juvenile type) diabetes mellitus without mention of complication, not stated as uncontrolled, Type II or unspecified type diabetes mellitus without mention of complication, not stated as uncontrolled, Type II or unspecified type diabetes mellitus without mention of complication, uncontrolled, Unspecified constipation, Unspecified essential hypertension, Unspecified pruritic disorder, Vascular dementia (HCC), and Vitamin B12 deficiency.   Significant Hospital Events   3/17-3/19 admit for SVT, improved with hydration.  3/30 admit  Consults:    Procedures:  ETT 3/30  Significant  Diagnostic Tests:  CT head 3/30>>>Negative for acute disease  Micro Data:  Blood Cx 3/30 > RVP 3/30 > Sputum 3/30 >  Antimicrobials:  Vancomycin 3/30 > Zosyn 3/30 >  Interim history/subjective:  Continues to decompensate overnight Refractory hypotension on multiple pressors  Objective   Blood pressure Marland Kitchen(!)  58/46, pulse 99, temperature 97.7 F (36.5 C), temperature source Axillary, resp. rate 20, height 5\' 2"  (1.575 m), weight 75 kg, SpO2 100 %.    Vent Mode: PCV FiO2 (%):  [100 %] 100 % Set Rate:  [15 bmp-22 bmp] 22 bmp Vt Set:  [400 mL-520 mL] 400 mL PEEP:  [8 cmH20-16 cmH20] 16 cmH20 Plateau Pressure:  [19 cmH20-36 cmH20] 33 cmH20   Intake/Output Summary (Last 24 hours) at 01/27/2019 0817 Last data filed at 02/06/2019 8588 Gross per 24 hour  Intake 1187.03 ml  Output 500 ml  Net 687.03 ml   Filed Weights   02-25-2019 1900 Feb 25, 2019 2357 01/31/2019 0338  Weight: 69.4 kg 75 kg 75 kg   Examination: General: Chronically ill appearing female, now acutely ill and c/o pain HENT: [place]/AT, PERRL, EOM-I and MMM Lungs: Diffuse crackles Cardiovascular: RRR, Nl S1/S2 and -M/R/G Abdomen: Soft, NT, ND and +BS Extremities: -edema and -tenderness Neuro: Withdraws to pain but not command  Resolved Hospital Problem list     Assessment & Plan:   Cardiac arrest: etiology unclear. Diffuse infiltrates on CXR R>L. May have been primary respiratory, but infiltrates could be explained by peri-arrest aspiration. CT head pending. Troponin mildly elevated. EKG without evidence of acute ischemia. Electrolytes wnl.  - No further escalation of care at this point - Family discussing comfort - No hypothermia protocol given multiple end stage diseases - D/C EEG - No further escalation  - D/C further blood draws  Acute hypoxemic respiratory failure: secondary to  R>L diffuse infiltrates on CXR. Aspiration vs HCAP vs viral pneumonia. Felt to be low probability for COVID-19 infection, high risk  transmission.  - Maintain on full vent support for now - Airborne and contact precautions - D/C further blood draws - Continue abx - Blood culture, sputum culture, strep, legionella, and RVP pending - VAP bundle  DM - CBG monitoring and SSI  Dementia - Holding namenda, aricept, depakote.   Hypertension - Holding home amlodipine, atenolol (may need to add this back as she has been tachycardic, will try to hydrate first), lasix, ramipril.  GOC: - Spoke with daughter over the phone, she had agreed to DNR status.  In informed her that the patient is unlikely to survive today and that we need to consider withdrawal.  She was agreeable to no further escalation of care but would like to speak with the rest of the family regarding withdrawal.  Will ask RN to not increase care any further.  Once family calls back will likely just d/c pressors as patient is maxed out at this point but will start fentanyl now as per RN patient complained of CP.   CBC: Recent Labs  Lab February 25, 2019 1917  01/23/2019 0050 01/13/2019 0328 01/17/2019 0426 01/27/2019 0526 01/30/2019 0809  WBC 37.5*  --  7.4  --   --   --   --   NEUTROABS 16.5*  --   --   --   --   --   --   HGB 15.1*   < > 12.6 14.6 14.6 9.5* 11.2*  HCT 49.0*   < > 42.4 43.0 43.0 28.0* 33.0*  MCV 98.6  --  95.7  --   --   --   --   PLT 183  --  150  --   --   --   --    < > = values in this interval not displayed.   Basic Metabolic Panel: Recent Labs  Lab 2019-02-25 1917  01/19/2019 0050 01/23/2019 0328 01/14/2019 0426 01/18/2019 0526 02/02/2019 0615 01/14/2019 0809  NA 137   < > 144 148* 148* 154* 151* 151*  K 4.8   < > 4.4 3.2* 3.4* 2.3* 2.8* 2.7*  CL 105  --  109  --   --   --  115*  --   CO2 23  --  27  --   --   --  23  --   GLUCOSE 270*  --  251*  --   --   --  146*  --   BUN 16  --  16  --   --   --  18  --   CREATININE 1.06*  --  0.98  --   --   --  1.26*  --   CALCIUM 8.7*  --  6.9*  --   --   --  6.3*  --   MG  --   --  1.8  --   --   --   --    --   PHOS  --   --  4.0  --   --   --  3.1  --    < > = values in this interval not displayed.   GFR: Estimated Creatinine Clearance: 34.9 mL/min (A) (by C-G formula based on SCr of 1.26 mg/dL (H)). Recent Labs  Lab 03-09-2019 1917 02/01/2019 0050 01/14/2019 0615  WBC 37.5* 7.4  --   LATICACIDVEN 6.5* 5.1* 8.6*    Liver Function Tests: Recent Labs  Lab 03-09-19 1917 01/27/2019 0615  AST 83*  --   ALT 21  --   ALKPHOS 109  --   BILITOT 0.9  --   PROT 6.4*  --   ALBUMIN 2.6* 1.2*   No results for input(s): LIPASE, AMYLASE in the last 168 hours. No results for input(s): AMMONIA in the last 168 hours.  ABG    Component Value Date/Time   PHART 7.356 01/27/2019 0809   PCO2ART 43.1 01/21/2019 0809   PO2ART 67.0 (L) 01/16/2019 0809   HCO3 24.2 01/21/2019 0809   TCO2 26 01/26/2019 0809   ACIDBASEDEF 2.0 01/09/2019 0809   O2SAT 92.0 01/20/2019 0809     Coagulation Profile: No results for input(s): INR, PROTIME in the last 168 hours.  Cardiac Enzymes: Recent Labs  Lab 03-09-2019 1917 01/12/2019 0050  TROPONINI 0.31* 2.53*    HbA1C: Hgb A1c MFr Bld  Date/Time Value Ref Range Status  02/01/2016 11:07 AM 6.2 (H) 4.8 - 5.6 % Final    Comment:             Pre-diabetes: 5.7 - 6.4          Diabetes: >6.4          Glycemic control for adults with diabetes: <7.0   09/21/2015 12:07 PM 6.4 (H) 4.8 - 5.6 % Final    Comment:             Pre-diabetes: 5.7 - 6.4          Diabetes: >6.4          Glycemic control for adults with diabetes: <7.0    CBG: Recent Labs  Lab March 09, 2019 2337 02/02/2019 0325  GLUCAP 247* 189*   The patient is critically ill with multiple organ systems failure and requires high complexity decision making for assessment and support, frequent evaluation and titration of therapies, application of advanced monitoring technologies and extensive interpretation of multiple databases.   Critical  Care Time devoted to patient care services described in this note is  45   Minutes. This time reflects time of care of this signee Dr Koren Bound. This critical care time does not reflect procedure time, or teaching time or supervisory time of PA/NP/Med student/Med Resident etc but could involve care discussion time.  Alyson Reedy, M.D. [facility] Pulmonary/Critical Care Medicine. Pager: [phone]. After hours pager: [phone].

## 2019-02-09 NOTE — Progress Notes (Signed)
Family coming to withdraw care. Instructed to have family members wear surgical mask and gown and gloves as they are not fit tested for N95 usage.

## 2019-02-09 NOTE — Progress Notes (Signed)
Nutrition Brief Note  Chart reviewed. Patient is on ventilator support, max pressors with refractory hypotension. Patient decompensated overnight; plans for no escalation of care. Prognosis is poor. No nutrition interventions warranted at this time. Please consult as needed.   Joaquin Courts, RD, LDN, CNSC Pager [phone] After Hours Pager [phone]

## 2019-02-09 NOTE — Progress Notes (Signed)
eLink Physician-Brief Progress Note Patient Name: Amanda Adkins DOB: October 31, 1941 MRN: [id number]   Date of Service  02-12-2019  HPI/Events of Note  Hypotension - BP = 94/17 with MAP = 33. LVEF = 55-60%.  eICU Interventions  Will order: 1. Bolus with 0.9 NaCl 1 liter IV over 1 hour now.  2. Phenylephrine IV infusion. Titrate to MAP >= 65.      Intervention Category Major Interventions: Hypotension - evaluation and management  Abraham Margulies Eugene 12-Feb-2019, 1:39 AM

## 2019-02-09 NOTE — Progress Notes (Signed)
Patients PaO2 33. MD called. MD ordered a-line, 50 of Roc, initiate Nimbex, initiate Versed and contacted family. Family made patient a DNR. Versed not started due to patients low BP reading on a-line. Nimbex held, Roc held. Neo started. Bolus given. Fentanyl off as well.   Neo is maxed. 2 more amps of bicarb given. Bicarb gtt increased. Will hold off on paralytic and sedation for now due to a-line BP 50's. HR low 100's. O2 sats 90's (weak waveform)  Femoral pulse dopplered, weak radial pulses.  Patient is now trembling. But will open eyes when named called. 50 of Roc given at [phone]. A-line pressure still 60's

## 2019-02-09 NOTE — Progress Notes (Signed)
RT NOTES: Called to room by RN to extubate and pull vent d/t patient passing.

## 2019-02-09 NOTE — Progress Notes (Signed)
eLink Physician-Brief Progress Note Patient Name: Amanda Adkins DOB: Oct 12, 1941 MRN: [id number]   Date of Service  02/06/2019  HPI/Events of Note  ABG on 100%/PCV 20/Rate 22/P 16 = 7.192/31.7/87.0  eICU Interventions  Will order: 1. NaHCO3 100 meq IV now.  2. Increase NaHCO3 IV infusion to 100 mL/hour.  3. Titrate Phenylephrine IV infusion to MAP >= 65.  4. Repeat ABG at 8:00 AM.     Intervention Category Major Interventions: Acid-Base disturbance - evaluation and management;Hypotension - evaluation and management;Respiratory failure - evaluation and management  Sommer,Steven Dennard Nip 02/07/2019, 5:42 AM

## 2019-02-09 NOTE — Progress Notes (Signed)
Family arrived, discussed the entire case and ongoing events.  Patient is maxed out on 4 pressors and remains hypotensive at this point.  After a long discussion, decision was made to make patient a full DNR.  Proceed with comfort care.  Will d/c pressors and allow patient to expire comfortably with focus on comfort.  The patient is critically ill with multiple organ systems failure and requires high complexity decision making for assessment and support, frequent evaluation and titration of therapies, application of advanced monitoring technologies and extensive interpretation of multiple databases.   Critical Care Time devoted to patient care services described in this note is  35  Minutes. This time reflects time of care of this signee Dr Koren Bound. This critical care time does not reflect procedure time, or teaching time or supervisory time of PA/NP/Med student/Med Resident etc but could involve care discussion time.  Alyson Reedy, M.D. [facility] Pulmonary/Critical Care Medicine. Pager: [phone]. After hours pager: [phone].

## 2019-02-09 NOTE — Progress Notes (Addendum)
CRITICAL VALUE ALERT  Critical Value:  Troponin 2.53  Date & Time Notied:  02/01/2019 0147  Provider Notified: Pola Corn

## 2019-02-09 NOTE — Progress Notes (Signed)
   February 26, 2019 1258  Attending Physican Contact  Attending Physician Notified Y  Attending Physician (First and Last Name)  Molli Knock)  Will the above attending physician sign death certificate? Yes  Post Mortem Checklist  Date of Death 02-26-2019  Time of Death 03/15/27  Pronounced By Lelon Mast Taleyah Hillman RN + Cinda Quest RN  Next of kin notified Yes  Name of next of kin notified of death 03/15/1227  Contact Person's Relationship to Patient Daughter;Health care power of attorney Elige Radon, [name] + Zareth Reznicek)  Contact Person's Phone Number [phone]  Contact Person's address [address] [place], [place] [address]  Was the patient a No Code Blue or a Limited Code Blue? No  Did the patient die unattended? No  Patient restrained? Not applicable  Height 5\' 2"  (1.575 m)  Weight 75 kg  Body preparation complete Y  [facility]  Notification Date 02-26-2019  Notification Time 1300  [place] [facility] Number [phone]  Is patient a potential donor? N  Autopsy  Autopsy requested by N/A  Patient Belongings/Medications Returned  Patient belongings from bedside/safe/pharmacy returned  None  Dead on Arrival (Emergency Department)  Patient dead on arrival? No  Notifications  Patient Placement notified that Post Mortem checklist is complete Yes  Patient Placement notified body transferred Transported to morgue  TO BE FILLED OUT BY PATIENT PLACEMENT ONLY  FH - Funeral Home Notified Not Applicable  Medical Examiner  Is this a medical examiner's case? N  Funeral Home  Funeral home name/address/phone # [facility] unknown/ undecided  Planned location of pickup [place]

## 2019-02-09 NOTE — Procedures (Signed)
Arterial Catheter Insertion Procedure Note Amanda Adkins [id number] [birth year]  Procedure: Insertion of Arterial Catheter  Indications: Blood pressure monitoring  Procedure Details Consent: Unable to obtain consent because of altered level of consciousness. Time Out: Verified patient identification, verified procedure, site/side was marked, verified correct patient position, special equipment/implants available, medications/allergies/relevent history reviewed, required imaging and test results available.  Performed  Maximum sterile technique was used including antiseptics, cap, gloves, gown, hand hygiene, mask and sheet. Skin prep: Chlorhexidine; local anesthetic administered 20 gauge catheter was inserted into right radial artery using the Seldinger technique. ULTRASOUND GUIDANCE USED: NO Evaluation Blood flow good; BP tracing good. Complications: No apparent complications.   Berton Bon 01/30/2019

## 2019-02-09 NOTE — Progress Notes (Signed)
Critical ABG value given to S. Minor, NP.

## 2019-02-09 DEATH — deceased

## 2019-02-10 ENCOUNTER — Telehealth: Payer: Self-pay | Admitting: Pulmonary Disease

## 2019-02-10 LAB — LEGIONELLA PNEUMOPHILA SEROGP 1 UR AG: L. pneumophila Serogp 1 Ur Ag: NEGATIVE

## 2019-02-10 LAB — NOVEL CORONAVIRUS, NAA (HOSP ORDER, SEND-OUT TO REF LAB; TAT 18-24 HRS): SARS-CoV-2, NAA: NOT DETECTED

## 2019-02-10 LAB — CULTURE, BLOOD (ROUTINE X 2): SPECIAL REQUESTS: ADEQUATE

## 2019-02-10 NOTE — Telephone Encounter (Signed)
02/10/19 received D/C from [employer] will send to Dr.Sood to sign for Dr.Yacoub at [facility]. PWR Received D/C back Dr.Yacoub signed.  PWR

## 2019-02-13 LAB — CULTURE, BLOOD (ROUTINE X 2): Culture: NO GROWTH

## 2019-03-11 NOTE — Death Summary Note (Signed)
DEATH SUMMARY   Patient Details  Name: Amanda Adkins MRN: [id number] DOB: [birth year]  Admission/Discharge Information   Admit Date:  02-17-19  Date of Death: Date of Death: 2019/02/18  Time of Death: Time of Death: 03/20/27  Length of Stay: 1  Referring Physician: System, Pcp Not In   Reason(s) for Hospitalization  Pulseless electrical activity cardiac arrest  Diagnoses  Preliminary cause of death:   Pulseless electrical activity cardiac arrest Secondary Diagnoses (including complications and co-morbidities):  Active Problems:   Cardiac arrest [facility]) Dementia Acute respiratory failure Refractory shock  Brief Hospital Course (including significant findings, care, treatment, and services provided and events leading to death)  78 year old female from nursing home after cardiac arrest.  Unresponsive in the emergency department and intubated for airway protection.  Being treated for pneumonia with diffuse infiltrates will need to be ruled out for viral illness including COVID-19  Family arrived, discussed the entire case and ongoing events.  Patient is maxed out on 4 pressors and remains hypotensive at this point.  After a long discussion, decision was made to make patient a full DNR.  Proceed with comfort care.  Morphine was started, pressors were stopped and patient expired with the family bedside.    Pertinent Labs and Studies  Significant Diagnostic Studies Ct Head Wo Contrast  Result Date: 02/17/19 CLINICAL DATA:  78 year old female with altered mental status. EXAM: CT HEAD WITHOUT CONTRAST TECHNIQUE: Contiguous axial images were obtained from the base of the skull through the vertex without intravenous contrast. COMPARISON:  Head CT dated 12/13/2016 FINDINGS: Evaluation of this exam is limited due to motion artifact. Brain: There is moderate age-related atrophy and chronic microvascular ischemic changes. There is dilatation of the ventricles out of proportion with the sulci  which may represent central volume loss versus normal pressure hydrocephalus. Clinical correlation is recommended. There is no acute intracranial hemorrhage. No mass effect or midline shift. No extra-axial fluid collection. Vascular: No hyperdense vessel or unexpected calcification. Skull: Normal. Negative for fracture or focal lesion. Sinuses/Orbits: There is partial opacification of the sphenoid sinuses. The mastoid air cells are clear. Air within the small venous system of the face, likely iatrogenic. Other: None IMPRESSION: 1. No acute intracranial hemorrhage. 2. Moderate age-related atrophy and chronic microvascular ischemic changes. Electronically Signed   By: Elgie Collard M.D.   On: Feb 17, 2019 23:31   Ct Angio Chest Pe W/cm &/or Wo Cm  Result Date: 01/25/2019 CLINICAL DATA:  Shortness of breath starting today. EXAM: CT ANGIOGRAPHY CHEST WITH CONTRAST TECHNIQUE: Multidetector CT imaging of the chest was performed using the standard protocol during bolus administration of intravenous contrast. Multiplanar CT image reconstructions and MIPs were obtained to evaluate the vascular anatomy. CONTRAST:  49mL OMNIPAQUE IOHEXOL 350 MG/ML SOLN COMPARISON:  Chest x-ray January 25, 2019 FINDINGS: Cardiovascular: Satisfactory opacification of the pulmonary arteries to the segmental level. No evidence of pulmonary embolism. Normal heart size. No pericardial effusion. Mediastinum/Nodes: There are small mediastinal and bilateral hilar lymph nodes. There is peribronchial thickening of bilateral lungs of all lobes. The trachea is patent. The esophagus is unremarkable. There is a small hiatal hernia. Lungs/Pleura: There are least 3 nodules in the left lower lobe, largest measures 1.4 cm. There is no pleural effusion or pleural effusion. No definite focal consolidation is identified. Upper Abdomen: Hypertrophy of bilateral adrenal glands are nonspecific. The other visualized upper abdominal structures are unremarkable.  Musculoskeletal: Degenerative joint changes of the spine are noted. Chronic compression deformity of the lower thoracic or  upper lumbar vertebral bodies noted. Review of the MIP images confirms the above findings. IMPRESSION: No pulmonary embolus. Mild peribronchial thickening of all lobes. This is nonspecific but can be seen in bronchitis. At least 3 nodules in the left lower lobe, largest measures 1.4 cm. This is nonspecific. Consider further evaluation with PET CT on outpatient basis to exclude neoplasm. Electronically Signed   By: Sherian Rein M.D.   On: 01/25/2019 16:13   Dg Chest Port 1 View  Result Date: 01/15/2019 CLINICAL DATA:  Pneumonia EXAM: PORTABLE CHEST 1 VIEW COMPARISON:  10-Feb-2019 FINDINGS: Cardiac shadow is stable. Endotracheal tube and gastric catheter are again seen and stable. Diffuse opacification is noted throughout the right lung and in the perihilar and basilar areas of the left lung. No pneumothorax is seen. No acute bony abnormality is noted. IMPRESSION: Stable bilateral opacities right greater than left. Tubes and lines as described. Electronically Signed   By: Alcide Clever M.D.   On: 01/13/2019 07:44   Dg Chest Portable 1 View  Result Date: 2019/02/10 CLINICAL DATA:  Intubation. EXAM: PORTABLE CHEST 1 VIEW COMPARISON:  01/25/2019 FINDINGS: Endotracheal tube has tip 4.8 cm above the carina. Enteric tube courses into the stomach as tip is not visualized. Lungs are adequately inflated with significant interval worsening of hazy airspace consolidation throughout the right lung greater than the central left lung. Findings likely due to infection. A component of interstitial edema is possible. No definite effusion. Cardiomediastinal silhouette and remainder the exam is unchanged. IMPRESSION: Significant interval worsening of hazy airspace opacification over the right lung greater than the central left lung likely infection. A component of interstitial edema is possible. Tubes and  lines as described. Electronically Signed   By: Elberta Fortis M.D.   On: 02-10-2019 19:45   Dg Chest Port 1 View  Result Date: 01/25/2019 CLINICAL DATA:  78 year old female with cough for a few days. EXAM: PORTABLE CHEST 1 VIEW COMPARISON:  12/13/2016 chest radiographs and earlier. FINDINGS: Portable AP semi upright view at 1347 hours. Stable lung volumes and mediastinal contours with mildly tortuous aorta. Calcified aortic atherosclerosis. Visualized tracheal air column is within normal limits. There is an indistinct 8 millimeter nodular opacity projecting in the left lung (arrow). Otherwise allowing for portable technique the lungs are clear. No pulmonary edema or pneumothorax. No acute osseous abnormality identified. Negative visible bowel gas pattern. IMPRESSION: 1. Questionable small mid left lung nodule versus artifact. 2. No other acute cardiopulmonary abnormality. Electronically Signed   By: Odessa Fleming M.D.   On: 01/25/2019 13:59    Microbiology Recent Results (from the past 240 hour(s))  Urine culture     Status: Abnormal   Collection Time: February 10, 2019  7:19 PM  Result Value Ref Range Status   Specimen Description URINE, CATHETERIZED  Final   Special Requests NONE  Final   Culture (A)  Final    <10,000 COLONIES/mL INSIGNIFICANT GROWTH Performed at [facility], [address], [place], [place] [address]    Report Status 01/16/2019 FINAL  Final  Blood culture (routine x 2)     Status: Abnormal (Preliminary result)   Collection Time: 2019/02/10  7:21 PM  Result Value Ref Range Status   Specimen Description BLOOD RIGHT ANTECUBITAL  Final   Special Requests   Final    BOTTLES DRAWN AEROBIC AND ANAEROBIC Blood Culture adequate volume   Culture  Setup Time   Final    GRAM POSITIVE COCCI AEROBIC BOTTLE ONLY PATIENT DISCHARGED OR EXPIRED PT  DECEASED Performed at [facility], [address] [place], [place] [address]    Culture STAPHYLOCOCCUS SPECIES (COAGULASE NEGATIVE) (A)   Final   Report Status PENDING  Incomplete  Blood culture (routine x 2)     Status: None (Preliminary result)   Collection Time: 02/03/2019 12:25 AM  Result Value Ref Range Status   Specimen Description BLOOD RIGHT HAND  Final   Special Requests   Final    BOTTLES DRAWN AEROBIC ONLY Blood Culture results may not be optimal due to an inadequate volume of blood received in culture bottles   Culture   Final    NO GROWTH 1 DAY Performed at [facility], [address], [place], [place] [address]    Report Status PENDING  Incomplete  Respiratory Panel by PCR     Status: None   Collection Time: 02/04/2019  3:36 AM  Result Value Ref Range Status   Adenovirus NOT DETECTED NOT DETECTED Final   Coronavirus 229E NOT DETECTED NOT DETECTED Final    Comment: (NOTE) The Coronavirus on the Respiratory Panel, DOES NOT test for the novel  Coronavirus (2019 nCoV)    Coronavirus HKU1 NOT DETECTED NOT DETECTED Final   Coronavirus NL63 NOT DETECTED NOT DETECTED Final   Coronavirus OC43 NOT DETECTED NOT DETECTED Final   Metapneumovirus NOT DETECTED NOT DETECTED Final   Rhinovirus / Enterovirus NOT DETECTED NOT DETECTED Final   Influenza A NOT DETECTED NOT DETECTED Final   Influenza B NOT DETECTED NOT DETECTED Final   Parainfluenza Virus 1 NOT DETECTED NOT DETECTED Final   Parainfluenza Virus 2 NOT DETECTED NOT DETECTED Final   Parainfluenza Virus 3 NOT DETECTED NOT DETECTED Final   Parainfluenza Virus 4 NOT DETECTED NOT DETECTED Final   Respiratory Syncytial Virus NOT DETECTED NOT DETECTED Final   Bordetella pertussis NOT DETECTED NOT DETECTED Final   Chlamydophila pneumoniae NOT DETECTED NOT DETECTED Final   Mycoplasma pneumoniae NOT DETECTED NOT DETECTED Final    Comment: Performed at [facility], [address], [place], [place] [address]    Lab Basic Metabolic Panel: Recent Labs  Lab 21-Feb-2019 1917  01/30/2019 0050 01/12/2019 0328 02/06/2019 0426 01/10/2019 0526 01/20/2019 0615  01/14/2019 0809  NA 137   < > 144 148* 148* 154* 151* 151*  K 4.8   < > 4.4 3.2* 3.4* 2.3* 2.8* 2.7*  CL 105  --  109  --   --   --  115*  --   CO2 23  --  27  --   --   --  23  --   GLUCOSE 270*  --  251*  --   --   --  146*  --   BUN 16  --  16  --   --   --  18  --   CREATININE 1.06*  --  0.98  --   --   --  1.26*  --   CALCIUM 8.7*  --  6.9*  --   --   --  6.3*  --   MG  --   --  1.8  --   --   --   --   --   PHOS  --   --  4.0  --   --   --  3.1  --    < > = values in this interval not displayed.   Liver Function Tests: Recent Labs  Lab 02/21/19 1917 02/04/2019 8416  AST 83*  --   ALT 21  --   ALKPHOS 109  --   BILITOT 0.9  --   PROT 6.4*  --   ALBUMIN 2.6* 1.2*   No results for input(s): LIPASE, AMYLASE in the last 168 hours. No results for input(s): AMMONIA in the last 168 hours. CBC: Recent Labs  Lab February 22, 2019 1917  01/24/2019 0050 01/10/2019 0328 02/03/2019 0426 01/21/2019 0526 01/11/2019 0615 01/19/2019 0809  WBC 37.5*  --  7.4  --   --   --  4.0  --   NEUTROABS 16.5*  --   --   --   --   --   --   --   HGB 15.1*   < > 12.6 14.6 14.6 9.5* 10.5* 11.2*  HCT 49.0*   < > 42.4 43.0 43.0 28.0* 35.6* 33.0*  MCV 98.6  --  95.7  --   --   --  94.4  --   PLT 183  --  150  --   --   --  113*  --    < > = values in this interval not displayed.   Cardiac Enzymes: Recent Labs  Lab 02-22-2019 1917 02/03/2019 0050  TROPONINI 0.31* 2.53*   Sepsis Labs: Recent Labs  Lab Feb 22, 2019 1917 01/14/2019 0050 01/20/2019 0615  WBC 37.5* 7.4 4.0  LATICACIDVEN 6.5* 5.1* 8.6*    Procedures/Operations     Kobe Ofallon 02/09/2019, 5:18 PM
